# Patient Record
Sex: Male | Born: 1975 | Race: White | Hispanic: No | Marital: Married | Smoking: Never smoker
Health system: Southern US, Community
[De-identification: ages and names within clinical notes are randomized; demographics above are authoritative.]

## PROBLEM LIST (undated history)

## (undated) DIAGNOSIS — M109 Gout, unspecified: Secondary | ICD-10-CM

## (undated) DIAGNOSIS — J4520 Mild intermittent asthma, uncomplicated: Secondary | ICD-10-CM

## (undated) DIAGNOSIS — E669 Obesity, unspecified: Secondary | ICD-10-CM

## (undated) DIAGNOSIS — G4730 Sleep apnea, unspecified: Secondary | ICD-10-CM

## (undated) DIAGNOSIS — G4700 Insomnia, unspecified: Secondary | ICD-10-CM

## (undated) DIAGNOSIS — Z9989 Dependence on other enabling machines and devices: Secondary | ICD-10-CM

## (undated) DIAGNOSIS — R7401 Elevation of levels of liver transaminase levels: Secondary | ICD-10-CM

## (undated) DIAGNOSIS — S76319A Strain of muscle, fascia and tendon of the posterior muscle group at thigh level, unspecified thigh, initial encounter: Secondary | ICD-10-CM

## (undated) DIAGNOSIS — M1990 Unspecified osteoarthritis, unspecified site: Secondary | ICD-10-CM

## (undated) DIAGNOSIS — E785 Hyperlipidemia, unspecified: Secondary | ICD-10-CM

## (undated) DIAGNOSIS — E66812 Obesity, class 2: Secondary | ICD-10-CM

## (undated) HISTORY — DX: Obesity, class 2: E66.812

## (undated) HISTORY — DX: Hyperlipidemia, unspecified: E78.5

## (undated) HISTORY — DX: Obesity, unspecified: E66.9

## (undated) HISTORY — DX: Insomnia, unspecified: G47.00

## (undated) HISTORY — DX: Sleep apnea, unspecified: G47.30

## (undated) HISTORY — DX: Essential (primary) hypertension: I10

## (undated) HISTORY — DX: Elevation of levels of liver transaminase levels: R74.01

## (undated) HISTORY — DX: Mild intermittent asthma, uncomplicated: J45.20

## (undated) HISTORY — PX: COLONOSCOPY W/ POLYPECTOMY: SHX1380

## (undated) HISTORY — DX: Gout, unspecified: M10.9

## (undated) HISTORY — DX: Unspecified osteoarthritis, unspecified site: M19.90

## (undated) HISTORY — DX: Strain of muscle, fascia and tendon of the posterior muscle group at thigh level, unspecified thigh, initial encounter: S76.319A

## (undated) HISTORY — DX: Dependence on other enabling machines and devices: Z99.89

## (undated) HISTORY — PX: WISDOM TOOTH EXTRACTION: SHX21

## (undated) HISTORY — DX: Obstructive sleep apnea (adult) (pediatric): G47.33

---
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
## 2013-07-07 ENCOUNTER — Encounter: Payer: Self-pay | Admitting: Family Medicine

## 2013-07-07 ENCOUNTER — Ambulatory Visit (INDEPENDENT_AMBULATORY_CARE_PROVIDER_SITE_OTHER): Payer: Private Health Insurance - Indemnity | Admitting: Family Medicine

## 2013-07-07 VITALS — BP 148/87 | HR 53 | Temp 98.5°F | Ht 72.0 in | Wt 285.0 lb

## 2013-07-07 DIAGNOSIS — M7660 Achilles tendinitis, unspecified leg: Secondary | ICD-10-CM

## 2013-07-07 DIAGNOSIS — M722 Plantar fascial fibromatosis: Secondary | ICD-10-CM | POA: Insufficient documentation

## 2013-07-07 DIAGNOSIS — M6789 Other specified disorders of synovium and tendon, multiple sites: Secondary | ICD-10-CM

## 2013-07-07 DIAGNOSIS — Z0000 Encounter for general adult medical examination without abnormal findings: Secondary | ICD-10-CM

## 2013-07-07 DIAGNOSIS — B351 Tinea unguium: Secondary | ICD-10-CM | POA: Insufficient documentation

## 2013-07-07 DIAGNOSIS — Z23 Encounter for immunization: Secondary | ICD-10-CM

## 2013-07-07 LAB — LIPID PANEL
CHOLESTEROL: 250 mg/dL — AB (ref 0–200)
HDL: 39.4 mg/dL (ref >39.00)
LDL CALC: 180 mg/dL — AB (ref 0–99)
Total CHOL/HDL Ratio: 6
Triglycerides: 152 mg/dL — ABNORMAL HIGH (ref 0.0–149.0)
VLDL: 30.4 mg/dL (ref 0.0–40.0)

## 2013-07-07 LAB — CBC WITH DIFFERENTIAL/PLATELET
BASOS PCT: 0.3 % (ref 0.0–3.0)
Basophils Absolute: 0 10*3/uL (ref 0.0–0.1)
EOS ABS: 0.1 10*3/uL (ref 0.0–0.7)
Eosinophils Relative: 1.3 % (ref 0.0–5.0)
HEMATOCRIT: 48.1 % (ref 39.0–52.0)
Hemoglobin: 16.3 g/dL (ref 13.0–17.0)
LYMPHS ABS: 1.9 10*3/uL (ref 0.7–4.0)
Lymphocytes Relative: 31.9 % (ref 12.0–46.0)
MCHC: 34 g/dL (ref 30.0–36.0)
MCV: 89.5 fl (ref 78.0–100.0)
MONO ABS: 0.4 10*3/uL (ref 0.1–1.0)
Monocytes Relative: 7.3 % (ref 3.0–12.0)
NEUTROS PCT: 59.2 % (ref 43.0–77.0)
Neutro Abs: 3.6 10*3/uL (ref 1.4–7.7)
Platelets: 253 10*3/uL (ref 150.0–400.0)
RBC: 5.37 Mil/uL (ref 4.22–5.81)
RDW: 12.8 % (ref 11.5–14.6)
WBC: 6.1 10*3/uL (ref 4.5–10.5)

## 2013-07-07 LAB — COMPREHENSIVE METABOLIC PANEL
ALT: 36 U/L (ref 0–53)
AST: 23 U/L (ref 0–37)
Albumin: 4.5 g/dL (ref 3.5–5.2)
Alkaline Phosphatase: 73 U/L (ref 39–117)
BILIRUBIN TOTAL: 1 mg/dL (ref 0.3–1.2)
BUN: 15 mg/dL (ref 6–23)
CO2: 29 meq/L (ref 19–32)
CREATININE: 1.1 mg/dL (ref 0.4–1.5)
Calcium: 9.6 mg/dL (ref 8.4–10.5)
Chloride: 101 mEq/L (ref 96–112)
GFR: 81.4 mL/min (ref >60.00)
Glucose, Bld: 86 mg/dL (ref 70–99)
Potassium: 4.4 mEq/L (ref 3.5–5.1)
SODIUM: 137 meq/L (ref 135–145)
Total Protein: 7.5 g/dL (ref 6.0–8.3)

## 2013-07-07 LAB — TSH: TSH: 0.63 u[IU]/mL (ref 0.35–5.50)

## 2013-07-07 MED ORDER — TERBINAFINE HCL 250 MG PO TABS: 250.0000 mg | ORAL_TABLET | Freq: Every day | ORAL | Status: DC

## 2013-07-07 NOTE — Assessment & Plan Note (Signed)
Very mild and only present in 3 nails. I informed him of the benign nature of this condition and the fact that even if treated successfully with oral antifungal it could return again.  Also reviewed the small chance of hepatic toxicity from this med.  He expressed understanding and wanted to proceed with lamisil 250mg  qd x 3 mo.   Therapeutic expectations and side effect profile of medication discussed today.  Patient's questions answered.  Signs/symptoms to call or return for were reviewed and pt expressed understanding.

## 2013-07-07 NOTE — Progress Notes (Signed)
Pre visit review using our clinic review tool, if applicable. No additional management support is needed unless otherwise documented below in the visit note. 

## 2013-07-07 NOTE — Assessment & Plan Note (Signed)
Mild, no sign of tear. Continue ice, relative rest.

## 2013-07-07 NOTE — Assessment & Plan Note (Signed)
Reviewed age and gender appropriate health maintenance issues (prudent diet, regular exercise, health risks of tobacco and excessive alcohol, use of seatbelts, fire alarms in home, use of sunscreen).  Also reviewed age and gender appropriate health screening as well as vaccine recommendations. Last Td per pt was 2005: Tdap given IM today. HP labs drawn today (fasting).

## 2013-07-07 NOTE — Progress Notes (Signed)
Office Note 07/07/2013  CC:  Chief Complaint  Patient presents with  . Establish Care    HPI:  Cody Cruz is a 38 y.o. White male who is here to establish care, says last CPE was 3 yrs ago. Patient's most recent primary MD: none.  Old records were not reviewed prior to or during today's visit.  Complaint:  Onset of feet pain about 9 yrs ago while working in a [employer], tried [employer].  Was also running at that time and recalls feet hurting when walking but not when he ran.  Saw podiatrist about 2010 and dx'd him with plantar fasciitis, he responded well to conservative tx.  Sx's returned about 6 mo later, "good feet" inserts were recommended and this helped for a while.  Saw podiatrist at [facility] in [place], [place].  He had custom orthotics made and wearing these caused MORE pain.  Over the last 2 yrs, the pain has been intermittent and he has pretty much "just dealt with it".  Worse now for the last month or so.  He has already taken the initiative and has set up an appt to see Dr. Charlann Boxer at [facility] [address] in [place] on 07/16/13. Taking alleve, icing, OTC shoe inserts, and plantar fascia stretching are his current therapies. Has never had an injection of steroids into the region.   Describes the pain more in the arch region, worst with initial steps in his day, better afterwards. Both achilles giving him pain lately as well.   No hx of significant trauma to feet.  BP noted to be slightly up today.  Pt says occasional out-of-office bp checks have shown bp 130s/70s and most measurements in MD office in the past have been normal per his recollection.  Past Medical History  Diagnosis Date  . Mild intermittent asthma     activity induced, typically.  Allergies trigger as a kid.  Marland Kitchen Hyperlipidemia     Mild, no meds required in past.    Past Surgical History  Procedure Laterality Date  . Appendectomy  1989  . Tonsillectomy and adenoidectomy  1979    Family History   Problem Relation Age of Onset  . Arthritis Mother   . Hyperlipidemia Mother   . Hypertension Mother   . Hypertension Father     History   Social History  . Marital Status: Married    Spouse Name: N/A    Number of Children: N/A  . Years of Education: N/A   Occupational History  . Not on file.   Social History Main Topics  . Smoking status: Never Smoker   . Smokeless tobacco: Never Used  . Alcohol Use: Yes  . Drug Use: No  . Sexual Activity: Not on file   Other Topics Concern  . Not on file   Social History Narrative   Married, two children (one son and one daughter).   Education: [name].   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   No tobacco.  Alc 3-4 beers per week avg.  No alc/drugs.   Exercise: CV and wt's at a GYM 3-5 days a week when feet are not hurting him.   MEDS: none except some alleve intermittently  No Known Allergies  ROS Review of Systems  Constitutional: Negative for fever, chills, appetite change and fatigue.  HENT: Negative for congestion, dental problem, ear pain and sore throat.   Eyes: Negative for discharge, redness and visual disturbance.  Respiratory: Negative for cough, chest tightness,  shortness of breath and wheezing.   Cardiovascular: Negative for chest pain, palpitations and leg swelling.  Gastrointestinal: Negative for nausea, vomiting, abdominal pain, diarrhea and blood in stool.  Genitourinary: Negative for dysuria, urgency, frequency, hematuria, flank pain and difficulty urinating.  Musculoskeletal: Negative for arthralgias, back pain, joint swelling, myalgias and neck stiffness.       Bilat plantar feet pain, chronic--see HPI  Skin: Negative for pallor and rash.  Neurological: Negative for dizziness, speech difficulty, weakness and headaches.  Hematological: Negative for adenopathy. Does not bruise/bleed easily.  Psychiatric/Behavioral: Negative for confusion and sleep disturbance. The patient is not  nervous/anxious.     PE; Blood pressure 148/87, pulse 53, temperature 98.5 F (36.9 C), temperature source Temporal, height 6' (1.829 m), weight 285 lb (129.275 kg), SpO2 98.00%. Gen: Alert, well appearing.  Patient is oriented to person, place, time, and situation. AFFECT: pleasant, lucid thought and speech. ENT: Ears: EACs clear, normal epithelium.  TMs with good light reflex and landmarks bilaterally.  Eyes: no injection, icteris, swelling, or exudate.  EOMI, PERRLA. Nose: no drainage or turbinate edema/swelling.  No injection or focal lesion.  Mouth: lips without lesion/swelling.  Oral mucosa pink and moist.  Dentition intact and without obvious caries or gingival swelling.  Oropharynx without erythema, exudate, or swelling.  Neck: supple/nontender.  No LAD, mass, or TM.  Carotid pulses 2+ bilaterally, without bruits. CV: RRR, no m/r/g.   LUNGS: CTA bilat, nonlabored resps, good aeration in all lung fields. ABD: soft, NT, ND, BS normal.  No hepatospenomegaly or mass.  No bruits. EXT: no clubbing, cyanosis, or edema.  Musculoskeletal: no joint swelling, erythema, warmth, or tenderness.  ROM of all joints intact. Skin - no sores or suspicious lesions or rashes or color changes FEET: distal achilles tendon/calcaneal apophysis tenderness--mild, bilateral.  No swelling/deformity.  Mild pain in distal achilles region bilat with plantar flexion but good strength and ROM of ankle. Both feet with tenderness to mid portion of plantar fascia in medial aspect of foot.  No heel tenderness, no metatarsal tenderness. Toenails: Right 5th toenail and right 1st toenail with small amount of white substance under nail. Left 1st toenail with a very small amount of white substance under nail, with some mild microtrauma changes to the nail. Soles of feet with pinkish, flaky rash.  No macerated areas.  Some scattered calluses present.   Pertinent labs:  None today  ASSESSMENT AND PLAN:   New pt: no old  records to obtain.  Health maintenance examination Reviewed age and gender appropriate health maintenance issues (prudent diet, regular exercise, health risks of tobacco and excessive alcohol, use of seatbelts, fire alarms in home, use of sunscreen).  Also reviewed age and gender appropriate health screening as well as vaccine recommendations. Last Td per pt was 2005: Tdap given IM today. HP labs drawn today (fasting).   Plantar fasciitis, bilateral Continue current measures. He has already arranged a visit with the appropriate specialist for this problem, Dr. Charlann Boxer, for later this month.  Achilles tendon pain Mild, no sign of tear. Continue ice, relative rest.  Onychomycosis Very mild and only present in 3 nails. I informed him of the benign nature of this condition and the fact that even if treated successfully with oral antifungal it could return again.  Also reviewed the small chance of hepatic toxicity from this med.  He expressed understanding and wanted to proceed with lamisil 250mg  qd x 3 mo.   Therapeutic expectations and side effect profile of  medication discussed today.  Patient's questions answered.  Signs/symptoms to call or return for were reviewed and pt expressed understanding.   An After Visit Summary was printed and given to the patient.  Return in about 1 year (around 07/08/2014) for annual CPE with fasting labs the week prior.

## 2013-07-07 NOTE — Assessment & Plan Note (Signed)
Continue current measures. He has already arranged a visit with the appropriate specialist for this problem, Dr. Charlann Boxer, for later this month.

## 2013-07-08 ENCOUNTER — Encounter: Payer: Self-pay | Admitting: Family Medicine

## 2013-07-16 ENCOUNTER — Other Ambulatory Visit (INDEPENDENT_AMBULATORY_CARE_PROVIDER_SITE_OTHER): Payer: Private Health Insurance - Indemnity

## 2013-07-16 ENCOUNTER — Ambulatory Visit (INDEPENDENT_AMBULATORY_CARE_PROVIDER_SITE_OTHER): Payer: Private Health Insurance - Indemnity | Admitting: Family Medicine

## 2013-07-16 ENCOUNTER — Encounter: Payer: Self-pay | Admitting: Family Medicine

## 2013-07-16 VITALS — BP 146/90 | HR 79 | Ht 73.0 in | Wt 288.0 lb

## 2013-07-16 DIAGNOSIS — M722 Plantar fascial fibromatosis: Secondary | ICD-10-CM

## 2013-07-16 DIAGNOSIS — M6789 Other specified disorders of synovium and tendon, multiple sites: Secondary | ICD-10-CM

## 2013-07-16 DIAGNOSIS — M7660 Achilles tendinitis, unspecified leg: Secondary | ICD-10-CM

## 2013-07-16 MED ORDER — NITROGLYCERIN 0.2 MG/HR TD PT24: MEDICATED_PATCH | TRANSDERMAL | Status: DC

## 2013-07-16 MED ORDER — MELOXICAM 15 MG PO TABS: 15.0000 mg | ORAL_TABLET | Freq: Every day | ORAL | Status: DC

## 2013-07-16 NOTE — Progress Notes (Signed)
[name] [address], [place] [address] Phone: [phone] Subjective:    I'm seeing this patient by the request  of:  MCGOWEN,PHILIP H, MD   CC: Heel pain  [id number] Cody Cruz is a 38 y.o. male coming in with complaint of bilateral heel pain. Patient states it hurts more in the morning it with first steps after sitting for a long amount of time. She describes the pain as a searing sharp pain. Patient states he has had this intermittently over the course the last 5 years. Patient has had custom orthotics made by the [facility] previously. Patient states that these only lasted a couple months and then broke. Patient has tried different shoes as well as different exercises without any significant improvement. Patient has even tried an injection which gave him some relief but then unfortunately come back. Denies any numbness or tingling. Denies any nighttime awakening. Patient rates the pain is 6/10 in severity. Can affect with his daily activities.     Past medical history, social, surgical and family history all reviewed in electronic medical record.   Review of Systems: No headache, visual changes, nausea, vomiting, diarrhea, constipation, dizziness, abdominal pain, skin rash, fevers, chills, night sweats, weight loss, swollen lymph nodes, body aches, joint swelling, muscle aches, chest pain, shortness of breath, mood changes.   Objective Blood pressure 146/90, pulse 79, height 6\' 1"  (1.854 m), weight 288 lb (130.636 kg), SpO2 96.00%.  General: No apparent distress alert and oriented x3 mood and affect normal, dressed appropriately. Mildly overweight.  HEENT: Pupils equal, extraocular movements intact  Respiratory: Patient's speak in full sentences and does not appear short of breath  Cardiovascular: No lower extremity edema, non tender, no erythema  Skin: Warm dry intact with no signs of infection or rash on extremities or on axial  skeleton.  Abdomen: Soft nontender  Neuro: Cranial nerves II through XII are intact, neurovascularly intact in all extremities with 2+ DTRs and 2+ pulses.  Lymph: No lymphadenopathy of posterior or anterior cervical chain or axillae bilaterally.  Gait normal with good balance and coordination.  MSK:  Non tender with full range of motion and good stability and symmetric strength and tone of shoulders, elbows, wrist, hip, knee and ankles bilaterally.  Normal inspection with no visable or palpable fat pad atrophy and no visible swelling/erythema. Bilateral foot exam shows the patient does have significant overpronation the hindfoot as well as spurring between the first and second toes showing some mild breakdown of the transverse arch Patient is tender at medial insertion of plantar fascia into calcaneus. Great toe motion: Mild hallux rigidus bilaterally Arch shape: Normal Other foot breakdown: Prideaux the transverse arch as stated above  MSK US performed of: Right ankle This study was ordered, performed, and interpreted by Charlann Boxer D.O.  Foot/Ankle:   All structures visualized.   Talar dome unremarkable  Ankle mortise without effusion. Peroneus longus and brevis tendons unremarkable on long and transverse views without sheath effusions. Posterior tibialis, flexor hallucis longus, and flexor digitorum longus tendons unremarkable on long and transverse views without sheath effusions. Achilles tendon visualized along length of tendon and unremarkable on long and transverse views without sheath effusion. Anterior Talofibular Ligament and Calcaneofibular Ligaments unremarkable and intact. Deltoid Ligament unremarkable and intact. Plantar fascia is a large hypoechoic areas but no true tear appreciated. Patient's plantar fasciitis measures 1.19 cm on the right as well as 1.09 cm on the left. l.  IMPRESSION: Plantar  fasciitis       Impression and Recommendations:     This case required  medical decision making of moderate complexity.

## 2013-07-16 NOTE — Patient Instructions (Addendum)
Nice to meet you On step both feet, drop heels as far as they can go then up on toes. Hold 2 seconds down slow for count of 4 seconds, repeat 30 reps.  1 set daily first week, 2 sets daily second week, 3 sets daily thereafter.  Rigid sole shoes (dansko, Merrell, [place]) Spenco orthotics at [employer] or [employer] 20 minutes 2 times a day.  Look at handout of stretches to try to do daily.  Meloxicam daily for 10 days then as needed.  Come back in 3-4 weeks.   Run 2 times a week.  Start a walk-run progression: - I would like you to do line drills (try to keep foot on a line when jogging). - Initially start one minute walking than one minute running for 20 mins in the first week,   then 25 mins during the second week, then 30 mins afterwards.  Once you have reached 30 mins: - Run 2 mins, then walk 1 min. -Then run 3 mins, and walk 1 min. -Then run 4 mins, and walk 1 min. -Then run 5 mins, and walk 1 min. -Slowly build up weekly to running 30 mins nonstop.  If painful at any of the steps, back up one step.   Nitroglycerin Protocol   Apply 1/4 nitroglycerin patch to affected area daily.  Change position of patch within the affected area every 24 hours.  You may experience a headache during the first 1-2 weeks of using the patch, these should subside.  If you experience headaches after beginning nitroglycerin patch treatment, you may take your preferred over the counter pain reliever.  Another side effect of the nitroglycerin patch is skin irritation or rash related to patch adhesive.  Please notify our office if you develop more severe headaches or rash, and stop the patch.  Tendon healing with nitroglycerin patch may require 12 to 24 weeks depending on the extent of injury.  Men should not use if taking Viagra, Cialis, or Levitra.   Do not use if you have migraines or rosacea.

## 2013-07-16 NOTE — Assessment & Plan Note (Signed)
Patient does have plantar fasciitis bilaterally. Patient will try over-the-counter orthotics, nitroglycerin protocol because this has been such a long process Cody Cruz failed significant amount conservative therapy including physical therapy previously. Home exercise program was given to him today as well as an icing protocol that is different. Patient try these different interventions and come back in 3-4 weeks. If he continues to have pain we'll do a custom orthotic.

## 2013-08-06 ENCOUNTER — Ambulatory Visit: Payer: Private Health Insurance - Indemnity | Admitting: Family Medicine

## 2013-08-07 ENCOUNTER — Other Ambulatory Visit (INDEPENDENT_AMBULATORY_CARE_PROVIDER_SITE_OTHER): Payer: Private Health Insurance - Indemnity

## 2013-08-07 ENCOUNTER — Encounter: Payer: Self-pay | Admitting: Family Medicine

## 2013-08-07 ENCOUNTER — Ambulatory Visit (INDEPENDENT_AMBULATORY_CARE_PROVIDER_SITE_OTHER): Payer: Private Health Insurance - Indemnity | Admitting: Family Medicine

## 2013-08-07 VITALS — BP 134/84 | HR 52 | Ht 72.0 in | Wt 286.0 lb

## 2013-08-07 DIAGNOSIS — M722 Plantar fascial fibromatosis: Secondary | ICD-10-CM

## 2013-08-07 NOTE — Patient Instructions (Signed)
It is good to see you Continue the meloxicam if you want.  Or when discomfort take daily for 3 days.  Ice at end of day and after a lot of activity.  Continue the exercises but really focus on the step one Cross train with swimming, biking or elliptical.  Patch up to 1/2 patch daily, remember possible side effects.  Come back in 4 week. Hopefully start running then.

## 2013-08-07 NOTE — Progress Notes (Signed)
[facility] [address] [place], [place] [address] Phone: [phone] Subjective:     CC: Heel pain follow up  EXH:BZJIRCVELF [name] is a 38 y.o. male coming in with complaint of bilateral heel pain. Patient was seen previously and was diagnosed with plantar fasciitis. Patient's was given reconditions over-the-counter insoles, shoe choices, started on a nitroglycerin patch as well as anti-inflammatories. Patient states that he is approximately 60% better. Patient has been doing exercises, copy over-the-counter insoles, and is wearing a nitroglycerin patch without any significant side effects. Patient is no longer taking the anti-inflammatory a regular basis. Patient states that at the end of a long day he can have some mild discomfort. Patient has been icing at night as well as in the morning. Not doing the stretching on a regular basis. No new symptoms noted. Patient states that the pain is now in the right foot only.     Past medical history, social, surgical and family history all reviewed in electronic medical record.   Review of Systems: No headache, visual changes, nausea, vomiting, diarrhea, constipation, dizziness, abdominal pain, skin rash, fevers, chills, night sweats, weight loss, swollen lymph nodes, body aches, joint swelling, muscle aches, chest pain, shortness of breath, mood changes.   Objective Blood pressure 134/84, pulse 52, height 6' (1.829 m), weight 286 lb (129.729 kg), SpO2 98.00%.  General: No apparent distress alert and oriented x3 mood and affect normal, dressed appropriately. Mildly overweight.  HEENT: Pupils equal, extraocular movements intact  Respiratory: Patient's speak in full sentences and does not appear short of breath  Cardiovascular: No lower extremity edema, non tender, no erythema  Skin: Warm dry intact with no signs of infection or rash on extremities or on axial skeleton.  Abdomen: Soft nontender  Neuro:  Cranial nerves II through XII are intact, neurovascularly intact in all extremities with 2+ DTRs and 2+ pulses.  Lymph: No lymphadenopathy of posterior or anterior cervical chain or axillae bilaterally.  Gait normal with good balance and coordination.  MSK:  Non tender with full range of motion and good stability and symmetric strength and tone of shoulders, elbows, wrist, hip, knee and ankles bilaterally.  Normal inspection with no visable or palpable fat pad atrophy and no visible swelling/erythema. Bilateral foot exam shows the patient does have significant overpronation the hindfoot as well as spurring between the first and second toes showing some mild breakdown of the transverse arch Patient is tender minimally tender to palpation of the right medial calcaneal area. Great toe motion: Mild hallux rigidus bilaterally Arch shape: Normal Other foot breakdown: Prideaux the transverse arch as stated above  MSK US performed of: Right ankle This study was ordered, performed, and interpreted by Charlann Boxer D.O.  Foot/Ankle:   All structures visualized.   Talar dome unremarkable  Ankle mortise without effusion. Peroneus longus and brevis tendons unremarkable on long and transverse views without sheath effusions. Posterior tibialis, flexor hallucis longus, and flexor digitorum longus tendons unremarkable on long and transverse views without sheath effusions. Achilles tendon visualized along length of tendon and unremarkable on long and transverse views without sheath effusion. Anterior Talofibular Ligament and Calcaneofibular Ligaments unremarkable and intact. Deltoid Ligament unremarkable and intact. Plantar fascia is a large hypoechoic areas but no true tear appreciated. Patient's plantar fasciitis measures 0.88 cm on the right as well as .68 cm on the left.   IMPRESSION: Plantar fasciitis of the right foot and improving.    Impression and Recommendations:  This case required medical  decision making of moderate complexity.

## 2013-08-07 NOTE — Assessment & Plan Note (Signed)
Patient has been making some mild improvement. We discussed continuing the icing but only at night. We will have patient take for meloxicam three-day straight whenever he has any discomfort. Patient will actually increases nitroglycerin patch and see if he can do better. We discussed the importance of stretching the posterior capsule. Patient will continue the orthotics. Patient will come back again in 4 weeks. Lungs patient is doing very well we'll continue with conservative therapy. Patient was having pain with running to have him stop running and cross train at the time. Hopefully at followup as long as he is feeling better we'll start him back on the running regimen.  Spent greater than 25 minutes with patient face-to-face and had greater than 50% of counseling including as described above in assessment and plan.

## 2013-09-04 ENCOUNTER — Ambulatory Visit: Payer: Private Health Insurance - Indemnity | Admitting: Family Medicine

## 2013-09-07 ENCOUNTER — Ambulatory Visit (INDEPENDENT_AMBULATORY_CARE_PROVIDER_SITE_OTHER): Payer: Private Health Insurance - Indemnity | Admitting: Family Medicine

## 2013-09-07 ENCOUNTER — Encounter: Payer: Self-pay | Admitting: Family Medicine

## 2013-09-07 ENCOUNTER — Other Ambulatory Visit (INDEPENDENT_AMBULATORY_CARE_PROVIDER_SITE_OTHER): Payer: Private Health Insurance - Indemnity

## 2013-09-07 VITALS — BP 142/82 | HR 75 | Ht 72.0 in | Wt 289.0 lb

## 2013-09-07 DIAGNOSIS — M722 Plantar fascial fibromatosis: Secondary | ICD-10-CM

## 2013-09-07 NOTE — Assessment & Plan Note (Signed)
Overall improvement seen, almost to full healing.  Discussed continuing the exercises as well as the icing regularly. Patient The over-the-counter orthotics and can benefit from custom orthotics which we can discuss at followup. Patient will be called and we do get a brace and wear pneumatic compression the ankle be helpful. Patient will continue with over-the-counter medications. Patient will come back and see me again in 4-6 weeks. He continued to have pain he may be a candidate for custom orthotics.  Spent greater than 25 minutes with patient face-to-face and had greater than 50% of counseling including as described above in assessment and plan.

## 2013-09-07 NOTE — Progress Notes (Signed)
[name] [address] [place], [place] [address] Phone: [phone] Subjective:     CC: Heel pain follow up  [id number] Cody Cruz is a 38 y.o. male coming in with complaint of bilateral heel pain. Patient was seen previously and was diagnosed with plantar fasciitis. Patient states that the left side is completely resolved he continues to have pain on the right side. Patient states though it is another 20-30% better than he was previously. Patient states only when he stands for a long amount of time after the standing he has some discomfort. Patient states that in the morning though he no longer has the pain. Patient is no longer continued the nitroglycerin patch though because he felt that after an injury from a softball he is having increasing swelling. Denies any side effects. Patient continues he icing as well as a home exercises on a fairly regular basis. Patient has not taken any anti-inflammatories.     Past medical history, social, surgical and family history all reviewed in electronic medical record.   Review of Systems: No headache, visual changes, nausea, vomiting, diarrhea, constipation, dizziness, abdominal pain, skin rash, fevers, chills, night sweats, weight loss, swollen lymph nodes, body aches, joint swelling, muscle aches, chest pain, shortness of breath, mood changes.   Objective Blood pressure 142/82, pulse 75, height 6' (1.829 m), weight 289 lb (131.09 kg), SpO2 97.00%.  General: No apparent distress alert and oriented x3 mood and affect normal, dressed appropriately. Mildly overweight.  HEENT: Pupils equal, extraocular movements intact  Respiratory: Patient's speak in full sentences and does not appear short of breath  Cardiovascular: No lower extremity edema, non tender, no erythema  Skin: Warm dry intact with no signs of infection or rash on extremities or on axial skeleton.  Abdomen: Soft nontender  Neuro: Cranial nerves II  through XII are intact, neurovascularly intact in all extremities with 2+ DTRs and 2+ pulses.  Lymph: No lymphadenopathy of posterior or anterior cervical chain or axillae bilaterally.  Gait normal with good balance and coordination.  MSK:  Non tender with full range of motion and good stability and symmetric strength and tone of shoulders, elbows, wrist, hip, knee and ankles bilaterally.  Normal inspection with no visable or palpable fat pad atrophy and no visible swelling/erythema. Bilateral foot exam shows the patient does have significant overpronation the hindfoot as well as splaying between the first and second toes showing some mild breakdown of the transverse arch Patient is tender minimally tender to palpation of the right medial calcaneal area but continued improvement from previous exam.  Great toe motion: Mild hallux rigidus bilaterally Arch shape: Normal Other foot breakdown: Prideaux the transverse arch as stated above but non tender.   MSK US performed of: Right ankle This study was ordered, performed, and interpreted by Charlann Boxer D.O.  Foot/Ankle:   All structures visualized.   Talar dome unremarkable  Ankle mortise without effusion. Peroneus longus and brevis tendons unremarkable on long and transverse views without sheath effusions. Posterior tibialis, flexor hallucis longus, and flexor digitorum longus tendons unremarkable on long and transverse views without sheath effusions. Achilles tendon visualized along length of tendon and unremarkable on long and transverse views without sheath effusion. Anterior Talofibular Ligament and Calcaneofibular Ligaments unremarkable and intact. Deltoid Ligament unremarkable and intact. Plantar fascia is a large hypoechoic areas but no true tear appreciated. Patient's plantar fasciitis measures 0.70 cm on the right as well as .62 cm on the left.  IMPRESSION: Plantar fasciitis of the right foot with improvement from 0.88 to 0.7cm. .      Impression and Recommendations:     This case required medical decision making of moderate complexity.

## 2013-09-07 NOTE — Patient Instructions (Signed)
You are doing great Continue to be off the nitro We will call you when we get the brace in.  Ice bath is still your friend Continue the exercises 3 times a wee at least Start a walk-run progression: Only run 2 times a week.  - Initially start one minute walking than one minute running for 20 mins in the first week,   then 25 mins during the second week, then 30 mins afterwards.  Once you have reached 30 mins: - Run 2 mins, then walk 1 min. -Then run 3 mins, and walk 1 min. -Then run 4 mins, and walk 1 min. -Then run 5 mins, and walk 1 min. -Slowly build up weekly to running 30 mins nonstop.  If painful at any of the steps, back up one step.  Come back in 4-6 weeks.

## 2013-10-30 ENCOUNTER — Encounter: Payer: Self-pay | Admitting: Family Medicine

## 2013-10-30 ENCOUNTER — Other Ambulatory Visit (INDEPENDENT_AMBULATORY_CARE_PROVIDER_SITE_OTHER): Payer: Private Health Insurance - Indemnity

## 2013-10-30 ENCOUNTER — Ambulatory Visit: Payer: Private Health Insurance - Indemnity | Admitting: Family Medicine

## 2013-10-30 ENCOUNTER — Ambulatory Visit (INDEPENDENT_AMBULATORY_CARE_PROVIDER_SITE_OTHER): Payer: Private Health Insurance - Indemnity | Admitting: Family Medicine

## 2013-10-30 VITALS — BP 118/76 | HR 75 | Ht 72.0 in | Wt 291.0 lb

## 2013-10-30 DIAGNOSIS — M722 Plantar fascial fibromatosis: Secondary | ICD-10-CM

## 2013-10-30 DIAGNOSIS — M2140 Flat foot [pes planus] (acquired), unspecified foot: Secondary | ICD-10-CM

## 2013-10-30 DIAGNOSIS — M2142 Flat foot [pes planus] (acquired), left foot: Secondary | ICD-10-CM | POA: Insufficient documentation

## 2013-10-30 DIAGNOSIS — M2141 Flat foot [pes planus] (acquired), right foot: Secondary | ICD-10-CM | POA: Insufficient documentation

## 2013-10-30 NOTE — Assessment & Plan Note (Signed)
Patient continues to have difficulty with regular increase in running. Patient is able to do daily activities without any significant discomfort and is doing all conservative measures we've discussed previously. I do feel that patient may respond well to a pneumatic compression brace that was given today by me today. This will help relieve some of the longitudinal arch. It appears the patient's plantar fasciitis resolved at this time on ultrasound. Pain is also now more when he starts walking after running which would to be more secondary to metatarsalgia. Patient will try these interventions and come back in 2 weeks. Continuing to have pain we'll need to make orthotics.

## 2013-10-30 NOTE — Progress Notes (Signed)
[facility] [place], [place] [address] Phone: [phone] Subjective:     CC: Heel pain follow up  Cody Cruz is a 38 y.o. male coming in with complaint of bilateral heel pain. Patient was seen previously and was diagnosed with plantar fasciitis. Patient was improving slowly 6 weeks ago. Patient was to continue with the home exercises, icing, and was titrating off of a nitroglycerin. Patient was turning a running regimen as well. Patient states that's overall with daily activities he has no pain but anytime he tries to increase his running he has pain after running especially when he slows down and does locking. Patient states that it hurts more on the medial aspect of the heel still. Only the right foot is still involved. Patient states that anytime he tries to increase he has pain and has to stop for approximately 4-5 days. Denies any more of the pain when he wakes up at the morning. Modalities that he is doing does not seem to make any other great benefit. Nighttime awakening.    Past medical history, social, surgical and family history all reviewed in electronic medical record.   Review of Systems: No headache, visual changes, nausea, vomiting, diarrhea, constipation, dizziness, abdominal pain, skin rash, fevers, chills, night sweats, weight loss, swollen lymph nodes, body aches, joint swelling, muscle aches, chest pain, shortness of breath, mood changes.   Objective Blood pressure 118/76, pulse 75, height 6' (1.829 m), weight 291 lb (131.997 kg), SpO2 97.00%.  General: No apparent distress alert and oriented x3 mood and affect normal, dressed appropriately. Mildly overweight.  HEENT: Pupils equal, extraocular movements intact  Respiratory: Patient's speak in full sentences and does not appear short of breath  Cardiovascular: No lower extremity edema, non tender, no erythema  Skin: Warm dry intact with no signs of infection or  rash on extremities or on axial skeleton.  Abdomen: Soft nontender  Neuro: Cranial nerves II through XII are intact, neurovascularly intact in all extremities with 2+ DTRs and 2+ pulses.  Lymph: No lymphadenopathy of posterior or anterior cervical chain or axillae bilaterally.  Gait normal with good balance and coordination.  MSK:  Non tender with full range of motion and good stability and symmetric strength and tone of shoulders, elbows, wrist, hip, knee and ankles bilaterally.  Normal inspection with no visable or palpable fat pad atrophy and no visible swelling/erythema. Bilateral foot exam shows the patient does have significant overpronation the hindfoot as well as splaying between the first and second toes showing some mild breakdown of the transverse arch Patient is tender minimally tender to palpation of the right medial calcaneal area but continued improvement from previous exam.  Great toe motion: Mild hallux rigidus bilaterally Arch shape: Normal but very minimal break in the longitudinal arch Other foot breakdown: Prideaux the transverse arch as stated above but non tender.   MSK US performed of: Right ankle This study was ordered, performed, and interpreted by Charlann Boxer D.O.  Foot/Ankle:   All structures visualized.   Talar dome unremarkable  Ankle mortise without effusion. Peroneus longus and brevis tendons unremarkable on long and transverse views without sheath effusions. Posterior tibialis, flexor hallucis longus, and flexor digitorum longus tendons unremarkable on long and transverse views without sheath effusions. Achilles tendon visualized along length of tendon and unremarkable on long and transverse views without sheath effusion. Anterior Talofibular Ligament and Calcaneofibular Ligaments unremarkable and intact. Deltoid Ligament unremarkable and intact. Plantar fascia continues  to have no enlargement swelling or any signs of tear.   IMPRESSION: Appears to have  resolved plantar fasciitis    Impression and Recommendations:     This case required medical decision making of moderate complexity.

## 2013-10-30 NOTE — Patient Instructions (Signed)
Good to see you Ice will always be your friend.  Continue the stretches overall.  Try the new brace and can start the running progression if you want.  See you soon.

## 2013-10-30 NOTE — Assessment & Plan Note (Signed)
Still overall the making complete recovery. Encourage him to continue the exercises as well as icing. Patient was given a pneumatic compression sleeve today that I think will be beneficial. Patient will try this for the next 2 weeks and then likely come back for another evaluation. At continuing to have trouble we do need to make custom orthotics.   Spent greater than 25 minutes with patient face-to-face and had greater than 50% of counseling including as described above in assessment and plan.

## 2013-11-12 ENCOUNTER — Ambulatory Visit (INDEPENDENT_AMBULATORY_CARE_PROVIDER_SITE_OTHER): Payer: Private Health Insurance - Indemnity | Admitting: Family Medicine

## 2013-11-12 ENCOUNTER — Encounter: Payer: Self-pay | Admitting: Family Medicine

## 2013-11-12 VITALS — BP 118/84 | HR 71 | Ht 72.0 in | Wt 291.0 lb

## 2013-11-12 DIAGNOSIS — M722 Plantar fascial fibromatosis: Secondary | ICD-10-CM

## 2013-11-12 NOTE — Patient Instructions (Signed)
Good to see you When you wear orthotics wear them 2 hours the first day then 1 hour more daily.  Continue with the running If you want another brace at any point just tell us but try the orthotics first See you when you need me.

## 2013-11-12 NOTE — Progress Notes (Signed)
  [facility] [address] [place],  [address] Phone: [phone] Subjective:     CC: Heel pain follow up  [id number] Cody Cruz is a 38 y.o. male coming in with complaint of bilateral heel pain. Patient has been wearing a pneumatic compression and is doing very well. Patient states that the pain is significantly less. Patient has started on the running progression and is doing fairly well. Patient is also going to the gym 3-4 times a week and states that only minimal pain. Overall patient is fairly happy with the results.    Past medical history, social, surgical and family history all reviewed in electronic medical record.   Review of Systems: No headache, visual changes, nausea, vomiting, diarrhea, constipation, dizziness, abdominal pain, skin rash, fevers, chills, night sweats, weight loss, swollen lymph nodes, body aches, joint swelling, muscle aches, chest pain, shortness of breath, mood changes.   Objective Blood pressure 118/84, pulse 71, height 6' (1.829 m), weight 291 lb (131.997 kg), SpO2 95.00%.  General: No apparent distress alert and oriented x3 mood and affect normal, dressed appropriately. Mildly overweight.  HEENT: Pupils equal, extraocular movements intact  Respiratory: Patient's speak in full sentences and does not appear short of breath  Cardiovascular: No lower extremity edema, non tender, no erythema  Skin: Warm dry intact with no signs of infection or rash on extremities or on axial skeleton.  Abdomen: Soft nontender  Neuro: Cranial nerves II through XII are intact, neurovascularly intact in all extremities with 2+ DTRs and 2+ pulses.  Lymph: No lymphadenopathy of posterior or anterior cervical chain or axillae bilaterally.  Gait normal with good balance and coordination.  MSK:  Non tender with full range of motion and good stability and symmetric strength and tone of shoulders, elbows, wrist, hip, knee and ankles  bilaterally.  Normal inspection with no visable or palpable fat pad atrophy and no visible swelling/erythema. Bilateral foot exam shows the patient does have significant overpronation the hindfoot as well as splaying between the first and second toes showing some mild breakdown of the transverse arch Patient is tender minimally tender to palpation of the right medial calcaneal area but continued improvement from previous exam.  Great toe motion: Mild hallux rigidus bilaterally Arch shape: Normal but very minimal break in the longitudinal arch Other foot breakdown: Prideaux the transverse arch as stated above but non tender.    Patient was fitted for a : standard, cushioned, semi-rigid orthotic. The orthotic was heated and afterward the patient stood on the orthotic blank positioned on the orthotic stand. The patient was positioned in subtalar neutral position and 10 degrees of ankle dorsiflexion in a weight bearing stance. After completion of molding, a stable base was applied to the orthotic blank. The blank was ground to a stable position for weight bearing. Size: 10 Base: Blue EVA Additional Posting and Padding: None The patient ambulated these, and they were very comfortable.       Impression and Recommendations:

## 2013-11-12 NOTE — Assessment & Plan Note (Signed)
Orthotics made today. We discussed slowly titrating on the amount of time warned. She'll patient proper way to orthotics into to shoes. We discussed continuing the exercises and continuing with running protocol. The patient continues to do well he'll followup in 3-4 weeks.  I spent 45 minutes with this patient, greater than 50% was face-to-face time counseling regarding the below diagnosis

## 2013-11-13 ENCOUNTER — Ambulatory Visit: Payer: Private Health Insurance - Indemnity | Admitting: Family Medicine

## 2014-04-21 ENCOUNTER — Ambulatory Visit (INDEPENDENT_AMBULATORY_CARE_PROVIDER_SITE_OTHER): Payer: 59 | Admitting: Family Medicine

## 2014-04-21 ENCOUNTER — Encounter: Payer: Self-pay | Admitting: Family Medicine

## 2014-04-21 VITALS — BP 140/84 | HR 78 | Ht 72.0 in | Wt 291.0 lb

## 2014-04-21 DIAGNOSIS — M9906 Segmental and somatic dysfunction of lower extremity: Secondary | ICD-10-CM

## 2014-04-21 DIAGNOSIS — S93311A Subluxation of tarsal joint of right foot, initial encounter: Secondary | ICD-10-CM

## 2014-04-21 DIAGNOSIS — M999 Biomechanical lesion, unspecified: Secondary | ICD-10-CM | POA: Insufficient documentation

## 2014-04-21 NOTE — Progress Notes (Signed)
Pre visit review using our clinic review tool, if applicable. No additional management support is needed unless otherwise documented below in the visit note. 

## 2014-04-21 NOTE — Patient Instructions (Signed)
Good to see you You have a drop cuboid.  We got it back in today.  Ice still after activity Consider taping the midfoot and see if this helps.  I do not think it is the orthotics.  Consider Elisabeth Cara .  See me again when you need me.

## 2014-04-21 NOTE — Assessment & Plan Note (Signed)
Patient did respond very well to osteopathic manipulation. We discussed potential taping and we discussed other shoes that could be beneficial. We discussed icing and home exercises. We discussed what activities potentially avoid. Patient may need this from time to time we tried to teach him some manual manipulation techniques. Patient can follow-up with me more on an as-needed basis with this not being the plantar fasciitis.

## 2014-04-21 NOTE — Progress Notes (Signed)
  [facility] [place] [address] [place], [place] [address] Phone: [phone] Subjective:     CC: Heel pain follow up  [id number] Cody Cruz is a 39 y.o. male coming in with complaint of bilateral heel pain. Patient was found to have more of a plantar fasciitis. Patient does have severe pes planus bilaterally as well as. Patient was doing significantly better and was doing the running progression and forcefully started having more of a mid foot pain especially on the right foot. Patient continues to go to cross fit on a regular regimen. Patient states though that if he does too much activity or any type of running he does have pain sometimes. Patient states it usually does not stop him from activities but then for 2-3 days afterwards he is unable to do a significant amount.  Patient has tried changing shoes and does use his orthotics from time to time. Patient has not notice any improvement either way.    Past medical history, social, surgical and family history all reviewed in electronic medical record.   Review of Systems: No headache, visual changes, nausea, vomiting, diarrhea, constipation, dizziness, abdominal pain, skin rash, fevers, chills, night sweats, weight loss, swollen lymph nodes, body aches, joint swelling, muscle aches, chest pain, shortness of breath, mood changes.   Objective Blood pressure 140/84, pulse 78, height 6' (1.829 m), weight 291 lb (131.997 kg), SpO2 93 %.  General: No apparent distress alert and oriented x3 mood and affect normal, dressed appropriately. Mildly overweight.  HEENT: Pupils equal, extraocular movements intact  Respiratory: Patient's speak in full sentences and does not appear short of breath  Cardiovascular: No lower extremity edema, non tender, no erythema  Skin: Warm dry intact with no signs of infection or rash on extremities or on axial skeleton.  Abdomen: Soft nontender  Neuro: Cranial nerves II through XII are  intact, neurovascularly intact in all extremities with 2+ DTRs and 2+ pulses.  Lymph: No lymphadenopathy of posterior or anterior cervical chain or axillae bilaterally.  Gait normal with good balance and coordination.  MSK:  Non tender with full range of motion and good stability and symmetric strength and tone of shoulders, elbows, wrist, hip, knee and ankles bilaterally.  Normal inspection with no visable or palpable fat pad atrophy and no visible swelling/erythema. Bilateral foot exam shows the patient does have significant overpronation the hindfoot as well as splaying between the first and second toes showing some mild breakdown of the transverse arch patient also has significant rigid midfoot Patient though is tender to palpation over the inferior aspect of the cuboid bone on the right foot.  After verbal consent patient did have a plantar with done and patient did have an audible pop with relocation of the cuboid.     Impression and Recommendations:

## 2014-04-21 NOTE — Assessment & Plan Note (Signed)
Decision today to treat with OMT was based on Physical Exam  After verbal consent patient was treated with HVLA techniques in right foot areas  Patient tolerated the procedure well with improvement in symptoms  Patient given exercises, stretches and lifestyle modifications  See medications in patient instructions if given  Patient will follow up in 2 weeks if not perfect

## 2014-08-19 ENCOUNTER — Ambulatory Visit: Payer: 59 | Admitting: Family Medicine

## 2014-08-25 ENCOUNTER — Encounter: Payer: Self-pay | Admitting: Family Medicine

## 2014-08-25 ENCOUNTER — Ambulatory Visit (INDEPENDENT_AMBULATORY_CARE_PROVIDER_SITE_OTHER): Payer: 59 | Admitting: Family Medicine

## 2014-08-25 VITALS — BP 128/86 | HR 68 | Ht 72.0 in | Wt 289.0 lb

## 2014-08-25 DIAGNOSIS — M9906 Segmental and somatic dysfunction of lower extremity: Secondary | ICD-10-CM | POA: Diagnosis not present

## 2014-08-25 DIAGNOSIS — M999 Biomechanical lesion, unspecified: Secondary | ICD-10-CM

## 2014-08-25 DIAGNOSIS — S93311A Subluxation of tarsal joint of right foot, initial encounter: Secondary | ICD-10-CM | POA: Diagnosis not present

## 2014-08-25 NOTE — Progress Notes (Signed)
Pre visit review using our clinic review tool, if applicable. No additional management support is needed unless otherwise documented below in the visit note. 

## 2014-08-25 NOTE — Progress Notes (Signed)
  [name] [place], [place] [address] Phone: [phone] Subjective:     CC: Heel pain follow up  [id number] Cody Cruz is a 39 y.o. male coming in with complaint of bilateral heel pain. Patient was found to have more of a plantar fasciitis. Patient does have severe pes planus bilaterally as well as. Patient has been doing very well with running but then was found to have actually a dropped cuboid of the right foot previously. Patient states overall he had been doing relatively well but is having more of a dropped cuboid pain on both feet. Patient states that it seems to be just to the inside of the longitudinal arch. Patient states it is more painful when he does running. Patient continues to work out on a regular basis.    Past medical history, social, surgical and family history all reviewed in electronic medical record.   Review of Systems: No headache, visual changes, nausea, vomiting, diarrhea, constipation, dizziness, abdominal pain, skin rash, fevers, chills, night sweats, weight loss, swollen lymph nodes, body aches, joint swelling, muscle aches, chest pain, shortness of breath, mood changes.   Objective Blood pressure 128/86, pulse 68, height 6' (1.829 m), weight 289 lb (131.09 kg), SpO2 95 %.  General: No apparent distress alert and oriented x3 mood and affect normal, dressed appropriately. Mildly overweight.  HEENT: Pupils equal, extraocular movements intact  Respiratory: Patient's speak in full sentences and does not appear short of breath  Cardiovascular: No lower extremity edema, non tender, no erythema  Skin: Warm dry intact with no signs of infection or rash on extremities or on axial skeleton.  Abdomen: Soft nontender  Neuro: Cranial nerves II through XII are intact, neurovascularly intact in all extremities with 2+ DTRs and 2+ pulses.  Lymph: No lymphadenopathy of posterior or anterior cervical chain or axillae  bilaterally.  Gait normal with good balance and coordination.  MSK:  Non tender with full range of motion and good stability and symmetric strength and tone of shoulders, elbows, wrist, hip, knee and ankles bilaterally.  Normal inspection with no visable or palpable fat pad atrophy and no visible swelling/erythema. Bilateral foot exam shows the patient does have significant overpronation the hindfoot as well as splaying between the first and second toes showing some mild breakdown of the transverse arch patient also has significant rigid midfoot Patient though is tender to palpation over the inferior aspect of the cuboid bone on the the feet bilaterally laterally.  After verbal consent patient did have a plantar with done and patient did have an audible pop with relocation of the cuboid.     Impression and Recommendations:

## 2014-08-25 NOTE — Assessment & Plan Note (Signed)
Decision today to treat with OMT was based on Physical Exam  After verbal consent patient was treated with HVLA techniques in right foot areas  Patient tolerated the procedure well with improvement in symptoms  Patient given exercises, stretches and lifestyle modifications  See medications in patient instructions if given  Patient will follow up in 3 weeks if not perfect

## 2014-08-25 NOTE — Assessment & Plan Note (Signed)
Patient responded well to medication again today. We did make some changes to his custom orthotics. Patient will be needing new orthotics in the next 3-4 months. We discussed icing regimen and home exercises. Patient will continue to stay active. Patient come back and see me again in 3-4 weeks for further evaluation and treatment.

## 2014-08-25 NOTE — Patient Instructions (Addendum)
Good to see you Tyr changes in the orthotics We may need to make some more changes Take a towel and lay it out flat while watching tv and then bring it back with your toes and repeat as much as you can tolerate Ice is your friend Keep it up! Make an appointment in 3 weeksjust in case.

## 2014-09-15 ENCOUNTER — Ambulatory Visit (INDEPENDENT_AMBULATORY_CARE_PROVIDER_SITE_OTHER): Payer: 59 | Admitting: Family Medicine

## 2014-09-15 ENCOUNTER — Encounter: Payer: Self-pay | Admitting: Family Medicine

## 2014-09-15 VITALS — BP 116/80 | HR 70 | Ht 72.0 in | Wt 289.0 lb

## 2014-09-15 DIAGNOSIS — S93311A Subluxation of tarsal joint of right foot, initial encounter: Secondary | ICD-10-CM | POA: Diagnosis not present

## 2014-09-15 NOTE — Progress Notes (Signed)
  [facility] [address], [place] [address] Phone: [phone] Subjective:     CC: Heel pain follow up  [id number] Cody Cruz is a 39 y.o. male coming in with complaint of bilateral heel pain. Patient was found to have more of a plantar fasciitis. Patient does have severe pes planus bilaterally as well and has seen me previously for a dropped cuboid. Patient states overall he has been doing relatively well. An states that there is some mild increase in pain on the right foot. This is minimal though compared to his previous exam. Patient though states that his orthotics do not seem to be lasting as well as previously. Patient is working out 3 times a week and also doing a running regimen 3 times a week.    Past medical history, social, surgical and family history all reviewed in electronic medical record.   Review of Systems: No headache, visual changes, nausea, vomiting, diarrhea, constipation, dizziness, abdominal pain, skin rash, fevers, chills, night sweats, weight loss, swollen lymph nodes, body aches, joint swelling, muscle aches, chest pain, shortness of breath, mood changes.   Objective Blood pressure 116/80, pulse 70, height 6' (1.829 m), weight 289 lb (131.09 kg), SpO2 98 %.  General: No apparent distress alert and oriented x3 mood and affect normal, dressed appropriately. Mildly overweight.  HEENT: Pupils equal, extraocular movements intact  Respiratory: Patient's speak in full sentences and does not appear short of breath  Cardiovascular: No lower extremity edema, non tender, no erythema  Skin: Warm dry intact with no signs of infection or rash on extremities or on axial skeleton.  Abdomen: Soft nontender  Neuro: Cranial nerves II through XII are intact, neurovascularly intact in all extremities with 2+ DTRs and 2+ pulses.  Lymph: No lymphadenopathy of posterior or anterior cervical chain or axillae bilaterally.  Gait normal with  good balance and coordination.  MSK:  Non tender with full range of motion and good stability and symmetric strength and tone of shoulders, elbows, wrist, hip, knee and ankles bilaterally.  Normal inspection with no visable or palpable fat pad atrophy and no visible swelling/erythema. Bilateral foot exam shows the patient does have significant overpronation the hindfoot as well as splaying between the first and second toes showing some mild breakdown of the transverse arch patient also has significant rigid midfoot This is nontender on exam over the cuboid bone today. This is an improvement from previous exam.     Impression and Recommendations:

## 2014-09-15 NOTE — Assessment & Plan Note (Signed)
Asian overall is doing relatively well. I do think that patient having more of a rigid orthotic to be more beneficial. Patient will be put a new ones in the near future. We discussed continuing the exercises. Encourage weight loss. Patient follow-up with me 4 weeks after the orthotic.

## 2014-09-15 NOTE — Patient Instructions (Addendum)
Good to see you We will get you set up for new orthotics that should help, will take time to transition.  Conitnue to monitor Can consider bracing as well arch  The bone is in today So we will montior See me again 4 weeks after the orthotics.

## 2014-09-15 NOTE — Progress Notes (Signed)
Pre visit review using our clinic review tool, if applicable. No additional management support is needed unless otherwise documented below in the visit note. 

## 2014-09-27 ENCOUNTER — Ambulatory Visit (INDEPENDENT_AMBULATORY_CARE_PROVIDER_SITE_OTHER): Payer: 59 | Admitting: Family Medicine

## 2014-09-27 ENCOUNTER — Encounter: Payer: Self-pay | Admitting: Family Medicine

## 2014-09-27 DIAGNOSIS — M2142 Flat foot [pes planus] (acquired), left foot: Secondary | ICD-10-CM

## 2014-09-27 DIAGNOSIS — M2141 Flat foot [pes planus] (acquired), right foot: Secondary | ICD-10-CM | POA: Diagnosis not present

## 2014-09-27 NOTE — Assessment & Plan Note (Signed)
Patient was fitted today. Patient increase wear over the course the next 2-4 weeks. We discussed his changes may be necessary. Patient call if he has any difficulty. Patient will follow-up again in 4 weeks for further evaluation and treatment.

## 2014-09-27 NOTE — Progress Notes (Signed)
Patient was fitted for a : standard, cushioned, semi-rigid orthotic. The orthotic was heated and afterward the patient was in a seated position and the orthotic molded. The patient was positioned in subtalar neutral position and 10 degrees of ankle dorsiflexion in a non-weight bearing stance. After completion of molding, patient did have orthotic management which included instructions on acclimating to the orthotics, signs of ill fit as well as care for the orthotic.  I spent approximately 57minutes with the patient fitting the custom orthotics, making appropriate adjustments, and fitting to shoes for comfort.   The blank was ground to a stable position for weight bearing. Size: 11 (igli Active)  Base: Carbon fiber Additional Posting and Padding: The following postings were fitted onto the molded orthotics to help maintain a talar neutral position - Wedge posting for transverse arch:  None      Silicone posting for longitudinal arch: 250/80 (ground to this position)  The patient ambulated these, and they were very comfortable and supportive.

## 2014-09-27 NOTE — Patient Instructions (Signed)
Acclimating to your Igli orthotics -  ° °We recommend that you allow up to 2 weeks for you to fully acclimate to your new custom orthotics. Please use the following recommended plan to build into full day wear.  ° °Day 1 - 2hours/day °Every day afterwards add 1 hour of wear(3hrs/day, 4hrs/day, etc) until you are able to wear them for an entire day without issues.  ° °If you notice any irritation or increasing discomfort with your new orthotics, please do not hesitate in contacting the office(leave a message for Maliyah Willets or Lindsay) or sending Jaxtyn Linville a message through MyChart to arrange a time to review your fit.  ° °Enjoy your new orthotics!!  ° °Cody Cruz ° ° ° °

## 2015-05-12 ENCOUNTER — Other Ambulatory Visit (INDEPENDENT_AMBULATORY_CARE_PROVIDER_SITE_OTHER): Payer: 59

## 2015-05-12 DIAGNOSIS — Z0000 Encounter for general adult medical examination without abnormal findings: Secondary | ICD-10-CM | POA: Diagnosis not present

## 2015-05-12 LAB — COMPREHENSIVE METABOLIC PANEL
ALT: 32 U/L (ref 0–53)
AST: 20 U/L (ref 0–37)
Albumin: 4.7 g/dL (ref 3.5–5.2)
Alkaline Phosphatase: 81 U/L (ref 39–117)
BUN: 16 mg/dL (ref 6–23)
CO2: 29 meq/L (ref 19–32)
Calcium: 9.4 mg/dL (ref 8.4–10.5)
Chloride: 105 mEq/L (ref 96–112)
Creatinine, Ser: 1.01 mg/dL (ref 0.40–1.50)
GFR: 86.21 mL/min (ref >60.00)
GLUCOSE: 99 mg/dL (ref 70–99)
Potassium: 4.1 mEq/L (ref 3.5–5.1)
SODIUM: 140 meq/L (ref 135–145)
Total Bilirubin: 0.7 mg/dL (ref 0.2–1.2)
Total Protein: 7.2 g/dL (ref 6.0–8.3)

## 2015-05-12 LAB — CBC WITH DIFFERENTIAL/PLATELET
Basophils Absolute: 0 10*3/uL (ref 0.0–0.1)
Basophils Relative: 0.5 % (ref 0.0–3.0)
EOS ABS: 0.1 10*3/uL (ref 0.0–0.7)
Eosinophils Relative: 1.8 % (ref 0.0–5.0)
HCT: 45.7 % (ref 39.0–52.0)
Hemoglobin: 15.5 g/dL (ref 13.0–17.0)
LYMPHS ABS: 1.7 10*3/uL (ref 0.7–4.0)
Lymphocytes Relative: 32.9 % (ref 12.0–46.0)
MCHC: 33.8 g/dL (ref 30.0–36.0)
MCV: 88.4 fl (ref 78.0–100.0)
MONOS PCT: 7.6 % (ref 3.0–12.0)
Monocytes Absolute: 0.4 10*3/uL (ref 0.1–1.0)
NEUTROS PCT: 57.2 % (ref 43.0–77.0)
Neutro Abs: 2.9 10*3/uL (ref 1.4–7.7)
Platelets: 283 10*3/uL (ref 150.0–400.0)
RBC: 5.18 Mil/uL (ref 4.22–5.81)
RDW: 12.8 % (ref 11.5–15.5)
WBC: 5.1 10*3/uL (ref 4.0–10.5)

## 2015-05-12 LAB — LIPID PANEL
CHOL/HDL RATIO: 6
Cholesterol: 245 mg/dL — ABNORMAL HIGH (ref 0–200)
HDL: 40.5 mg/dL (ref >39.00)
LDL Cholesterol: 173 mg/dL — ABNORMAL HIGH (ref 0–99)
NONHDL: 204.87
Triglycerides: 158 mg/dL — ABNORMAL HIGH (ref 0.0–149.0)
VLDL: 31.6 mg/dL (ref 0.0–40.0)

## 2015-05-12 LAB — TSH: TSH: 1.46 u[IU]/mL (ref 0.35–4.50)

## 2015-05-16 ENCOUNTER — Encounter: Payer: Self-pay | Admitting: Family Medicine

## 2015-05-16 ENCOUNTER — Ambulatory Visit (INDEPENDENT_AMBULATORY_CARE_PROVIDER_SITE_OTHER): Payer: 59 | Admitting: Family Medicine

## 2015-05-16 VITALS — BP 151/82 | HR 55 | Temp 98.6°F | Resp 16 | Ht 72.0 in | Wt 288.8 lb

## 2015-05-16 DIAGNOSIS — G4733 Obstructive sleep apnea (adult) (pediatric): Secondary | ICD-10-CM | POA: Diagnosis not present

## 2015-05-16 DIAGNOSIS — Z0000 Encounter for general adult medical examination without abnormal findings: Secondary | ICD-10-CM

## 2015-05-16 DIAGNOSIS — R030 Elevated blood-pressure reading, without diagnosis of hypertension: Secondary | ICD-10-CM | POA: Diagnosis not present

## 2015-05-16 DIAGNOSIS — E785 Hyperlipidemia, unspecified: Secondary | ICD-10-CM

## 2015-05-16 NOTE — Progress Notes (Signed)
Pre visit review using our clinic review tool, if applicable. No additional management support is needed unless otherwise documented below in the visit note. 

## 2015-05-16 NOTE — Progress Notes (Signed)
Office Note 05/16/2015  CC:  Chief Complaint  Patient presents with  . Annual Exam    Pt is not fasting, labs done 05/12/15    HPI:  Cody Cruz is a 40 y.o. White male who is here for annual health maintenance exam. BP when gave blood 150s/90s.  BP today similar. No recent OTC decongestant.  No caffeine intake changes. No signif pain or stress that could be affecting bp. No HA/s, dizziness, or blurry vision.  No inhaler use recently. No signif wt gain. He snores loudly, wife does say he stops breathing sometimes during sleep.  He admits to waking up about 6 times a night during sleep, ? Related to his breathing?.  Reviewed fasting labs in detail today: all normal except elevated LDL, but his 10 yr CV risk comes out to 3.3%. Decided against statin at this time.   Past Medical History  Diagnosis Date  . Mild intermittent asthma     activity induced, typically.  Allergies trigger as a kid.  Marland Kitchen Hyperlipidemia     Mild, no meds required in past.  . Obesity, Class II, BMI 35-39.9     Past Surgical History  Procedure Laterality Date  . Appendectomy  1989  . Tonsillectomy and adenoidectomy  1979    Family History  Problem Relation Age of Onset  . Arthritis Mother   . Hyperlipidemia Mother   . Hypertension Mother   . Hypertension Father     Social History   Social History  . Marital Status: Married    Spouse Name: N/A  . Number of Children: N/A  . Years of Education: N/A   Occupational History  . Not on file.   Social History Main Topics  . Smoking status: Never Smoker   . Smokeless tobacco: Never Used  . Alcohol Use: Yes  . Drug Use: No  . Sexual Activity: Not on file   Other Topics Concern  . Not on file   Social History Narrative   Married, two children (one son and one daughter).   Education: [name].   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   No tobacco.  Alc 3-4 beers per week avg.  No alc/drugs.   Exercise: CV and  wt's at a GYM 3-5 days a week when feet are not hurting him.    No outpatient prescriptions prior to visit.   No facility-administered medications prior to visit.    No Known Allergies  ROS Review of Systems  Constitutional: Negative for fever, chills, appetite change and fatigue.  HENT: Negative for congestion, dental problem, ear pain and sore throat.   Eyes: Negative for discharge, redness and visual disturbance.  Respiratory: Negative for cough, chest tightness, shortness of breath and wheezing.   Cardiovascular: Negative for chest pain, palpitations and leg swelling.  Gastrointestinal: Negative for nausea, vomiting, abdominal pain, diarrhea and blood in stool.  Genitourinary: Negative for dysuria, urgency, frequency, hematuria, flank pain and difficulty urinating.  Musculoskeletal: Negative for myalgias, back pain, joint swelling, arthralgias and neck stiffness.  Skin: Negative for pallor and rash.  Neurological: Negative for dizziness, speech difficulty, weakness and headaches.  Hematological: Negative for adenopathy. Does not bruise/bleed easily.  Psychiatric/Behavioral: Positive for sleep disturbance. Negative for confusion. The patient is not nervous/anxious.     PE; Blood pressure 151/82, pulse 55, temperature 98.6 F (37 C), temperature source Other (Comment), resp. rate 16, height 6' (1.829 m), weight 288 lb 12 oz (130.976 kg), SpO2 99 %. Gen: Alert, well appearing.  Patient is oriented to person, place, time, and situation. AFFECT: pleasant, lucid thought and speech. ENT: Ears: EACs clear, normal epithelium.  TMs with good light reflex and landmarks bilaterally.  Eyes: no injection, icteris, swelling, or exudate.  EOMI, PERRLA. Nose: no drainage or turbinate edema/swelling.  No injection or focal lesion.  Mouth: lips without lesion/swelling.  Oral mucosa pink and moist.  Dentition intact and without obvious caries or gingival swelling.  Oropharynx without erythema,  exudate, or swelling.  Neck: supple/nontender.  No LAD, mass, or TM.  Carotid pulses 2+ bilaterally, without bruits. CV: RRR, no m/r/g.   LUNGS: CTA bilat, nonlabored resps, good aeration in all lung fields. ABD: soft, NT, ND, BS normal.  No hepatospenomegaly or mass.  No bruits. EXT: no clubbing, cyanosis, or edema.  Musculoskeletal: no joint swelling, erythema, warmth, or tenderness.  ROM of all joints intact. Skin - no sores or suspicious lesions or rashes or color changes  Pertinent labs:  Lab Results  Component Value Date   TSH 1.46 05/12/2015   Lab Results  Component Value Date   WBC 5.1 05/12/2015   HGB 15.5 05/12/2015   HCT 45.7 05/12/2015   MCV 88.4 05/12/2015   PLT 283.0 05/12/2015   Lab Results  Component Value Date   CREATININE 1.01 05/12/2015   BUN 16 05/12/2015   NA 140 05/12/2015   K 4.1 05/12/2015   CL 105 05/12/2015   CO2 29 05/12/2015   Lab Results  Component Value Date   ALT 32 05/12/2015   AST 20 05/12/2015   ALKPHOS 81 05/12/2015   BILITOT 0.7 05/12/2015   Lab Results  Component Value Date   CHOL 245* 05/12/2015   Lab Results  Component Value Date   HDL 40.50 05/12/2015   Lab Results  Component Value Date   LDLCALC 173* 05/12/2015   Lab Results  Component Value Date   TRIG 158.0* 05/12/2015   Lab Results  Component Value Date   CHOLHDL 6 05/12/2015    ASSESSMENT AND PLAN:   1) Elevated bp w/out dx of HTN:  Recommended pt get bp cuff and monitor bp and HR once daily and write them down for review with me at f/u in office in 2 wks.  2) OSA: he describes this.  I wonder if his recent elevated bp is a reflection of his OSA. Pulm referral ordered.  3) Health maintenance exam: Reviewed age and gender appropriate health maintenance issues (prudent diet, regular exercise, health risks of tobacco and excessive alcohol, use of seatbelts, fire alarms in home, use of sunscreen).  Also reviewed age and gender appropriate health screening as  well as vaccine recommendations. He declined flu vaccine. Decided to hold off on statin for his elevated LDL at this time.  An After Visit Summary was printed and given to the patient.  FOLLOW UP:  Return in about 2 weeks (around 05/30/2015) for f/u elevated blood pressures.

## 2015-06-02 ENCOUNTER — Ambulatory Visit: Payer: 59 | Admitting: Family Medicine

## 2015-06-03 ENCOUNTER — Encounter: Payer: Self-pay | Admitting: Family Medicine

## 2015-06-03 ENCOUNTER — Ambulatory Visit (INDEPENDENT_AMBULATORY_CARE_PROVIDER_SITE_OTHER): Payer: 59 | Admitting: Family Medicine

## 2015-06-03 VITALS — BP 118/86 | HR 100 | Temp 98.1°F | Resp 16 | Ht 72.0 in | Wt 292.5 lb

## 2015-06-03 DIAGNOSIS — R030 Elevated blood-pressure reading, without diagnosis of hypertension: Secondary | ICD-10-CM | POA: Diagnosis not present

## 2015-06-03 NOTE — Progress Notes (Signed)
Pre visit review using our clinic review tool, if applicable. No additional management support is needed unless otherwise documented below in the visit note. 

## 2015-06-03 NOTE — Progress Notes (Signed)
OFFICE VISIT  06/03/2015   CC:  Chief Complaint  Patient presents with  . Follow-up    elevated BP. Pt did bring in BP readings from home.     HPI:    Patient is a 40 y.o. Caucasian male who presents for for 2 wk f/u elev bp w/out dx of HTN. He has sleep breathing patterns suspicious for OSA and has been referred to pulm (07/05/15) for consideration of sleep study. His outside bp's that he brought in today show 10 readings; avg syst 145 or so, avg diast 88 or so, HR avg 65.  Highest reading 155 syst, 91 diast.  He denies HA, vision problems, dizziness, CP, SOB, or palpitations.   Past Medical History  Diagnosis Date  . Mild intermittent asthma     activity induced, typically.  Allergies trigger as a kid.  Marland Kitchen Hyperlipidemia     Mild, no meds required in past.  . Obesity, Class II, BMI 35-39.9     Past Surgical History  Procedure Laterality Date  . Appendectomy  1989  . Tonsillectomy and adenoidectomy  1979   MEDS: none  No Known Allergies  ROS As per HPI  PE: Blood pressure 118/86, pulse 100, temperature 98.1 F (36.7 C), temperature source Oral, resp. rate 16, height 6' (1.829 m), weight 292 lb 8 oz (132.677 kg), SpO2 94 %. Gen: Alert, well appearing.  Patient is oriented to person, place, time, and situation. AFFECT: pleasant, lucid thought and speech. No further exam today.  LABS:  None today  IMPRESSION AND PLAN:  Elevated blood pressure without diagnosis of HTN: he likely has very mild HTN, but I want to wait and see how his evaluation for OSA goes.  If he does not have OSA, or if he has OSA and gets on CPAP and bp's don't improve, then we may have to start a low dose of antihypertensive.  At this time, no meds. Pt agrees with this approach. He'll continue to monitor bp periodically and call if they are worsening. An After Visit Summary was printed and given to the patient.  FOLLOW UP: Return in about 3 months (around 09/03/2015) for f/u elev bp and  ?OSA.  Signed:  Crissie Sickles, MD           06/03/2015

## 2015-07-11 ENCOUNTER — Ambulatory Visit (INDEPENDENT_AMBULATORY_CARE_PROVIDER_SITE_OTHER): Payer: 59 | Admitting: Pulmonary Disease

## 2015-07-11 ENCOUNTER — Encounter: Payer: Self-pay | Admitting: Pulmonary Disease

## 2015-07-11 VITALS — BP 138/82 | HR 81 | Ht 72.0 in | Wt 288.0 lb

## 2015-07-11 DIAGNOSIS — J4520 Mild intermittent asthma, uncomplicated: Secondary | ICD-10-CM

## 2015-07-11 DIAGNOSIS — E669 Obesity, unspecified: Secondary | ICD-10-CM | POA: Diagnosis not present

## 2015-07-11 DIAGNOSIS — J45909 Unspecified asthma, uncomplicated: Secondary | ICD-10-CM | POA: Insufficient documentation

## 2015-07-11 DIAGNOSIS — G4710 Hypersomnia, unspecified: Secondary | ICD-10-CM

## 2015-07-11 NOTE — Assessment & Plan Note (Signed)
Stable. Prn albuterol.

## 2015-07-11 NOTE — Progress Notes (Signed)
Subjective:    Patient ID: Cody Cruz, male    DOB: 1975-09-08, 40 y.o.   MRN: [id number]  HPI   This is the case of Cody Cruz, 40 y.o. Male, who was referred by Dr. Ernestine Conrad  in consultation regarding possible OSA.   As you very well know, patient is a non smoker. He had asthma as a child -- on prn albuterol.  Pt has snoring, wakes up several times/night.  Has gasping or witnessed apneas.  Sleeps 5-7 hrs/night. Wakes up sleepy and unrefreshed in am.  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 Can get sleepy driving.  Did sleep talking as a child. (-) abnormal behavior now.  Hypersomnia affects fxnality.    Asthma is generally stable. Prn alb.   No recent hospitalization.      Review of Systems  Constitutional: Negative.  Negative for fever and unexpected weight change.  HENT: Negative.  Negative for congestion, dental problem, ear pain, nosebleeds, postnasal drip, rhinorrhea, sinus pressure, sneezing, sore throat and trouble swallowing.   Eyes: Negative.  Negative for redness and itching.  Respiratory: Negative.  Negative for cough, chest tightness, shortness of breath and wheezing.   Cardiovascular: Negative.  Negative for palpitations and leg swelling.  Gastrointestinal: Negative.  Negative for nausea and vomiting.  Endocrine: Negative.   Genitourinary: Negative.  Negative for dysuria.  Musculoskeletal: Negative.  Negative for joint swelling.  Skin: Negative.  Negative for rash.  Allergic/Immunologic: Positive for environmental allergies.  Neurological: Negative.  Negative for headaches.  Hematological: Negative.  Does not bruise/bleed easily.  Psychiatric/Behavioral: Negative.  Negative for dysphoric mood. The patient is not nervous/anxious.   All other systems reviewed and are negative.   Past Medical History  Diagnosis Date  . Mild intermittent asthma     activity induced, typically.  Allergies trigger as a kid.  Marland Kitchen  Hyperlipidemia     Mild, no meds required in past.  . Obesity, Class II, BMI 35-39.9    (-) CA, DVT  Family History  Problem Relation Age of Onset  . Arthritis Mother   . Hyperlipidemia Mother   . Hypertension Mother   . Hypertension Father      Past Surgical History  Procedure Laterality Date  . Appendectomy  1989  . Tonsillectomy and adenoidectomy  1979    Social History   Social History  . Marital Status: Married    Spouse Name: N/A  . Number of Children: N/A  . Years of Education: N/A   Occupational History  . Not on file.   Social History Main Topics  . Smoking status: Never Smoker   . Smokeless tobacco: Never Used  . Alcohol Use: Yes  . Drug Use: No  . Sexual Activity: Not on file   Other Topics Concern  . Not on file   Social History Narrative   Married, two children (one son and one daughter).   Education: [name].   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   No tobacco.  Alc 3-4 beers per week avg.  No alc/drugs.   Exercise: CV and wt's at a GYM 3-5 days a week when feet are not hurting him.     No Known Allergies   No outpatient prescriptions prior to visit.   No facility-administered medications prior to visit.   No orders of the defined types were placed in this encounter.          Objective:   Physical Exam  Vitals:  Filed Vitals:   07/11/15 1001  BP: 138/82  Pulse: 81  Height: 6' (1.829 m)  Weight: 288 lb (130.636 kg)  SpO2: 96%    Constitutional/General:  Pleasant, well-nourished, well-developed, not in any distress,  Comfortably seating.  Well kempt  Body mass index is 39.05 kg/(m^2). Wt Readings from Last 3 Encounters:  07/11/15 288 lb (130.636 kg)  06/03/15 292 lb 8 oz (132.677 kg)  05/16/15 288 lb 12 oz (130.976 kg)    Neck circumference: 20 inches  HEENT: Pupils equal and reactive to light and accommodation. Anicteric sclerae. Normal nasal mucosa.   No oral  lesions,  mouth clear,   oropharynx clear, no postnasal drip. (-) Oral thrush. No dental caries.  Airway - Mallampati class III.  Retrognathia.   Neck: No masses. Midline trachea. No JVD, (-) LAD. (-) bruits appreciated.  Respiratory/Chest: Grossly normal chest. (-) deformity. (-) Accessory muscle use.  Symmetric expansion. (-) Tenderness on palpation.  Resonant on percussion.  Diminished BS on both lower lung zones. (-) wheezing, crackles, rhonchi (-) egophony  Cardiovascular: Regular rate and  rhythm, heart sounds normal, no murmur or gallops, no peripheral edema  Gastrointestinal:  Normal bowel sounds. Soft, non-tender. No hepatosplenomegaly.  (-) masses.   Musculoskeletal:  Normal muscle tone. Normal gait.   Extremities: Grossly normal. (-) clubbing, cyanosis.  (-) edema  Skin: (-) rash,lesions seen.   Neurological/Psychiatric : alert, oriented to time, place, person. Normal mood and affect           Assessment & Plan:  Hypersomnia Pt has snoring, wakes up several times/night.  Has gasping or witnessed apneas.  Sleeps 5-7 hrs/night. Wakes up sleepy and unrefreshed in am.  He drives daily as part of work, anywhere from [place] to [place]. Can get sleepy driving.  Did sleep talking as a child. (-) abnormal behavior now.  Hypersomnia affects fxnality.   ESS 10. Neck circ 20 inches. Hypersomnia affects fxnality.  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  Plan : 1. HST. 2. Anticipate no issues with cpap. Father has osa and cpap.  3. Will try autocpap. 4. Needs good compliance 2/2 work. 5. Sleep hygiene.   Obesity Weight reduction.   Asthma Stable. Prn albuterol.      Thank you very much for letting me participate in this patient's care. Please do not hesitate to give me a call if you have any questions or concerns regarding the treatment plan.   Patient will follow up with me in 3-4 mos.     Monica Becton, MD 07/11/2015   10:27 AM Pulmonary and  [place] Pager: [phone] Office: [phone], Fax: [phone]

## 2015-07-11 NOTE — Patient Instructions (Signed)
1. We will order you a home sleep study and will call you with results. 2. We will order you a cpap if sleep test is positive. 3. Call us if with issues with cpap.  Return to clinic in 3-4 mos.

## 2015-07-11 NOTE — Assessment & Plan Note (Signed)
Pt has snoring, wakes up several times/night.  Has gasping or witnessed apneas.  Sleeps 5-7 hrs/night. Wakes up sleepy and unrefreshed in am.  He drives daily as part of work, anywhere from [place] to [place]. Can get sleepy driving.  Did sleep talking as a child. (-) abnormal behavior now.  Hypersomnia affects fxnality.   ESS 10. Neck circ 20 inches. Hypersomnia affects fxnality.  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  Plan : 1. HST. 2. Anticipate no issues with cpap. Father has osa and cpap.  3. Will try autocpap. 4. Needs good compliance 2/2 work. 5. Sleep hygiene.

## 2015-07-11 NOTE — Assessment & Plan Note (Signed)
Weight reduction 

## 2015-07-25 DIAGNOSIS — G4733 Obstructive sleep apnea (adult) (pediatric): Secondary | ICD-10-CM | POA: Diagnosis not present

## 2015-07-28 ENCOUNTER — Telehealth: Payer: Self-pay | Admitting: Pulmonary Disease

## 2015-07-28 DIAGNOSIS — G4733 Obstructive sleep apnea (adult) (pediatric): Secondary | ICD-10-CM

## 2015-07-28 NOTE — Telephone Encounter (Signed)
  Please call the pt and tell the pt the [place]  showed OSA    Pt stops breathing 18   times an hour.   Please order autoCPAP 5-15 cm H2O. Patient will need a mask fitting session. Patient will need a 1 month download.   Patient needs to be seen 4-6 weeks after obtaining the cpap machine. Let me know if you receive this.   Thanks!   J. Shirl Harris, MD 07/28/2015, 10:25 PM

## 2015-07-29 ENCOUNTER — Other Ambulatory Visit: Payer: Self-pay | Admitting: *Deleted

## 2015-07-29 DIAGNOSIS — G4710 Hypersomnia, unspecified: Secondary | ICD-10-CM

## 2015-07-29 NOTE — Telephone Encounter (Signed)
Spoke with pt and gave results and recommendations. Pt agrees to start CPAP therapy. Order placed. Pt aware to call office and schedule f/u appt once starts CPAP. Nothing further needed.  

## 2015-08-05 ENCOUNTER — Telehealth: Payer: Self-pay | Admitting: Pulmonary Disease

## 2015-08-05 NOTE — Telephone Encounter (Signed)
Spoke to pt he has spoken to [email or web address] and is set up to get his cpap Joellen [place]

## 2015-08-05 NOTE — Telephone Encounter (Signed)
lmtcb Cody Cruz ° °

## 2015-08-23 ENCOUNTER — Telehealth: Payer: Self-pay | Admitting: Pulmonary Disease

## 2015-08-23 NOTE — Telephone Encounter (Signed)
Rodena Piety, do you have the CMN for this pt?  Please advise thanks!

## 2015-08-23 NOTE — Telephone Encounter (Signed)
I just spoke with Ivin Booty with [facility] and she stated she has this CMN about 2 minutes after she called about it.

## 2015-10-20 ENCOUNTER — Encounter: Payer: Self-pay | Admitting: Pulmonary Disease

## 2015-10-20 ENCOUNTER — Ambulatory Visit (INDEPENDENT_AMBULATORY_CARE_PROVIDER_SITE_OTHER): Payer: 59 | Admitting: Pulmonary Disease

## 2015-10-20 VITALS — BP 128/82 | HR 69 | Ht 72.0 in | Wt 273.0 lb

## 2015-10-20 DIAGNOSIS — E669 Obesity, unspecified: Secondary | ICD-10-CM

## 2015-10-20 DIAGNOSIS — J4520 Mild intermittent asthma, uncomplicated: Secondary | ICD-10-CM | POA: Diagnosis not present

## 2015-10-20 DIAGNOSIS — G4733 Obstructive sleep apnea (adult) (pediatric): Secondary | ICD-10-CM

## 2015-10-20 NOTE — Assessment & Plan Note (Signed)
Pt has snoring, wakes up several times/night.  Has gasping or witnessed apneas.  Sleeps 5-7 hrs/night. Wakes up sleepy and unrefreshed in am.  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 Can get sleepy driving.  Did sleep talking as a child. (-) abnormal behavior now.  Hypersomnia affects fxnality.   HST 07/2015 : AHI 18. autocpap 5-15. DL in July > AHI 1, 80%. Patient feels better using CPAP but still gets sleepy in the afternoon. He has frequent awakenings at night still and not sure if it's related to mask leak. He still does a lot of driving during  the daytime and can get sleepy in the afternoon but not as bad.   Plan :  We extensively discussed the importance of treating OSA and the need to use PAP therapy.   Continue with autocpap 5-15 cm water. Good compliance and feels benefit.  Still with frequent awakenings at night likely from mask issues and leak. Plan to try nasal mask or pillows or Amara mask. If the awakenings persist, told pt to call back.   Also, still with sleepiness in pm. Not as bad.  Pt will call if this will be persistent. Will try Nuvigil or Provigil. 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 Not on any sedating meds.    Patient was instructed to have mask, tubings, filter, reservoir cleaned at least once a week with soapy water.  Patient was instructed to call the office if he/she is having issues with the PAP device.    I advised patient to obtain sufficient amount of sleep --  7 to 8 hours at least in a 24 hr period.  Patient was advised to follow good sleep hygiene.  Patient was advised NOT to engage in activities requiring concentration and/or vigilance if he/she is and  sleepy.  Patient is NOT to drive if he/she is sleepy.

## 2015-10-20 NOTE — Assessment & Plan Note (Signed)
Lost 20 lbs! Weight reduction.

## 2015-10-20 NOTE — Assessment & Plan Note (Signed)
Stable. Prn albuterol.

## 2015-10-20 NOTE — Progress Notes (Signed)
Subjective:    Patient ID: Cody Cruz, male    DOB: Oct 04, 1975, 40 y.o.   MRN: [id number]  HPI   This is the case of Cody Cruz, 40 y.o. Male, who was referred by Dr. Ernestine Conrad  in consultation regarding possible OSA.   As you very well know, patient is a non smoker. He had asthma as a child -- on prn albuterol.  Pt has snoring, wakes up several times/night.  Has gasping or witnessed apneas.  Sleeps 5-7 hrs/night. Wakes up sleepy and unrefreshed in am.  He drives daily as part of work, anywhere from [place] to [employer]. Can get sleepy driving.  Did sleep talking as a child. (-) abnormal behavior now.  Hypersomnia affects fxnality.    Asthma is generally stable. Prn alb.   No recent hospitalization.   ROV  10/20/15 Pt Returns to office as follow-up on his sleep apnea. Since last seen, he had a home sleep study which showed AHI of 18. He was started on auto CPAP 5-15 cm water. He feels better using the machine. Issues with the mask. Has leak which wakes him up several times during night. Download for one month in July showed AHI of 0.7, 80% compliance.   Review of Systems  Constitutional: Negative.  Negative for fever and unexpected weight change.  HENT: Negative.  Negative for congestion, dental problem, ear pain, nosebleeds, postnasal drip, rhinorrhea, sinus pressure, sneezing, sore throat and trouble swallowing.   Eyes: Negative.  Negative for redness and itching.  Respiratory: Negative.  Negative for cough, chest tightness, shortness of breath and wheezing.   Cardiovascular: Negative.  Negative for palpitations and leg swelling.  Gastrointestinal: Negative.  Negative for nausea and vomiting.  Endocrine: Negative.   Genitourinary: Negative.  Negative for dysuria.  Musculoskeletal: Negative.  Negative for joint swelling.  Skin: Negative.  Negative for rash.  Allergic/Immunologic: Positive for environmental allergies.  Neurological: Negative.  Negative for headaches.    Hematological: Negative.  Does not bruise/bleed easily.  Psychiatric/Behavioral: Negative.  Negative for dysphoric mood. The patient is not nervous/anxious.   All other systems reviewed and are negative.         Objective:   Physical Exam  Vitals:  Vitals:   10/20/15 0923  BP: 128/82  Pulse: 69  SpO2: 98%  Weight: 273 lb (123.8 kg)  Height: 6' (1.829 m)    Constitutional/General:  Pleasant, well-nourished, well-developed, not in any distress,  Comfortably seating.  Well kempt  Body mass index is 37.03 kg/m. Wt Readings from Last 3 Encounters:  10/20/15 273 lb (123.8 kg)  07/11/15 288 lb (130.6 kg)  06/03/15 292 lb 8 oz (132.7 kg)    Neck circumference: 20 inches  HEENT: Pupils equal and reactive to light and accommodation. Anicteric sclerae. Normal nasal mucosa.   No oral  lesions,  mouth clear,  oropharynx clear, no postnasal drip. (-) Oral thrush. No dental caries.  Airway - Mallampati class III.  Retrognathia.   Neck: No masses. Midline trachea. No JVD, (-) LAD. (-) bruits appreciated.  Respiratory/Chest: Grossly normal chest. (-) deformity. (-) Accessory muscle use.  Symmetric expansion. (-) Tenderness on palpation.  Resonant on percussion.  Diminished BS on both lower lung zones. (-) wheezing, crackles, rhonchi (-) egophony  Cardiovascular: Regular rate and  rhythm, heart sounds normal, no murmur or gallops, no peripheral edema  Gastrointestinal:  Normal bowel sounds. Soft, non-tender. No hepatosplenomegaly.  (-) masses.   Musculoskeletal:  Normal muscle tone. Normal gait.  Extremities: Grossly normal. (-) clubbing, cyanosis.  (-) edema  Skin: (-) rash,lesions seen.   Neurological/Psychiatric : alert, oriented to time, place, person. Normal mood and affect           Assessment & Plan:  OSA (obstructive sleep apnea) Pt has snoring, wakes up several times/night.  Has gasping or witnessed apneas.  Sleeps 5-7 hrs/night. Wakes up sleepy  and unrefreshed in am.  He drives daily as part of work, anywhere from [place] to [place]. Can get sleepy driving.  Did sleep talking as a child. (-) abnormal behavior now.  Hypersomnia affects fxnality.   HST 07/2015 : AHI 18. autocpap 5-15. DL in July > AHI 1, 80%. Patient feels better using CPAP but still gets sleepy in the afternoon. He has frequent awakenings at night still and not sure if it's related to mask leak. He still does a lot of driving during  the daytime and can get sleepy in the afternoon but not as bad.   Plan :  We extensively discussed the importance of treating OSA and the need to use PAP therapy.   Continue with autocpap 5-15 cm water. Good compliance and feels benefit.  Still with frequent awakenings at night likely from mask issues and leak. Plan to try nasal mask or pillows or Amara mask. If the awakenings persist, told pt to call back.   Also, still with sleepiness in pm. Not as bad.  Pt will call if this will be persistent. Will try Nuvigil or Provigil. 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 Not on any sedating meds.    Patient was instructed to have mask, tubings, filter, reservoir cleaned at least once a week with soapy water.  Patient was instructed to call the office if he/she is having issues with the PAP device.    I advised patient to obtain sufficient amount of sleep --  7 to 8 hours at least in a 24 hr period.  Patient was advised to follow good sleep hygiene.  Patient was advised NOT to engage in activities requiring concentration and/or vigilance if he/she is and  sleepy.  Patient is NOT to drive if he/she is sleepy.   Asthma Stable. Prn albuterol.   Obesity Lost 20 lbs! Weight reduction.    Return to clinic in 1 year.      Monica Becton, MD 10/20/2015   9:53 AM [facility] Pager: [phone] Office: [phone], Fax: [phone]

## 2015-10-20 NOTE — Patient Instructions (Signed)
  It was a pleasure taking care of you today!  Continue using your CPAP machine.   Please make sure you use your CPAP device everytime you sleep.  We will monitor the usage of your machine per your insurance requirement.  Your insurance company may take the machine from you if you are not using it regularly.   Please clean the mask, tubings, filter, water reservoir with soapy water every week.  Please use distilled water for the water reservoir.   Please call the office or your machine provider (DME company) if you are having issues with the device.   Return to clinic in 1 year    

## 2015-10-31 ENCOUNTER — Encounter: Payer: Self-pay | Admitting: Family Medicine

## 2015-11-04 ENCOUNTER — Encounter: Payer: Self-pay | Admitting: Pulmonary Disease

## 2016-04-04 NOTE — Progress Notes (Deleted)
  [name] [address] [place], [place] [address] Phone: [phone] Subjective:    I'm seeing this patient by the request  of:  MCGOWEN,PHILIP H, MD   CC: Hamstring and shoulder pain  [id number]  Cody Cruz is a 41 y.o. male coming in with complaint of  Hamstring pain.  Shoulder pain.    Past Medical History:  Diagnosis Date  . Hyperlipidemia    Mild, no meds required in past.  . Mild intermittent asthma    activity induced, typically.  Allergies trigger as a kid.  . Obesity, Class II, BMI 35-39.9   . OSA on CPAP    Past Surgical History:  Procedure Laterality Date  . APPENDECTOMY  1989  . TONSILLECTOMY AND ADENOIDECTOMY  1979   Social History   Social History  . Marital status: Married    Spouse name: N/A  . Number of children: N/A  . Years of education: N/A   Social History Main Topics  . Smoking status: Never Smoker  . Smokeless tobacco: Never Used  . Alcohol use Yes  . Drug use: No  . Sexual activity: Not on file   Other Topics Concern  . Not on file   Social History Narrative   Married, two children (one son and one daughter).   Education: [name].   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   No tobacco.  Alc 3-4 beers per week avg.  No alc/drugs.   Exercise: CV and wt's at a GYM 3-5 days a week when feet are not hurting him.   No Known Allergies Family History  Problem Relation Age of Onset  . Arthritis Mother   . Hyperlipidemia Mother   . Hypertension Mother   . Hypertension Father     Past medical history, social, surgical and family history all reviewed in electronic medical record.  No pertanent information unless stated regarding to the chief complaint.   Review of Systems:Review of systems updated and as accurate as of 04/04/16  No headache, visual changes, nausea, vomiting, diarrhea, constipation, dizziness, abdominal pain, skin rash, fevers, chills, night sweats,  weight loss, swollen lymph nodes, body aches, joint swelling, muscle aches, chest pain, shortness of breath, mood changes.   Objective  There were no vitals taken for this visit. Systems examined below as of 04/04/16   General: No apparent distress alert and oriented x3 mood and affect normal, dressed appropriately.  HEENT: Pupils equal, extraocular movements intact  Respiratory: Patient's speak in full sentences and does not appear short of breath  Cardiovascular: No lower extremity edema, non tender, no erythema  Skin: Warm dry intact with no signs of infection or rash on extremities or on axial skeleton.  Abdomen: Soft nontender  Neuro: Cranial nerves II through XII are intact, neurovascularly intact in all extremities with 2+ DTRs and 2+ pulses.  Lymph: No lymphadenopathy of posterior or anterior cervical chain or axillae bilaterally.  Gait normal with good balance and coordination.  MSK:  Non tender with full range of motion and good stability and symmetric strength and tone of  elbows, wrist, hip, knee and ankles bilaterally.      Impression and Recommendations:     This case required medical decision making of moderate complexity.      Note: This dictation was prepared with Dragon dictation along with smaller phrase technology. Any transcriptional errors that result from this process are unintentional.

## 2016-04-05 ENCOUNTER — Ambulatory Visit: Payer: 59 | Admitting: Family Medicine

## 2016-04-06 DIAGNOSIS — M79605 Pain in left leg: Secondary | ICD-10-CM | POA: Diagnosis not present

## 2016-04-06 DIAGNOSIS — S76312A Strain of muscle, fascia and tendon of the posterior muscle group at thigh level, left thigh, initial encounter: Secondary | ICD-10-CM | POA: Diagnosis not present

## 2016-04-12 DIAGNOSIS — G4733 Obstructive sleep apnea (adult) (pediatric): Secondary | ICD-10-CM | POA: Diagnosis not present

## 2016-04-17 DIAGNOSIS — G4733 Obstructive sleep apnea (adult) (pediatric): Secondary | ICD-10-CM | POA: Diagnosis not present

## 2016-04-20 DIAGNOSIS — M79605 Pain in left leg: Secondary | ICD-10-CM | POA: Diagnosis not present

## 2016-04-20 DIAGNOSIS — S76312A Strain of muscle, fascia and tendon of the posterior muscle group at thigh level, left thigh, initial encounter: Secondary | ICD-10-CM | POA: Diagnosis not present

## 2016-04-23 DIAGNOSIS — M79605 Pain in left leg: Secondary | ICD-10-CM | POA: Diagnosis not present

## 2016-04-23 DIAGNOSIS — S76312A Strain of muscle, fascia and tendon of the posterior muscle group at thigh level, left thigh, initial encounter: Secondary | ICD-10-CM | POA: Diagnosis not present

## 2016-04-25 DIAGNOSIS — S76312A Strain of muscle, fascia and tendon of the posterior muscle group at thigh level, left thigh, initial encounter: Secondary | ICD-10-CM | POA: Diagnosis not present

## 2016-04-25 DIAGNOSIS — M79605 Pain in left leg: Secondary | ICD-10-CM | POA: Diagnosis not present

## 2016-04-30 DIAGNOSIS — S76312A Strain of muscle, fascia and tendon of the posterior muscle group at thigh level, left thigh, initial encounter: Secondary | ICD-10-CM | POA: Diagnosis not present

## 2016-04-30 DIAGNOSIS — M79605 Pain in left leg: Secondary | ICD-10-CM | POA: Diagnosis not present

## 2016-05-04 DIAGNOSIS — M79605 Pain in left leg: Secondary | ICD-10-CM | POA: Diagnosis not present

## 2016-05-04 DIAGNOSIS — S76312A Strain of muscle, fascia and tendon of the posterior muscle group at thigh level, left thigh, initial encounter: Secondary | ICD-10-CM | POA: Diagnosis not present

## 2016-05-07 DIAGNOSIS — M79605 Pain in left leg: Secondary | ICD-10-CM | POA: Diagnosis not present

## 2016-05-07 DIAGNOSIS — S76312A Strain of muscle, fascia and tendon of the posterior muscle group at thigh level, left thigh, initial encounter: Secondary | ICD-10-CM | POA: Diagnosis not present

## 2016-05-13 DIAGNOSIS — G4733 Obstructive sleep apnea (adult) (pediatric): Secondary | ICD-10-CM | POA: Diagnosis not present

## 2016-05-14 DIAGNOSIS — S76312A Strain of muscle, fascia and tendon of the posterior muscle group at thigh level, left thigh, initial encounter: Secondary | ICD-10-CM | POA: Diagnosis not present

## 2016-05-14 DIAGNOSIS — M79605 Pain in left leg: Secondary | ICD-10-CM | POA: Diagnosis not present

## 2016-05-18 DIAGNOSIS — M79605 Pain in left leg: Secondary | ICD-10-CM | POA: Diagnosis not present

## 2016-05-18 DIAGNOSIS — S76312D Strain of muscle, fascia and tendon of the posterior muscle group at thigh level, left thigh, subsequent encounter: Secondary | ICD-10-CM | POA: Diagnosis not present

## 2016-05-21 DIAGNOSIS — M79605 Pain in left leg: Secondary | ICD-10-CM | POA: Diagnosis not present

## 2016-05-21 DIAGNOSIS — S76312A Strain of muscle, fascia and tendon of the posterior muscle group at thigh level, left thigh, initial encounter: Secondary | ICD-10-CM | POA: Diagnosis not present

## 2016-05-25 DIAGNOSIS — M79605 Pain in left leg: Secondary | ICD-10-CM | POA: Diagnosis not present

## 2016-05-25 DIAGNOSIS — S76312A Strain of muscle, fascia and tendon of the posterior muscle group at thigh level, left thigh, initial encounter: Secondary | ICD-10-CM | POA: Diagnosis not present

## 2016-06-10 DIAGNOSIS — G4733 Obstructive sleep apnea (adult) (pediatric): Secondary | ICD-10-CM | POA: Diagnosis not present

## 2016-06-11 DIAGNOSIS — M79605 Pain in left leg: Secondary | ICD-10-CM | POA: Diagnosis not present

## 2016-06-11 DIAGNOSIS — S76312D Strain of muscle, fascia and tendon of the posterior muscle group at thigh level, left thigh, subsequent encounter: Secondary | ICD-10-CM | POA: Diagnosis not present

## 2016-07-11 DIAGNOSIS — G4733 Obstructive sleep apnea (adult) (pediatric): Secondary | ICD-10-CM | POA: Diagnosis not present

## 2016-09-14 DIAGNOSIS — H40013 Open angle with borderline findings, low risk, bilateral: Secondary | ICD-10-CM | POA: Diagnosis not present

## 2016-11-26 ENCOUNTER — Encounter: Payer: Self-pay | Admitting: Family Medicine

## 2016-11-26 ENCOUNTER — Ambulatory Visit (INDEPENDENT_AMBULATORY_CARE_PROVIDER_SITE_OTHER): Payer: 59 | Admitting: Family Medicine

## 2016-11-26 ENCOUNTER — Encounter: Payer: Self-pay | Admitting: *Deleted

## 2016-11-26 VITALS — BP 138/82 | HR 52 | Temp 98.2°F | Resp 16 | Ht 72.0 in | Wt 294.2 lb

## 2016-11-26 DIAGNOSIS — Z23 Encounter for immunization: Secondary | ICD-10-CM

## 2016-11-26 DIAGNOSIS — Z0000 Encounter for general adult medical examination without abnormal findings: Secondary | ICD-10-CM

## 2016-11-26 LAB — COMPREHENSIVE METABOLIC PANEL
ALBUMIN: 4.6 g/dL (ref 3.5–5.2)
ALK PHOS: 70 U/L (ref 39–117)
ALT: 31 U/L (ref 0–53)
AST: 25 U/L (ref 0–37)
BUN: 19 mg/dL (ref 6–23)
CALCIUM: 9.5 mg/dL (ref 8.4–10.5)
CHLORIDE: 102 meq/L (ref 96–112)
CO2: 28 mEq/L (ref 19–32)
CREATININE: 1.14 mg/dL (ref 0.40–1.50)
GFR: 74.42 mL/min (ref >60.00)
Glucose, Bld: 103 mg/dL — ABNORMAL HIGH (ref 70–99)
POTASSIUM: 4.3 meq/L (ref 3.5–5.1)
Sodium: 139 mEq/L (ref 135–145)
TOTAL PROTEIN: 6.8 g/dL (ref 6.0–8.3)
Total Bilirubin: 0.7 mg/dL (ref 0.2–1.2)

## 2016-11-26 LAB — CBC WITH DIFFERENTIAL/PLATELET
BASOS ABS: 0 10*3/uL (ref 0.0–0.1)
Basophils Relative: 0.9 % (ref 0.0–3.0)
EOS ABS: 0.1 10*3/uL (ref 0.0–0.7)
Eosinophils Relative: 1.8 % (ref 0.0–5.0)
HEMATOCRIT: 46.6 % (ref 39.0–52.0)
HEMOGLOBIN: 15.4 g/dL (ref 13.0–17.0)
LYMPHS PCT: 38.6 % (ref 12.0–46.0)
Lymphs Abs: 1.8 10*3/uL (ref 0.7–4.0)
MCHC: 33 g/dL (ref 30.0–36.0)
MCV: 90.3 fl (ref 78.0–100.0)
Monocytes Absolute: 0.4 10*3/uL (ref 0.1–1.0)
Monocytes Relative: 8.5 % (ref 3.0–12.0)
Neutro Abs: 2.3 10*3/uL (ref 1.4–7.7)
Neutrophils Relative %: 50.2 % (ref 43.0–77.0)
Platelets: 225 10*3/uL (ref 150.0–400.0)
RBC: 5.16 Mil/uL (ref 4.22–5.81)
RDW: 12.1 % (ref 11.5–15.5)
WBC: 4.5 10*3/uL (ref 4.0–10.5)

## 2016-11-26 LAB — LIPID PANEL
CHOLESTEROL: 245 mg/dL — AB (ref 0–200)
HDL: 42.3 mg/dL (ref >39.00)
LDL CALC: 174 mg/dL — AB (ref 0–99)
NonHDL: 202.34
TRIGLYCERIDES: 140 mg/dL (ref 0.0–149.0)
Total CHOL/HDL Ratio: 6
VLDL: 28 mg/dL (ref 0.0–40.0)

## 2016-11-26 LAB — TSH: TSH: 1.55 u[IU]/mL (ref 0.35–4.50)

## 2016-11-26 NOTE — Patient Instructions (Signed)

## 2016-11-26 NOTE — Progress Notes (Signed)
Office Note 11/26/2016  CC:  Chief Complaint  Patient presents with  . Annual Exam    Pt is fasting.    HPI:  Cody Cruz is a 41 y.o. male who is here for annual health maintenance exam. No acute complaints.  Exercise: 3-4 days week. Eyes: UTD. Dental: preventatives UTD. Diet: trying to stay balanced.  Past Medical History:  Diagnosis Date  . Hyperlipidemia    Mild, no meds required in past.  . Mild intermittent asthma    activity induced, typically.  Allergies trigger as a kid.  . Obesity, Class II, BMI 35-39.9   . OSA on CPAP     Past Surgical History:  Procedure Laterality Date  . APPENDECTOMY  1989  . TONSILLECTOMY AND ADENOIDECTOMY  1979    Family History  Problem Relation Age of Onset  . Arthritis Mother   . Hyperlipidemia Mother   . Hypertension Mother   . Hypertension Father     Social History   Social History  . Marital status: Married    Spouse name: N/A  . Number of children: N/A  . Years of education: N/A   Occupational History  . Not on file.   Social History Main Topics  . Smoking status: Never Smoker  . Smokeless tobacco: Never Used  . Alcohol use Yes  . Drug use: No  . Sexual activity: Not on file   Other Topics Concern  . Not on file   Social History Narrative   Married, two children (one son and one daughter).   Education: [name].   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   No tobacco.  Alc 3-4 beers per week avg.  No alc/drugs.   Exercise: CV and wt's at a GYM 3-5 days a week when feet are not hurting him.    No outpatient prescriptions prior to visit.   No facility-administered medications prior to visit.     No Known Allergies  ROS Review of Systems  Constitutional: Negative for appetite change, chills, fatigue and fever.  HENT: Negative for congestion, dental problem, ear pain and sore throat.   Eyes: Negative for discharge, redness and visual disturbance.  Respiratory:  Negative for cough, chest tightness, shortness of breath and wheezing.   Cardiovascular: Negative for chest pain, palpitations and leg swelling.  Gastrointestinal: Negative for abdominal pain, blood in stool, diarrhea, nausea and vomiting.  Genitourinary: Negative for difficulty urinating, dysuria, flank pain, frequency, hematuria and urgency.  Musculoskeletal: Negative for arthralgias, back pain, joint swelling, myalgias and neck stiffness.  Skin: Negative for pallor and rash.  Neurological: Negative for dizziness, speech difficulty, weakness and headaches.  Hematological: Negative for adenopathy. Does not bruise/bleed easily.  Psychiatric/Behavioral: Negative for confusion and sleep disturbance. The patient is not nervous/anxious.     PE; Blood pressure 138/82, pulse (!) 52, temperature 98.2 F (36.8 C), temperature source Oral, resp. rate 16, height 6' (1.829 m), weight 294 lb 4 oz (133.5 kg), SpO2 97 %. Gen: Alert, well appearing.  Patient is oriented to person, place, time, and situation. AFFECT: pleasant, lucid thought and speech. ENT: Ears: EACs clear, normal epithelium.  TMs with good light reflex and landmarks bilaterally.  Eyes: no injection, icteris, swelling, or exudate.  EOMI, PERRLA. Nose: no drainage or turbinate edema/swelling.  No injection or focal lesion.  Mouth: lips without lesion/swelling.  Oral mucosa pink and moist.  Dentition intact and without obvious caries or gingival swelling.  Oropharynx without erythema, exudate, or swelling.  Neck: supple/nontender.  No LAD, mass, or TM.  Carotid pulses 2+ bilaterally, without bruits. CV: RRR, no m/r/g.   LUNGS: CTA bilat, nonlabored resps, good aeration in all lung fields. ABD: soft, NT, ND, BS normal.  No hepatospenomegaly or mass.  No bruits. EXT: no clubbing, cyanosis, or edema.  Musculoskeletal: no joint swelling, erythema, warmth, or tenderness.  ROM of all joints intact. Skin - no sores or suspicious lesions or rashes or  color changes   Pertinent labs:  Lab Results  Component Value Date   TSH 1.46 05/12/2015   Lab Results  Component Value Date   WBC 5.1 05/12/2015   HGB 15.5 05/12/2015   HCT 45.7 05/12/2015   MCV 88.4 05/12/2015   PLT 283.0 05/12/2015   Lab Results  Component Value Date   CREATININE 1.01 05/12/2015   BUN 16 05/12/2015   NA 140 05/12/2015   K 4.1 05/12/2015   CL 105 05/12/2015   CO2 29 05/12/2015   Lab Results  Component Value Date   ALT 32 05/12/2015   AST 20 05/12/2015   ALKPHOS 81 05/12/2015   BILITOT 0.7 05/12/2015   Lab Results  Component Value Date   CHOL 245 (H) 05/12/2015   Lab Results  Component Value Date   HDL 40.50 05/12/2015   Lab Results  Component Value Date   LDLCALC 173 (H) 05/12/2015   Lab Results  Component Value Date   TRIG 158.0 (H) 05/12/2015   Lab Results  Component Value Date   CHOLHDL 6 05/12/2015     ASSESSMENT AND PLAN:   Health maintenance exam: Reviewed age and gender appropriate health maintenance issues (prudent diet, regular exercise, health risks of tobacco and excessive alcohol, use of seatbelts, fire alarms in home, use of sunscreen).  Also reviewed age and gender appropriate health screening as well as vaccine recommendations. Vaccines: UTD. Flu vaccine given today. Labs:fasting HP today.  An After Visit Summary was printed and given to the patient.  FOLLOW UP:  Return in about 1 year (around 11/26/2017) for annual CPE (fasting).  Signed:  Crissie Sickles, MD           11/26/2016

## 2016-11-26 NOTE — Addendum Note (Signed)
Addended by: Onalee Hua on: 11/26/2016 08:32 AM   Modules accepted: Orders

## 2016-12-14 DIAGNOSIS — S83211A Bucket-handle tear of medial meniscus, current injury, right knee, initial encounter: Secondary | ICD-10-CM | POA: Diagnosis not present

## 2016-12-25 DIAGNOSIS — S83251A Bucket-handle tear of lateral meniscus, current injury, right knee, initial encounter: Secondary | ICD-10-CM | POA: Diagnosis not present

## 2016-12-25 DIAGNOSIS — G4733 Obstructive sleep apnea (adult) (pediatric): Secondary | ICD-10-CM | POA: Diagnosis not present

## 2016-12-25 DIAGNOSIS — S83241A Other tear of medial meniscus, current injury, right knee, initial encounter: Secondary | ICD-10-CM | POA: Diagnosis not present

## 2016-12-25 DIAGNOSIS — M2241 Chondromalacia patellae, right knee: Secondary | ICD-10-CM | POA: Diagnosis not present

## 2017-01-30 DIAGNOSIS — M23200 Derangement of unspecified lateral meniscus due to old tear or injury, right knee: Secondary | ICD-10-CM | POA: Diagnosis not present

## 2017-01-30 DIAGNOSIS — M23261 Derangement of other lateral meniscus due to old tear or injury, right knee: Secondary | ICD-10-CM | POA: Diagnosis not present

## 2017-01-30 DIAGNOSIS — S83289A Other tear of lateral meniscus, current injury, unspecified knee, initial encounter: Secondary | ICD-10-CM | POA: Diagnosis not present

## 2017-01-30 DIAGNOSIS — M6588 Other synovitis and tenosynovitis, other site: Secondary | ICD-10-CM | POA: Diagnosis not present

## 2017-02-15 DIAGNOSIS — S76312S Strain of muscle, fascia and tendon of the posterior muscle group at thigh level, left thigh, sequela: Secondary | ICD-10-CM | POA: Insufficient documentation

## 2017-03-01 DIAGNOSIS — M25561 Pain in right knee: Secondary | ICD-10-CM | POA: Diagnosis not present

## 2017-03-08 DIAGNOSIS — Z9889 Other specified postprocedural states: Secondary | ICD-10-CM | POA: Diagnosis not present

## 2017-03-26 DIAGNOSIS — Z9889 Other specified postprocedural states: Secondary | ICD-10-CM | POA: Diagnosis not present

## 2017-03-29 DIAGNOSIS — Z9889 Other specified postprocedural states: Secondary | ICD-10-CM | POA: Insufficient documentation

## 2017-04-12 DIAGNOSIS — Z9889 Other specified postprocedural states: Secondary | ICD-10-CM | POA: Diagnosis not present

## 2017-10-28 DIAGNOSIS — G4733 Obstructive sleep apnea (adult) (pediatric): Secondary | ICD-10-CM | POA: Diagnosis not present

## 2017-12-28 DIAGNOSIS — Z23 Encounter for immunization: Secondary | ICD-10-CM | POA: Diagnosis not present

## 2018-01-10 ENCOUNTER — Ambulatory Visit (INDEPENDENT_AMBULATORY_CARE_PROVIDER_SITE_OTHER): Payer: 59 | Admitting: Pulmonary Disease

## 2018-01-10 ENCOUNTER — Encounter: Payer: Self-pay | Admitting: Pulmonary Disease

## 2018-01-10 ENCOUNTER — Institutional Professional Consult (permissible substitution): Payer: 59 | Admitting: Pulmonary Disease

## 2018-01-10 VITALS — BP 132/86 | HR 73 | Ht 73.0 in | Wt 299.8 lb

## 2018-01-10 DIAGNOSIS — G4733 Obstructive sleep apnea (adult) (pediatric): Secondary | ICD-10-CM

## 2018-01-10 DIAGNOSIS — Z9989 Dependence on other enabling machines and devices: Secondary | ICD-10-CM

## 2018-01-10 NOTE — Patient Instructions (Addendum)
History of obstructive sleep apnea  Multiple awakenings during the night  Plan will be to tighten the pressure range on the auto CPAP Change pressures to 7-12 from 5-15  we will try and get a download from your machine in about a month following changes Next step will be a fixed pressure if above is not working A low dose hypnotic will be an option if above is not helping  Option to titrate in the sleep lab will be explored if above fails  Continue weight loss efforts  Will see you back in the office in 3 months Call with any concerns, call if above is not working

## 2018-01-10 NOTE — Progress Notes (Signed)
Cody Cruz    [id number]    02-Jul-1975  Primary Care Physician:McGowen, Adrian Blackwater, MD  Referring Physician: Tammi Sou, MD [address] [place], [place] [address]  Chief complaint:   History of obstructive sleep apnea Multiple awakenings at night Fatigue in the early evenings  HPI:  With obstructive sleep apnea diagnosed in 2017 Has been on CPAP therapy CPAP works well, sleeps well with the machine However, wakes up 5-6 times at night No significant environmental factors contributing to awakenings, no significant pain discomfort causing awakenings No significant mask leak Patient spouse has said that he does have some snoring with the machine on Weight has not changed significantly lately He is currently on auto titrating settings of 5-15  He usually goes to bed about 10 PM, wakes up at 5-6 AM Has no significant difficulty falling asleep  Pets: dogs Smoking history:non Smoker  No outpatient encounter medications on file as of 01/10/2018.   No facility-administered encounter medications on file as of 01/10/2018.     Allergies as of 01/10/2018  . (No Known Allergies)    Past Medical History:  Diagnosis Date  . Hyperlipidemia    Mild, no meds required in past.  . Mild intermittent asthma    activity induced, typically.  Allergies trigger as a kid.  . Obesity, Class II, BMI 35-39.9   . OSA on CPAP     Past Surgical History:  Procedure Laterality Date  . APPENDECTOMY  1989  . TONSILLECTOMY AND ADENOIDECTOMY  1979    Family History  Problem Relation Age of Onset  . Arthritis Mother   . Hyperlipidemia Mother   . Hypertension Mother   . Hypertension Father     Social History   Socioeconomic History  . Marital status: Married    Spouse name: Not on file  . Number of children: Not on file  . Years of education: Not on file  . Highest education level: Not on file  Occupational History  . Not on file  Social Needs  .  Financial resource strain: Not on file  . Food insecurity:    Worry: Not on file    Inability: Not on file  . Transportation needs:    Medical: Not on file    Non-medical: Not on file  Tobacco Use  . Smoking status: Never Smoker  . Smokeless tobacco: Never Used  Substance and Sexual Activity  . Alcohol use: Yes  . Drug use: No  . Sexual activity: Not on file  Lifestyle  . Physical activity:    Days per week: Not on file    Minutes per session: Not on file  . Stress: Not on file  Relationships  . Social connections:    Talks on phone: Not on file    Gets together: Not on file    Attends religious service: Not on file    Active member of club or organization: Not on file    Attends meetings of clubs or organizations: Not on file    Relationship status: Not on file  . Intimate partner violence:    Fear of current or ex partner: Not on file    Emotionally abused: Not on file    Physically abused: Not on file    Forced sexual activity: Not on file  Other Topics Concern  . Not on file  Social History Narrative   Married, two children (one son and one daughter).  Education: [name] [employer].   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   No tobacco.  Alc 3-4 beers per week avg.  No alc/drugs.   Exercise: CV and wt's at a GYM 3-5 days a week when feet are not hurting him.    Review of Systems  Constitutional: Positive for fatigue.  HENT: Negative.   Eyes: Negative.   Respiratory: Positive for apnea. Negative for shortness of breath.   Cardiovascular: Negative.   Endocrine: Negative.   Genitourinary: Negative.   Musculoskeletal: Negative.   Skin: Negative.   Psychiatric/Behavioral: Positive for sleep disturbance.    Vitals:   01/10/18 0918  BP: 132/86  Pulse: 73  SpO2: 96%     Physical Exam  Constitutional: He is oriented to person, place, and time. He appears well-developed and well-nourished. No distress.  Obese  HENT:  Head: Normocephalic  and atraumatic.  Crowded oropharynx, Mallampati 3  Eyes: Pupils are equal, round, and reactive to light. Conjunctivae and EOM are normal. Right eye exhibits no discharge. Left eye exhibits no discharge.  Neck: Normal range of motion. Neck supple. No tracheal deviation present. No thyromegaly present.  Cardiovascular: Normal rate and regular rhythm.  Pulmonary/Chest: Breath sounds normal. No respiratory distress. He has no wheezes.  Abdominal: Soft. Bowel sounds are normal. He exhibits no distension.  Musculoskeletal: Normal range of motion. He exhibits no edema.  Neurological: He is alert and oriented to person, place, and time. No cranial nerve deficit.  Skin: Skin is warm and dry. He is not diaphoretic.  Psychiatric: He has a normal mood and affect. His behavior is normal.   Data Reviewed: Sleep study results reviewed Compliance data does reveal residual AHI of 1  Assessment:  Obstructive sleep apnea -Appears adequately treated based on compliance data  Multiple awakenings at night -This may be related to pressure changes, may also be related to snoring  Snoring  Fatigue  Plan/Recommendations:  We will make changes to his pressure requirement at present we will fix his pressure between 7 - 12  If above does not work, a fixed pressure of  10 may be tried as his 95 percentile pressure requirement is 9.3  Optimization of sleep position-lateral position sleep, elevation of the head of the bed may help  I will see him back in the office in about 3 months  We will get downloads from his machine in about a month  Encouraged to call if any significant concerns or if above changes are not helping     Sherrilyn Rist MD [facility] 01/10/2018, 9:41 AM  CC: McGowen, Adrian Blackwater, MD

## 2018-04-05 DIAGNOSIS — J4521 Mild intermittent asthma with (acute) exacerbation: Secondary | ICD-10-CM | POA: Diagnosis not present

## 2018-04-18 ENCOUNTER — Ambulatory Visit (INDEPENDENT_AMBULATORY_CARE_PROVIDER_SITE_OTHER): Payer: 59 | Admitting: Pulmonary Disease

## 2018-04-18 ENCOUNTER — Encounter: Payer: Self-pay | Admitting: Pulmonary Disease

## 2018-04-18 VITALS — BP 120/82 | HR 74 | Ht 72.0 in | Wt 301.0 lb

## 2018-04-18 DIAGNOSIS — G4733 Obstructive sleep apnea (adult) (pediatric): Secondary | ICD-10-CM

## 2018-04-18 DIAGNOSIS — Z9989 Dependence on other enabling machines and devices: Secondary | ICD-10-CM | POA: Diagnosis not present

## 2018-04-18 MED ORDER — ESZOPICLONE 2 MG PO TABS: 2.0000 mg | ORAL_TABLET | Freq: Every evening | ORAL | 5 refills | Status: DC | PRN

## 2018-04-18 NOTE — Patient Instructions (Signed)
Obstructive sleep apnea adequately treated with CPAP therapy Multiple awakenings  We will try eszopiclone 2 mg at night If not working optimally, we can increase to 3 mg Be cognizant of the risk of groggy awakenings if you do not get enough hours of sleep  I will see you back in the office in about 3 months Continue to use her CPAP on a regular basis  Call with any significant concerns

## 2018-04-18 NOTE — Progress Notes (Signed)
Cody Cruz    [id number]    Feb 23, 1976  Primary Care Physician:McGowen, Adrian Blackwater, MD  Referring Physician: Tammi Sou, MD [id number] [place], [place] [address]  Chief complaint:   History of obstructive sleep apnea Multiple awakenings at night Fatigue in the early evenings CPAP appears to be working well  HPI:  With obstructive sleep apnea diagnosed in 2017 Has been on CPAP therapy CPAP works well, sleeps well with the machine However, wakes up 5-6 times at night-this continues to be persistent despite CPAP use No significant environmental factors contributing to awakenings, no significant pain discomfort causing awakenings No significant mask leak Patient spouse has said that he does have some snoring with the machine on Weight remains about the same He is currently on auto titrating settings of 5-15  He usually goes to bed about 10 PM, wakes up at 5-6 AM Has no significant difficulty falling asleep  Pets: dogs Smoking history:non Smoker  No outpatient encounter medications on file as of 04/18/2018.   No facility-administered encounter medications on file as of 04/18/2018.     Allergies as of 04/18/2018  . (No Known Allergies)    Past Medical History:  Diagnosis Date  . Hyperlipidemia    Mild, no meds required in past.  . Mild intermittent asthma    activity induced, typically.  Allergies trigger as a kid.  . Obesity, Class II, BMI 35-39.9   . OSA on CPAP     Past Surgical History:  Procedure Laterality Date  . APPENDECTOMY  1989  . TONSILLECTOMY AND ADENOIDECTOMY  1979    Family History  Problem Relation Age of Onset  . Arthritis Mother   . Hyperlipidemia Mother   . Hypertension Mother   . Hypertension Father     Social History   Socioeconomic History  . Marital status: Married    Spouse name: Not on file  . Number of children: Not on file  . Years of education: Not on file  . Highest education level: Not on file    Occupational History  . Not on file  Social Needs  . Financial resource strain: Not on file  . Food insecurity:    Worry: Not on file    Inability: Not on file  . Transportation needs:    Medical: Not on file    Non-medical: Not on file  Tobacco Use  . Smoking status: Never Smoker  . Smokeless tobacco: Never Used  Substance and Sexual Activity  . Alcohol use: Yes  . Drug use: No  . Sexual activity: Not on file  Lifestyle  . Physical activity:    Days per week: Not on file    Minutes per session: Not on file  . Stress: Not on file  Relationships  . Social connections:    Talks on phone: Not on file    Gets together: Not on file    Attends religious service: Not on file    Active member of club or organization: Not on file    Attends meetings of clubs or organizations: Not on file    Relationship status: Not on file  . Intimate partner violence:    Fear of current or ex partner: Not on file    Emotionally abused: Not on file    Physically abused: Not on file    Forced sexual activity: Not on file  Other Topics Concern  . Not on file  Social  History Narrative   Married, two children (one son and one daughter).   Education: [name].   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   No tobacco.  Alc 3-4 beers per week avg.  No alc/drugs.   Exercise: CV and wt's at a GYM 3-5 days a week when feet are not hurting him.    Review of Systems  Constitutional: Positive for fatigue.  Eyes: Negative.   Respiratory: Positive for apnea. Negative for shortness of breath.   Endocrine: Negative.   Skin: Negative.   Psychiatric/Behavioral: Positive for sleep disturbance.  All other systems reviewed and are negative.   There were no vitals filed for this visit.   Physical Exam  Constitutional: He appears well-developed and well-nourished. No distress.  Obese  HENT:  Head: Normocephalic and atraumatic.  Crowded oropharynx, Mallampati 3  Eyes: Pupils are  equal, round, and reactive to light. Conjunctivae and EOM are normal. Right eye exhibits no discharge. Left eye exhibits no discharge.  Neck: Normal range of motion. Neck supple. No tracheal deviation present. No thyromegaly present.  Cardiovascular: Normal rate and regular rhythm.  Pulmonary/Chest: Breath sounds normal. No respiratory distress. He has no wheezes.  Abdominal: Soft. Bowel sounds are normal. He exhibits no distension.  Skin: He is not diaphoretic.   Data Reviewed: Sleep study results reviewed Compliance data does reveal residual AHI of 1.7-97% compliance  Assessment:  Obstructive sleep apnea -Appears adequately treated based on compliance data  Multiple awakenings at night -This may be related to pressure changes, may also be related to snoring -Feels machine is working well but despite this still wakes up many times  Snoring  Fatigue  Plan/Recommendations:  We will try a sleep aid that helps promote consolidated sleep-eszopiclone 2 mg nightly  Continue using CPAP on a regular basis Optimization of sleep position-lateral position sleep, elevation of the head of the bed may help  I will see him back in the office in about 6 months  Encouraged to call if any significant concerns or if above changes are not helping     Sherrilyn Rist MD [facility] 04/18/2018, 9:03 AM  CC: McGowen, Adrian Blackwater, MD

## 2018-05-09 ENCOUNTER — Encounter: Payer: Self-pay | Admitting: Family Medicine

## 2019-01-18 DIAGNOSIS — M722 Plantar fascial fibromatosis: Secondary | ICD-10-CM

## 2019-01-18 HISTORY — DX: Plantar fascial fibromatosis: M72.2

## 2019-01-30 ENCOUNTER — Other Ambulatory Visit: Payer: Self-pay

## 2019-01-30 ENCOUNTER — Encounter: Payer: Self-pay | Admitting: Podiatry

## 2019-01-30 ENCOUNTER — Ambulatory Visit (INDEPENDENT_AMBULATORY_CARE_PROVIDER_SITE_OTHER): Payer: 59

## 2019-01-30 ENCOUNTER — Ambulatory Visit (INDEPENDENT_AMBULATORY_CARE_PROVIDER_SITE_OTHER): Payer: 59 | Admitting: Podiatry

## 2019-01-30 VITALS — BP 143/87 | HR 63 | Resp 16

## 2019-01-30 DIAGNOSIS — M722 Plantar fascial fibromatosis: Secondary | ICD-10-CM

## 2019-01-30 MED ORDER — DICLOFENAC SODIUM 75 MG PO TBEC: 75.0000 mg | DELAYED_RELEASE_TABLET | Freq: Two times a day (BID) | ORAL | 2 refills | Status: DC

## 2019-01-30 NOTE — Patient Instructions (Signed)

## 2019-02-03 NOTE — Progress Notes (Signed)
Subjective:   Patient ID: Cody Cruz, male   DOB: 43 y.o.   MRN: [id number]   HPI Patient states he has had chronic problems with heel pain left and states that he has tried orthotics in the past without relief and he wears Brooks shoes and its been going on for a long time worse over these last 4 months.  Patient does not smoke likes to be active   Review of Systems  All other systems reviewed and are negative.       Objective:  Physical Exam Vitals signs and nursing note reviewed.  Constitutional:      Appearance: He is well-developed.  Pulmonary:     Effort: Pulmonary effort is normal.  Musculoskeletal: Normal range of motion.  Skin:    General: Skin is warm.  Neurological:     Mental Status: He is alert.     Neurovascular status intact muscle strength found to be adequate range of motion within normal limits.  Patient is noted to have quite a bit of discomfort in the left plantar fascia at the insertional point of the tendon into the calcaneus with moderate depression of the arch     Assessment:  Acute plantar fasciitis left that so far has failed to respond to care     Plan:  H&P x-ray reviewed and I did go ahead today did sterile prep and injected the fascia 3 mg Kenalog 5 mg Xylocaine applied fascial brace to lift the arch.  We will see results and decide what else may be appropriate for patient  X-ray indicates there is small spur no indication to stress fracture arthritis

## 2019-02-20 ENCOUNTER — Encounter: Payer: Self-pay | Admitting: Podiatry

## 2019-02-20 ENCOUNTER — Other Ambulatory Visit: Payer: Self-pay

## 2019-02-20 ENCOUNTER — Ambulatory Visit (INDEPENDENT_AMBULATORY_CARE_PROVIDER_SITE_OTHER): Payer: 59 | Admitting: Podiatry

## 2019-02-20 DIAGNOSIS — M722 Plantar fascial fibromatosis: Secondary | ICD-10-CM

## 2019-02-23 NOTE — Progress Notes (Signed)
Subjective:   Patient ID: Cody Cruz, male   DOB: 43 y.o.   MRN: [id number]   HPI Patient presents still having quite a bit of pain with his heels and states that he feels like he is getting need some type of long-term support with obesity as a complicating factor   ROS      Objective:  Physical Exam  Neurovascular status with patient has had 10-year history of plantar fasciitis who I been able to reduce the acute symptoms with but continues to have chronic problems and has had this for a number of years and it has not affected his ability to be active with patient having attempted several orthotics in the past     Assessment:  Chronic plantar fasciitis of the heel left over right     Plan:  H&P reviewed condition at great length and at this point discussed the acute versus chronic nature of plantar fasciitis.  At this point I went ahead and I am going to send him to [facility] orthotist for orthotics and I recommended heel lift to be incorporated into both as we try to get his chronic discomfort under control.  I also dispensed a night splint with all instructions on usage

## 2019-03-05 ENCOUNTER — Other Ambulatory Visit: Payer: Self-pay

## 2019-03-05 ENCOUNTER — Ambulatory Visit (INDEPENDENT_AMBULATORY_CARE_PROVIDER_SITE_OTHER): Payer: Self-pay | Admitting: Orthotics

## 2019-03-05 DIAGNOSIS — M722 Plantar fascial fibromatosis: Secondary | ICD-10-CM

## 2019-03-05 DIAGNOSIS — S76312S Strain of muscle, fascia and tendon of the posterior muscle group at thigh level, left thigh, sequela: Secondary | ICD-10-CM

## 2019-03-05 NOTE — Progress Notes (Signed)

## 2019-03-24 ENCOUNTER — Encounter: Payer: Self-pay | Admitting: Family Medicine

## 2019-04-14 ENCOUNTER — Other Ambulatory Visit: Payer: 59 | Admitting: Orthotics

## 2019-04-22 ENCOUNTER — Telehealth: Payer: Self-pay | Admitting: Podiatry

## 2019-04-22 NOTE — Telephone Encounter (Signed)
Pt left message returning my call to schedule an appt to pick up orthotics.  I returned call and had to leave a message

## 2019-04-28 ENCOUNTER — Other Ambulatory Visit: Payer: Self-pay

## 2019-04-28 ENCOUNTER — Encounter: Payer: 59 | Admitting: Orthotics

## 2019-07-18 DIAGNOSIS — R079 Chest pain, unspecified: Secondary | ICD-10-CM

## 2019-07-18 HISTORY — DX: Chest pain, unspecified: R07.9

## 2019-07-30 ENCOUNTER — Other Ambulatory Visit: Payer: Self-pay

## 2019-07-30 ENCOUNTER — Emergency Department (HOSPITAL_BASED_OUTPATIENT_CLINIC_OR_DEPARTMENT_OTHER): Payer: 59

## 2019-07-30 ENCOUNTER — Telehealth: Payer: Self-pay

## 2019-07-30 ENCOUNTER — Observation Stay (HOSPITAL_BASED_OUTPATIENT_CLINIC_OR_DEPARTMENT_OTHER)
Admission: EM | Admit: 2019-07-30 | Discharge: 2019-07-31 | Disposition: A | Discharge status: Home / Self Care | Payer: 59 | Attending: Cardiology | Admitting: Cardiology

## 2019-07-30 ENCOUNTER — Encounter (HOSPITAL_BASED_OUTPATIENT_CLINIC_OR_DEPARTMENT_OTHER): Payer: Self-pay | Admitting: *Deleted

## 2019-07-30 DIAGNOSIS — Z20822 Contact with and (suspected) exposure to covid-19: Secondary | ICD-10-CM | POA: Insufficient documentation

## 2019-07-30 DIAGNOSIS — I129 Hypertensive chronic kidney disease with stage 1 through stage 4 chronic kidney disease, or unspecified chronic kidney disease: Secondary | ICD-10-CM | POA: Insufficient documentation

## 2019-07-30 DIAGNOSIS — R0789 Other chest pain: Secondary | ICD-10-CM

## 2019-07-30 DIAGNOSIS — Z79899 Other long term (current) drug therapy: Secondary | ICD-10-CM | POA: Insufficient documentation

## 2019-07-30 DIAGNOSIS — G4733 Obstructive sleep apnea (adult) (pediatric): Secondary | ICD-10-CM | POA: Diagnosis present

## 2019-07-30 DIAGNOSIS — R079 Chest pain, unspecified: Secondary | ICD-10-CM | POA: Diagnosis present

## 2019-07-30 DIAGNOSIS — R4781 Slurred speech: Secondary | ICD-10-CM | POA: Diagnosis present

## 2019-07-30 DIAGNOSIS — N183 Chronic kidney disease, stage 3 unspecified: Secondary | ICD-10-CM | POA: Diagnosis not present

## 2019-07-30 DIAGNOSIS — E785 Hyperlipidemia, unspecified: Secondary | ICD-10-CM | POA: Diagnosis not present

## 2019-07-30 DIAGNOSIS — I10 Essential (primary) hypertension: Secondary | ICD-10-CM

## 2019-07-30 DIAGNOSIS — G459 Transient cerebral ischemic attack, unspecified: Principal | ICD-10-CM | POA: Insufficient documentation

## 2019-07-30 LAB — CBC
HCT: 47.1 % (ref 39.0–52.0)
Hemoglobin: 16.2 g/dL (ref 13.0–17.0)
MCH: 30.1 pg (ref 26.0–34.0)
MCHC: 34.4 g/dL (ref 30.0–36.0)
MCV: 87.5 fL (ref 80.0–100.0)
Platelets: 231 10*3/uL (ref 150–400)
RBC: 5.38 MIL/uL (ref 4.22–5.81)
RDW: 12 % (ref 11.5–15.5)
WBC: 5.7 10*3/uL (ref 4.0–10.5)
nRBC: 0 % (ref 0.0–0.2)

## 2019-07-30 LAB — D-DIMER, QUANTITATIVE: D-Dimer, Quant: <0.27 ug/mL-FEU (ref 0.00–0.50)

## 2019-07-30 LAB — TROPONIN I (HIGH SENSITIVITY)
Troponin I (High Sensitivity): 2 ng/L (ref <18)
Troponin I (High Sensitivity): <2 ng/L (ref <18)

## 2019-07-30 LAB — BASIC METABOLIC PANEL
Anion gap: 12 (ref 5–15)
BUN: 17 mg/dL (ref 6–20)
CO2: 26 mmol/L (ref 22–32)
Calcium: 9.1 mg/dL (ref 8.9–10.3)
Chloride: 99 mmol/L (ref 98–111)
Creatinine, Ser: 1.12 mg/dL (ref 0.61–1.24)
GFR calc Af Amer: >60 mL/min (ref >60)
GFR calc non Af Amer: >60 mL/min (ref >60)
Glucose, Bld: 148 mg/dL — ABNORMAL HIGH (ref 70–99)
Potassium: 3.8 mmol/L (ref 3.5–5.1)
Sodium: 137 mmol/L (ref 135–145)

## 2019-07-30 LAB — SARS CORONAVIRUS 2 BY RT PCR (HOSPITAL ORDER, PERFORMED IN ~~LOC~~ HOSPITAL LAB): SARS Coronavirus 2: NEGATIVE

## 2019-07-30 MED ORDER — ASPIRIN 81 MG PO CHEW
324.0000 mg | CHEWABLE_TABLET | Freq: Once | ORAL | Status: AC
  Administered 2019-07-30: 324 mg via ORAL
  Filled 2019-07-30: qty 4

## 2019-07-30 MED ORDER — SODIUM CHLORIDE 0.9% FLUSH
3.0000 mL | Freq: Once | INTRAVENOUS | Status: DC
  Filled 2019-07-30: qty 3

## 2019-07-30 NOTE — Telephone Encounter (Signed)
Pt called and stated his BP was 167/110, twitching in the L eye, tightness in chest. Pt was advised he needed to go be evaluated at the ED for his symptoms and tx. He agreed with plan. Pt advised is had been over two years since he had been seen and to call back and schedule F/U visit with Dr Anitra Lauth after ED visit. He agreed.

## 2019-07-30 NOTE — ED Triage Notes (Signed)
Earlier today left eye began twitching, began having chest tightness, mid sternal non-radiating chest pain, had some SOB, denies N/V, states he was sitting down eating lunch, states tight chest feeling has been going on for the past 2 days. Has strong family hx of cardiac issues, hx of htn

## 2019-07-30 NOTE — ED Provider Notes (Signed)
[facility] EMERGENCY DEPARTMENT Provider Note   CSN: [id number] Arrival date & time: 07/30/19  1405     History Chief Complaint  Patient presents with  . Chest Pain    Cody Cruz is a 44 y.o. male.  Pt reports some chest tightness on and off for 2 months.  Pt reports he noticed increased tightness and shortness of breath while running in airport in [place]. Pt concerned because multiple family members have heart disease. Pt reports slight tightness currently.  Pt called [facility] and was advised to come in. Pt is off his blood pressure medications   The history is provided by the patient. No language interpreter was used.  Chest Pain Pain location:  L chest Pain quality: aching and tightness   Pain radiates to:  Does not radiate Pain severity:  Mild Onset quality:  Gradual Duration:  2 months Timing:  Intermittent Progression:  Waxing and waning Chronicity:  New Context: not breathing   Worsened by:  Nothing Ineffective treatments:  None tried Associated symptoms: no nausea   Risk factors: high cholesterol and hypertension        Past Medical History:  Diagnosis Date  . Hyperlipidemia    Mild, no meds required in past.  . Mild intermittent asthma    activity induced, typically.  Allergies trigger as a kid.  . Obesity, Class II, BMI 35-39.9   . OSA on CPAP    [place] [facility]  . Plantar fasciitis 01/2019   steroid inj, Dr. Paulla Dolly    Patient Active Problem List   Diagnosis Date Noted  . S/P right knee arthroscopy 03/29/2017  . Hamstring strain, left, sequela 02/15/2017  . OSA (obstructive sleep apnea) 10/20/2015  . Hypersomnia 07/11/2015  . Obesity 07/11/2015  . Asthma 07/11/2015  . Hyperlipidemia 05/16/2015  . 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 Nonallopathic lesion of lower extremities 04/21/2014  . Pes planus of both feet 10/30/2013  . Health maintenance examination 07/07/2013  . Plantar fasciitis, bilateral 07/07/2013  .  Achilles tendon pain 07/07/2013  . Onychomycosis 07/07/2013    Past Surgical History:  Procedure Laterality Date  . APPENDECTOMY  1989  . TONSILLECTOMY AND ADENOIDECTOMY  1979       Family History  Problem Relation Age of Onset  . Arthritis Mother   . Hyperlipidemia Mother   . Hypertension Mother   . Hypertension Father     Social History   Tobacco Use  . Smoking status: Never Smoker  . Smokeless tobacco: Never Used  Substance Use Topics  . Alcohol use: Yes  . Drug use: No    Home Medications Prior to Admission medications   Medication Sig Start Date End Date Taking? Authorizing Provider  diclofenac (VOLTAREN) 75 MG EC tablet Take 1 tablet (75 mg total) by mouth 2 (two) times daily. 01/30/19   Wallene Huh, DPM    Allergies    Patient has no known allergies.  Review of Systems   Review of Systems  Cardiovascular: Positive for chest pain.  Gastrointestinal: Negative for nausea.  All other systems reviewed and are negative.   Physical Exam Updated Vital Signs BP (!) 156/93 (BP Location: Right Arm)   Pulse 84   Temp 98.5 F (36.9 C) (Oral)   Resp 18   Ht 6\' 1"  (1.854 m)   Wt (!) 138.3 kg   SpO2 98%   BMI 40.24 kg/m   Physical Exam Vitals and nursing note reviewed.  Constitutional:  Appearance: He is well-developed.  HENT:     Head: Normocephalic and atraumatic.  Eyes:     Conjunctiva/sclera: Conjunctivae normal.  Cardiovascular:     Rate and Rhythm: Normal rate and regular rhythm.     Heart sounds: No murmur.  Pulmonary:     Effort: Pulmonary effort is normal. No respiratory distress.     Breath sounds: Normal breath sounds.  Abdominal:     Palpations: Abdomen is soft.     Tenderness: There is no abdominal tenderness.  Musculoskeletal:        General: Normal range of motion.     Cervical back: Neck supple.  Skin:    General: Skin is warm and dry.  Neurological:     General: No focal deficit present.     Mental Status: He is alert.   Psychiatric:        Mood and Affect: Mood normal.        Behavior: Behavior normal.     ED Results / Procedures / Treatments   Labs (all labs ordered are listed, but only abnormal results are displayed) Labs Reviewed  BASIC METABOLIC PANEL - Abnormal; Notable for the following components:      Result Value   Glucose, Bld 148 (*)    All other components within normal limits  CBC  TROPONIN I (HIGH SENSITIVITY)  TROPONIN I (HIGH SENSITIVITY)    EKG EKG Interpretation  Date/Time:  Thursday Jul 30 2019 14:13:50 EDT Ventricular Rate:  78 PR Interval:    QRS Duration: 110 QT Interval:  411 QTC Calculation: 469 R Axis:   -35 Text Interpretation: Sinus rhythm Left axis deviation Low voltage, precordial leads Consider anterior infarct Baseline wander in lead(s) V1 No previous tracing Confirmed by Blanchie Dessert [phone]) on 07/30/2019 2:51:24 PM   Radiology DG Chest 2 View  Result Date: 07/30/2019 CLINICAL DATA:  Chest pain. Additional history provided: Elevated blood pressure, tightness in chest, history of mild intermittent asthma. EXAM: CHEST - 2 VIEW COMPARISON:  No pertinent prior studies available for comparison. FINDINGS: Heart size within normal limits. There is no appreciable airspace consolidation. No evidence of pleural effusion or pneumothorax. No acute bony abnormality identified. IMPRESSION: No evidence of acute cardiopulmonary abnormality. Electronically Signed   By: Kellie Simmering DO   On: 07/30/2019 15:13    Procedures Procedures (including critical care time)  Medications Ordered in ED Medications  sodium chloride flush (NS) 0.9 % injection 3 mL (3 mLs Intravenous Not Given 07/30/19 1428)  aspirin chewable tablet 324 mg (324 mg Oral Given 07/30/19 1540)    ED Course  I have reviewed the triage vital signs and the nursing notes.  Pertinent labs & imaging results that were available during my care of the patient were reviewed by me and considered in my medical  decision making (see chart for details).    MDM Rules/Calculators/A&P                      MDM: Pt given asa. Pt's blood pressure improved with rest. Troponin is negative x 2.  ddimer is negative. Chest xray is normal   I spoke with the Cardiology PA who will talk with cardiologist  Final Clinical Impression(s) / ED Diagnoses Final diagnoses:  Chest tightness  Essential hypertension    Rx / DC Orders ED Discharge Orders    None       Sidney Ace 07/30/19 1801    Gareth Morgan, MD 07/31/19 2208

## 2019-07-31 ENCOUNTER — Observation Stay (HOSPITAL_BASED_OUTPATIENT_CLINIC_OR_DEPARTMENT_OTHER): Payer: 59

## 2019-07-31 ENCOUNTER — Ambulatory Visit (HOSPITAL_COMMUNITY): Payer: 59

## 2019-07-31 DIAGNOSIS — R079 Chest pain, unspecified: Secondary | ICD-10-CM

## 2019-07-31 DIAGNOSIS — N183 Chronic kidney disease, stage 3 unspecified: Secondary | ICD-10-CM | POA: Diagnosis not present

## 2019-07-31 DIAGNOSIS — I10 Essential (primary) hypertension: Secondary | ICD-10-CM

## 2019-07-31 DIAGNOSIS — R0789 Other chest pain: Secondary | ICD-10-CM

## 2019-07-31 DIAGNOSIS — I129 Hypertensive chronic kidney disease with stage 1 through stage 4 chronic kidney disease, or unspecified chronic kidney disease: Secondary | ICD-10-CM | POA: Diagnosis not present

## 2019-07-31 DIAGNOSIS — E785 Hyperlipidemia, unspecified: Secondary | ICD-10-CM | POA: Diagnosis not present

## 2019-07-31 DIAGNOSIS — G459 Transient cerebral ischemic attack, unspecified: Secondary | ICD-10-CM | POA: Diagnosis not present

## 2019-07-31 HISTORY — PX: TRANSTHORACIC ECHOCARDIOGRAM: SHX275

## 2019-07-31 HISTORY — PX: CARDIOVASCULAR STRESS TEST: SHX262

## 2019-07-31 LAB — CBC
HCT: 44.6 % (ref 39.0–52.0)
Hemoglobin: 15.4 g/dL (ref 13.0–17.0)
MCH: 30.4 pg (ref 26.0–34.0)
MCHC: 34.5 g/dL (ref 30.0–36.0)
MCV: 88.1 fL (ref 80.0–100.0)
Platelets: 224 10*3/uL (ref 150–400)
RBC: 5.06 MIL/uL (ref 4.22–5.81)
RDW: 12.1 % (ref 11.5–15.5)
WBC: 6.3 10*3/uL (ref 4.0–10.5)
nRBC: 0 % (ref 0.0–0.2)

## 2019-07-31 LAB — COMPREHENSIVE METABOLIC PANEL
ALT: 54 U/L — ABNORMAL HIGH (ref 0–44)
AST: 32 U/L (ref 15–41)
Albumin: 3.7 g/dL (ref 3.5–5.0)
Alkaline Phosphatase: 60 U/L (ref 38–126)
Anion gap: 9 (ref 5–15)
BUN: 16 mg/dL (ref 6–20)
CO2: 24 mmol/L (ref 22–32)
Calcium: 9.1 mg/dL (ref 8.9–10.3)
Chloride: 107 mmol/L (ref 98–111)
Creatinine, Ser: 1.22 mg/dL (ref 0.61–1.24)
GFR calc Af Amer: >60 mL/min (ref >60)
GFR calc non Af Amer: >60 mL/min (ref >60)
Glucose, Bld: 104 mg/dL — ABNORMAL HIGH (ref 70–99)
Potassium: 4 mmol/L (ref 3.5–5.1)
Sodium: 140 mmol/L (ref 135–145)
Total Bilirubin: 1.2 mg/dL (ref 0.3–1.2)
Total Protein: 6.3 g/dL — ABNORMAL LOW (ref 6.5–8.1)

## 2019-07-31 LAB — HEMOGLOBIN A1C
Hgb A1c MFr Bld: 5.5 % (ref 4.8–5.6)
Mean Plasma Glucose: 111.15 mg/dL

## 2019-07-31 LAB — NM MYOCAR MULTI W/SPECT W/WALL MOTION / EF
MPHR: 175 {beats}/min
Peak HR: 81 {beats}/min
Percent HR: 46 %
Rest HR: 54 {beats}/min

## 2019-07-31 LAB — LIPID PANEL
Cholesterol: 263 mg/dL — ABNORMAL HIGH (ref 0–200)
HDL: 34 mg/dL — ABNORMAL LOW (ref >40)
LDL Cholesterol: 174 mg/dL — ABNORMAL HIGH (ref 0–99)
Total CHOL/HDL Ratio: 7.7 RATIO
Triglycerides: 274 mg/dL — ABNORMAL HIGH (ref <150)
VLDL: 55 mg/dL — ABNORMAL HIGH (ref 0–40)

## 2019-07-31 LAB — ECHOCARDIOGRAM COMPLETE
Height: 73 in
Weight: 4716.08 oz

## 2019-07-31 MED ORDER — ASPIRIN 81 MG PO CHEW: 324.0000 mg | CHEWABLE_TABLET | ORAL | Status: DC

## 2019-07-31 MED ORDER — REGADENOSON 0.4 MG/5ML IV SOLN
0.4000 mg | Freq: Once | INTRAVENOUS | Status: AC
  Administered 2019-07-31: 0.4 mg via INTRAVENOUS

## 2019-07-31 MED ORDER — ASPIRIN EC 81 MG PO TBEC: 81.0000 mg | DELAYED_RELEASE_TABLET | Freq: Every day | ORAL | Status: DC

## 2019-07-31 MED ORDER — LOSARTAN POTASSIUM 25 MG PO TABS: 25.0000 mg | ORAL_TABLET | Freq: Every day | ORAL | Status: DC

## 2019-07-31 MED ORDER — TECHNETIUM TC 99M TETROFOSMIN IV KIT
10.7800 | PACK | Freq: Once | INTRAVENOUS | Status: AC | PRN
  Administered 2019-07-31: 10.78 via INTRAVENOUS

## 2019-07-31 MED ORDER — ROSUVASTATIN CALCIUM 20 MG PO TABS: 20.0000 mg | ORAL_TABLET | Freq: Every day | ORAL | 3 refills | Status: DC

## 2019-07-31 MED ORDER — REGADENOSON 0.4 MG/5ML IV SOLN
INTRAVENOUS | Status: AC
  Filled 2019-07-31: qty 5

## 2019-07-31 MED ORDER — HEPARIN SODIUM (PORCINE) 5000 UNIT/ML IJ SOLN
5000.0000 [IU] | Freq: Three times a day (TID) | INTRAMUSCULAR | Status: DC
  Administered 2019-07-31 (×2): 5000 [IU] via SUBCUTANEOUS
  Filled 2019-07-31 (×2): qty 1

## 2019-07-31 MED ORDER — ROSUVASTATIN CALCIUM 20 MG PO TABS: 20.0000 mg | ORAL_TABLET | Freq: Every day | ORAL | Status: DC

## 2019-07-31 MED ORDER — LOSARTAN POTASSIUM 25 MG PO TABS: 25.0000 mg | ORAL_TABLET | Freq: Every day | ORAL | 3 refills | Status: DC

## 2019-07-31 MED ORDER — ACETAMINOPHEN 325 MG PO TABS
650.0000 mg | ORAL_TABLET | ORAL | Status: DC | PRN
  Administered 2019-07-31: 650 mg via ORAL
  Filled 2019-07-31: qty 2

## 2019-07-31 MED ORDER — ASPIRIN 300 MG RE SUPP: 300.0000 mg | RECTAL | Status: DC

## 2019-07-31 MED ORDER — TECHNETIUM TC 99M TETROFOSMIN IV KIT
31.2000 | PACK | Freq: Once | INTRAVENOUS | Status: AC | PRN
  Administered 2019-07-31: 31.2 via INTRAVENOUS

## 2019-07-31 MED ORDER — ASPIRIN 81 MG PO TBEC: 81.0000 mg | DELAYED_RELEASE_TABLET | Freq: Every day | ORAL | 3 refills | Status: DC

## 2019-07-31 NOTE — Progress Notes (Signed)
Final reports on echo and myovue pending I have reviewed both studies  TTE: no RWMAls EF normal no significant valve disease or pericardial effusion normal aortic root Myovue normal non ischemic EF 5#%  Ok to d/c home  Needs to work on weight loss  I will see as outpatient in 2-4 weeks  Jenkins Rouge MD [facility]

## 2019-07-31 NOTE — Progress Notes (Signed)
Echocardiogram 2D Echocardiogram has been performed.  [name] [name] 07/31/2019, 12:14 PM

## 2019-07-31 NOTE — H&P (Signed)
Cardiology Admission History and Physical:   Patient ID: Cody Cruz MRN: [id number]; DOB: 1975-05-09   Admission date: 07/30/2019  Primary Care Provider: Tammi Sou, MD Primary Cardiologist: No primary care provider on file.  Primary Electrophysiologist:  None   Chief Complaint:  Chest tightness.  Patient Profile:   Cody Cruz is a 44 y.o. male with untreated HTN and OSA who presents with worsening chest tightness.   History of Present Illness:   Cody Cruz has been experiencing intermittent chest "tightness" x 1-2 months. He can't identify any reliable aggravating or alleviating factors, but did feel a bit more tightness while walking in an airport recently. With the tightness he denies SOB, palpitations, diaphoresis, nausea. Has had increased post-nasal drip and some cough recently, but pain not pleuritic in nature and not reproducible with palpation of the sternum. Earlier today he again noted chest tightness and checked his BP, which was 160s/100s. He tried to make an appointment to see his PCP to refill his antihypertensives (as he had been off of these) but they referred him to the ER.   Upon arrival, patient HTNsive to 156-93, HR 84. EKG without ischemic changes and hsTn negative x 2. Labs otherwise unremarkable. He was transferred to [facility] for further ischemic eval.   On ROS, no orthopnea, PND, LE edema. No fevers, chills. Cough as mentioned.   Does have strong family history of CAD in grandfather and uncles, but no first degree relatives.    Past Medical History:  Diagnosis Date  . Hyperlipidemia    Mild, no meds required in past.  . Mild intermittent asthma    activity induced, typically.  Allergies trigger as a kid.  . Obesity, Class II, BMI 35-39.9   . OSA on CPAP    [place] pul[facility]  . Plantar fasciitis 01/2019   steroid inj, Dr. Paulla Dolly    Past Surgical History:  Procedure Laterality Date  . APPENDECTOMY  1989  . TONSILLECTOMY AND ADENOIDECTOMY  1979      Medications Prior to Admission: Prior to Admission medications   Medication Sig Start Date End Date Taking? Authorizing Provider  diclofenac (VOLTAREN) 75 MG EC tablet Take 1 tablet (75 mg total) by mouth 2 (two) times daily. 01/30/19   Wallene Huh, DPM     Allergies:   No Known Allergies  Social History:   Social History   Socioeconomic History  . Marital status: Married    Spouse name: Not on file  . Number of children: Not on file  . Years of education: Not on file  . Highest education level: Not on file  Occupational History  . Not on file  Tobacco Use  . Smoking status: Never Smoker  . Smokeless tobacco: Never Used  Substance and Sexual Activity  . Alcohol use: Yes  . Drug use: No  . Sexual activity: Not on file  Other Topics Concern  . Not on file  Social History Narrative   Married, two children (one son and one daughter).   Education: [name].   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   No tobacco.  Alc 3-4 beers per week avg.  No alc/drugs.   Exercise: CV and wt's at a GYM 3-5 days a week when feet are not hurting him.   Social Determinants of Health   Financial Resource Strain:   . Difficulty of Paying Living Expenses:   Food Insecurity:   . Worried About Charity fundraiser in the Last Year:   .  Ran Out of Food in the Last Year:   Transportation Needs:   . Film/video editor (Medical):   Marland Kitchen Lack of Transportation (Non-Medical):   Physical Activity:   . Days of Exercise per Week:   . Minutes of Exercise per Session:   Stress:   . Feeling of Stress :   Social Connections:   . Frequency of Communication with Friends and Family:   . Frequency of Social Gatherings with Friends and Family:   . Attends Religious Services:   . Active Member of Clubs or Organizations:   . Attends Archivist Meetings:   Marland Kitchen Marital Status:   Intimate Partner Violence:   . Fear of Current or Ex-Partner:   . Emotionally Abused:    Marland Kitchen Physically Abused:   . Sexually Abused:     Family History:   The patient's family history includes Arthritis in his mother; Hyperlipidemia in his mother; Hypertension in his father and mother.    ROS:  Please see the history of present illness.  All other ROS reviewed and negative.     Physical Exam/Data:   Vitals:   07/30/19 2208 07/30/19 2210 07/30/19 2300 07/31/19 0014  BP:  (!) 148/94  129/80  Pulse:  71  (!) 53  Resp:  16    Temp:  98.7 F (37.1 C)    TempSrc:  Oral    SpO2:  96% 96% 95%  Weight: 133.8 kg     Height: 6\' 1"  (1.854 m)      No intake or output data in the 24 hours ending 07/31/19 0023 Last 3 Weights 07/30/2019 07/30/2019 04/18/2018  Weight (lbs) 294 lb 14.4 oz 305 lb 301 lb  Weight (kg) 133.766 kg 138.347 kg 136.533 kg     Body mass index is 38.91 kg/m.  General:  Well nourished, well developed, in no acute distress HEENT: normal Neck: JVD to ~ 8 cm.  Vascular: No carotid bruits; FA pulses 2+ bilaterally without bruits  Cardiac:  normal S1, S2; RRR; no murmur  Lungs:  clear to auscultation bilaterally, no wheezing, rhonchi or rales  Abd: soft, nontender, no hepatomegaly  Ext: no edema Musculoskeletal:  No deformities, BUE and BLE strength normal and equal Skin: warm and dry  Neuro:  CNs 2-12 intact, no focal abnormalities noted Psych:  Normal affect   EKG:  The ECG that was done SR normal axis, normal intervals. No ischemic changes.   Relevant CV Studies: n/a  Laboratory Data:  High Sensitivity Troponin:   Recent Labs  Lab 07/30/19 1425 07/30/19 1554  TROPONINIHS 2 <2      Chemistry Recent Labs  Lab 07/30/19 1425  NA 137  K 3.8  CL 99  CO2 26  GLUCOSE 148*  BUN 17  CREATININE 1.12  CALCIUM 9.1  GFRNONAA >60  GFRAA >60  ANIONGAP 12    No results for input(s): PROT, ALBUMIN, AST, ALT, ALKPHOS, BILITOT in the last 168 hours. Hematology Recent Labs  Lab 07/30/19 1425  WBC 5.7  RBC 5.38  HGB 16.2  HCT 47.1  MCV 87.5    MCH 30.1  MCHC 34.4  RDW 12.0  PLT 231   BNPNo results for input(s): BNP, PROBNP in the last 168 hours.  DDimer  Recent Labs  Lab 07/30/19 1635  DDIMER <0.27     Radiology/Studies:  DG Chest 2 View  Result Date: 07/30/2019 CLINICAL DATA:  Chest pain. Additional history provided: Elevated blood pressure, tightness in chest, history of mild intermittent  asthma. EXAM: CHEST - 2 VIEW COMPARISON:  No pertinent prior studies available for comparison. FINDINGS: Heart size within normal limits. There is no appreciable airspace consolidation. No evidence of pleural effusion or pneumothorax. No acute bony abnormality identified. IMPRESSION: No evidence of acute cardiopulmonary abnormality. Electronically Signed   By: Kellie Simmering DO   On: 07/30/2019 15:13       HEAR Score (for undifferentiated chest pain):  HEAR Score: 2    Assessment and Plan:   1. Atypical CP Patient presents with intermittent chest tightness x months. Does not appear exertional, positional, pleuritic. Query whether it is related to increased post-nasal drip / cough with MSK element. Could also be related to untreated HTN. Given negative biomarkers and atypical nature, will plan non-invasive ischemic evaluation and risk factor modification.  -- Complete TTE ordered -- Risk stratification with lipid panel, Hba1c -- Add antihypertensive PRN.  -- Exercise nuclear stress in AM.   Severity of Illness: The appropriate patient status for this patient is OBSERVATION. Observation status is judged to be reasonable and necessary in order to provide the required intensity of service to ensure the patient's safety. The patient's presenting symptoms, physical exam findings, and initial radiographic and laboratory data in the context of their medical condition is felt to place them at decreased risk for further clinical deterioration. Furthermore, it is anticipated that the patient will be medically stable for discharge from the hospital  within 2 midnights of admission. The following factors support the patient status of observation.   " The patient's presenting symptoms include chest pain. " The physical exam findings include mn/a " The initial radiographic and laboratory data are reassuring.    For questions or updates, please contact [facility] Please consult [email or web address] for contact info under      Signed, Milus Banister, MD  07/31/2019 12:23 AM

## 2019-07-31 NOTE — Progress Notes (Signed)
   Cody Cruz presented for a nuclear stress test today.  No immediate complications.  Stress imaging is pending at this time.  Preliminary EKG findings may be listed in the chart, but the stress test result will not be finalized until perfusion imaging is complete.  Tami Lin Duke, PA-C 07/31/2019, 9:57 AM

## 2019-07-31 NOTE — Progress Notes (Signed)
Echo attempted at 9:35, patient in nuc med. Will retry later in the day. [place]

## 2019-07-31 NOTE — Progress Notes (Signed)
Subjective:  Denies SSCP, palpitations or Dyspnea Discussed echo and myovue   Objective:  Vitals:   07/30/19 2210 07/30/19 2300 07/31/19 0014 07/31/19 0605  BP: (!) 148/94  129/80 130/81  Pulse: 71  (!) 53 (!) 56  Resp: 16   16  Temp: 98.7 F (37.1 C)   98 F (36.7 C)  TempSrc: Oral   Oral  SpO2: 96% 96% 95% 94%  Weight:    133.7 kg  Height:        Intake/Output from previous day: No intake or output data in the 24 hours ending 07/31/19 0739  Physical Exam: Affect appropriate Overweight white male  HEENT: normal Neck supple with no adenopathy JVP normal no bruits no thyromegaly Lungs clear with no wheezing and good diaphragmatic motion Heart:  S1/S2 no murmur, no rub, gallop or click PMI normal Abdomen: benighn, BS positve, no tenderness, no AAA no bruit.  No HSM or HJR Distal pulses intact with no bruits No edema Neuro non-focal Skin warm and dry No muscular weakness   Lab Results: Basic Metabolic Panel: Recent Labs    07/30/19 1425 07/31/19 0607  NA 137 140  K 3.8 4.0  CL 99 107  CO2 26 24  GLUCOSE 148* 104*  BUN 17 16  CREATININE 1.12 1.22  CALCIUM 9.1 9.1   Liver Function Tests: Recent Labs    07/31/19 0607  AST 32  ALT 54*  ALKPHOS 60  BILITOT 1.2  PROT 6.3*  ALBUMIN 3.7   No results for input(s): LIPASE, AMYLASE in the last 72 hours. CBC: Recent Labs    07/30/19 1425 07/31/19 0607  WBC 5.7 6.3  HGB 16.2 15.4  HCT 47.1 44.6  MCV 87.5 88.1  PLT 231 224   Cardiac Enzymes: No results for input(s): CKTOTAL, CKMB, CKMBINDEX, TROPONINI in the last 72 hours. BNP: Invalid input(s): POCBNP D-Dimer: Recent Labs    07/30/19 1635  DDIMER <0.27   Hemoglobin A1C: No results for input(s): HGBA1C in the last 72 hours. Fasting Lipid Panel: Recent Labs    07/31/19 0607  CHOL 263*  HDL 34*  LDLCALC 174*  TRIG 274*  CHOLHDL 7.7   Thyroid Function Tests: No results for input(s): TSH, T4TOTAL, T3FREE, THYROIDAB in the last 72  hours.  Invalid input(s): FREET3 Anemia Panel: No results for input(s): VITAMINB12, FOLATE, FERRITIN, TIBC, IRON, RETICCTPCT in the last 72 hours.  Imaging: DG Chest 2 View  Result Date: 07/30/2019 CLINICAL DATA:  Chest pain. Additional history provided: Elevated blood pressure, tightness in chest, history of mild intermittent asthma. EXAM: CHEST - 2 VIEW COMPARISON:  No pertinent prior studies available for comparison. FINDINGS: Heart size within normal limits. There is no appreciable airspace consolidation. No evidence of pleural effusion or pneumothorax. No acute bony abnormality identified. IMPRESSION: No evidence of acute cardiopulmonary abnormality. Electronically Signed   By: Kellie Simmering DO   On: 07/30/2019 15:13    Cardiac Studies:  ECG:  SR poor R wave progression nonspecific ST changes   Telemetry:  NSR no arrhythmia  Echo: pending   Medications:   . [START ON 08/01/2019] aspirin EC  81 mg Oral Daily  . heparin  5,000 Units Subcutaneous Q8H  . sodium chloride flush  3 mL Intravenous Once      Assessment/Plan:   1. Chest Pain: atypical r/o abnormal ECG for echo and myovue today d/c home if negative  2. Sinus: post nasal drip claritin and flonase   Jenkins Rouge 07/31/2019, 7:39 AM

## 2019-07-31 NOTE — Discharge Summary (Signed)
Discharge Summary    Patient ID: SAVVA ADINOLFI MRN: [id number]; DOB: June 12, 1975  Admit date: 07/30/2019 Discharge date: 07/31/2019  Primary Care Provider: Tammi Sou, MD  Primary Cardiologist: Jenkins Rouge, MD  Primary Electrophysiologist:  None   Discharge Diagnoses    Principal Problem:   Chest pain Active Problems:   Hyperlipidemia   OSA (obstructive sleep apnea)   Atypical chest pain   Hypertension   Diagnostic Studies/Procedures    Nuclear stress test and echo personally reviewed by Dr. Johnsie Cancel, per his note:  TTE: no RWMAls EF normal no significant valve disease or pericardial effusion normal aortic root Myovue normal non ischemic _____________   History of Present Illness     Cody Cruz is a 44 y.o. male with untreated hypertension and hyperlipidemia, obesity and OSA.  Mr. Lieb has been experiencing intermittent chest "tightness" x 1-2 months. He can't identify any reliable aggravating or alleviating factors, but did feel a bit more tightness [place] airport recently. With the tightness he denies SOB, palpitations, diaphoresis, nausea. Has had increased post-nasal drip and some cough recently, but pain not pleuritic in nature and not reproducible with palpation of the sternum. Earlier today he again noted chest tightness and checked his BP, which was 160s/100s. He tried to make an appointment to see his PCP to refill his antihypertensives (as he had been off of these) but they referred him to the ER.   Upon arrival, patient HTNsive to 156-93, HR 84. EKG without ischemic changes and hsTn negative x 2. Labs otherwise unremarkable. He was transferred to [facility] for further ischemic eval.   On ROS, no orthopnea, PND, LE edema. No fevers, chills. Cough as mentioned.   Does have strong family history of CAD in grandfather and uncles, but no first degree relatives.   Hospital Course     Consultants: none  Chest pain Nuclear stress test was  nonischemic. Echocardiogram with normal EF, no WMA, no significant valvular disease. He denies recurrence of chest pain since admission. Given risk factors, will leave on 81 mg ASA. Stopped PO diclofenac.   Hypertension Patient is concerned that his untreated hypertension started his CP. He had been off home BP medications for some time. BP at home prior to presentation was in the 160s/100s. Pressure has been mildly elevated here. Will start low dose of 25 mg losartan. We discussed proper way to check BP at home. He will keep a log and present this at follow up appt,.We discussed avoidance of salt and fast food/fried food.   Hyperlipidemia 07/31/2019: Cholesterol 263; HDL 34; LDL Cholesterol 174; Triglycerides 274; VLDL 55 Will start 20 mg crestor. He is leery of a statin. Will start at a moderate dose, but will likely need further titration of cholesterol-controlling regimen.     Mildly elevated ALT - will need LFTs at follow up - instructed to stop alcohol until follow up   Obesity OSA - needs to lose weight   Pt seen and examined by Dr. Johnsie Cancel and deemed stable for discharge. Follow up has been arranged.   Did the patient have an acute coronary syndrome (MI, NSTEMI, STEMI, etc) this admission?:  No                               Did the patient have a percutaneous coronary intervention (stent / angioplasty)?:  No.   _____________  Discharge Vitals Blood pressure (!) 139/95, pulse 66,  temperature 98 F (36.7 C), temperature source Oral, resp. rate 16, height 6\' 1"  (1.854 m), weight 133.7 kg, SpO2 94 %.  Filed Weights   07/30/19 1408 07/30/19 2208 07/31/19 0605  Weight: (!) 138.3 kg 133.8 kg 133.7 kg    Labs & Radiologic Studies    CBC Recent Labs    07/30/19 1425 07/31/19 0607  WBC 5.7 6.3  HGB 16.2 15.4  HCT 47.1 44.6  MCV 87.5 88.1  PLT 231 [id number]   Basic Metabolic Panel Recent Labs    07/30/19 1425 07/31/19 0607  NA 137 140  K 3.8 4.0  CL 99 107  CO2 26 24    GLUCOSE 148* 104*  BUN 17 16  CREATININE 1.12 1.22  CALCIUM 9.1 9.1   Liver Function Tests Recent Labs    07/31/19 0607  AST 32  ALT 54*  ALKPHOS 60  BILITOT 1.2  PROT 6.3*  ALBUMIN 3.7   No results for input(s): LIPASE, AMYLASE in the last 72 hours. High Sensitivity Troponin:   Recent Labs  Lab 07/30/19 1425 07/30/19 1554  TROPONINIHS 2 <2    BNP Invalid input(s): POCBNP D-Dimer Recent Labs    07/30/19 1635  DDIMER <0.27   Hemoglobin A1C Recent Labs    07/31/19 0607  HGBA1C 5.5   Fasting Lipid Panel Recent Labs    07/31/19 0607  CHOL 263*  HDL 34*  LDLCALC 174*  TRIG 274*  CHOLHDL 7.7   Thyroid Function Tests No results for input(s): TSH, T4TOTAL, T3FREE, THYROIDAB in the last 72 hours.  Invalid input(s): FREET3 _____________  DG Chest 2 View  Result Date: 07/30/2019 CLINICAL DATA:  Chest pain. Additional history provided: Elevated blood pressure, tightness in chest, history of mild intermittent asthma. EXAM: CHEST - 2 VIEW COMPARISON:  No pertinent prior studies available for comparison. FINDINGS: Heart size within normal limits. There is no appreciable airspace consolidation. No evidence of pleural effusion or pneumothorax. No acute bony abnormality identified. IMPRESSION: No evidence of acute cardiopulmonary abnormality. Electronically Signed   By: Kellie Simmering DO   On: 07/30/2019 15:13   Disposition   Pt is being discharged home today in good condition.  Follow-up Plans & Appointments    Follow-up Information    Josue Hector, MD Follow up on 08/18/2019.   Specialty: Cardiology Why: 8:15 am - will be a video visit through MyChart access Contact information: [address] [place] [address] [phone]          Discharge Instructions    Diet - low sodium heart healthy   Complete by: As directed    Increase activity slowly   Complete by: As directed       Discharge Medications   Allergies as of 07/31/2019    No Known Allergies     Medication List    STOP taking these medications   diclofenac 75 MG EC tablet Commonly known as: VOLTAREN     TAKE these medications   aspirin 81 MG EC tablet Take 1 tablet (81 mg total) by mouth daily. Start taking on: Aug 01, 2019   losartan 25 MG tablet Commonly known as: COZAAR Take 1 tablet (25 mg total) by mouth daily.   rosuvastatin 20 MG tablet Commonly known as: CRESTOR Take 1 tablet (20 mg total) by mouth daily.          Outstanding Labs/Studies   BP log  Duration of Discharge Encounter   Greater than 30 minutes including physician time.  Signed, Tami Lin Aayden Cefalu, PA 07/31/2019, 1:28 PM

## 2019-08-03 ENCOUNTER — Telehealth: Payer: Self-pay

## 2019-08-03 ENCOUNTER — Encounter: Payer: Self-pay | Admitting: Family Medicine

## 2019-08-03 NOTE — Telephone Encounter (Signed)
Transition Care Management Follow-up Telephone Call  Admission: 07/30/2019-07/31/2019 Diagnosis: CP, HTN   How have you been since you were released from the hospital? "I'm okay"  States CP remains to be intermittent.    Do you understand why you were in the hospital? yes   Do you understand the discharge instructions? yes   Where were you discharged to? Home.    Items Reviewed:  Medications reviewed: yes  Allergies reviewed: yes  Dietary changes reviewed: yes  Referrals reviewed: yes, F/U with Cardiology 08/18/19   Functional Questionnaire:   Activities of Daily Living (ADLs):   He states they are independent in the following: ambulation, bathing and hygiene, feeding, continence, grooming, toileting and dressing States they require assistance with the following: None.    Any transportation issues/concerns?: no   Any patient concerns? no   Confirmed importance and date/time of follow-up visits scheduled yes  Provider Appointment booked with PCP 08/14/2019  Confirmed with patient if condition begins to worsen call PCP or go to the ER.  Patient was given the office number and encouraged to call back with question or concerns.  : yes

## 2019-08-03 NOTE — Telephone Encounter (Signed)
Noted: nurse phone contact with patient for TCM. Signed:  Crissie Sickles, MD           08/03/2019

## 2019-08-04 NOTE — Progress Notes (Signed)
CARDIOLOGY CONSULT NOTE       Patient ID: Cody Cruz MRN: [id number] DOB/AGE: 1975/04/17 44 y.o.  Admit date: (Not on file) Referring Physician: [place] Primary Physician: Tammi Sou, MD Primary Cardiologist: Johnsie Cancel Reason for Consultation: Chest Pain TOC   Active Problems:   * No active hospital problems. *   HPI:  44 y.o. seen in hospital 07/31/19 for somewhat atypical chest pain.  R/O no acute ECG changes Lexiscan myovue with no ischemia EF 53% TTE with normal EF no RWMAls and no valve disease CXR NAD CRFls include HTN and HLD D/c on statin and ARB Prior to admission had not been compliant with his HTN meds   Been fine since d/c no chest pain. BP still up Has not seen primary yet. 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 Chest pain 07/2019   Stress test and echo normal  . Essential hypertension   . Hyperlipidemia    statin started at the time of CP admission 07/2019  . Mild intermittent asthma    activity induced, typically.  Allergies trigger as a kid.  . Obesity, Class II, BMI 35-39.9   . OSA on CPAP    [place] [facility]  . Plantar fasciitis 01/2019   steroid inj, Dr. Paulla Dolly    Family History  Problem Relation Age of Onset  . Arthritis Mother   . Hyperlipidemia Mother   . Hypertension Mother   . Hypertension Father     Social History   Socioeconomic History  . Marital status: Married    Spouse name: Not on file  . Number of children: Not on file  . Years of education: Not on file  . Highest education level: Not on file  Occupational History  . Not on file  Tobacco Use  . Smoking status: Never Smoker  . Smokeless tobacco: Never Used  Substance and Sexual Activity  . Alcohol use: Yes  . Drug use: No  . Sexual activity: Not on file  Other Topics Concern  . Not on file  Social  History Narrative   Married, two children (one son and one daughter).   Education: [name].   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   No tobacco.  Alc 3-4 beers per week avg.  No alc/drugs.   Exercise: CV and wt's at a GYM 3-5 days a week when feet are not hurting him.   Social Determinants of Health   Financial Resource Strain:   . Difficulty of Paying Living Expenses:   Food Insecurity:   . Worried About Charity fundraiser in the Last Year:   . Arboriculturist in the Last Year:   Transportation Needs:   . Film/video editor (Medical):   Marland Kitchen Lack of Transportation (Non-Medical):   Physical Activity:   . Days of Exercise per Week:   . Minutes of Exercise per Session:   Stress:   . Feeling of Stress :   Social Connections:   . Frequency of Communication with Friends and Family:   . Frequency of Social Gatherings with Friends and Family:   . Attends Religious Services:   . Active Member of Clubs or Organizations:   . Attends Archivist Meetings:   Marland Kitchen Marital Status:   Intimate Partner Violence:   . Fear of Current  or Ex-Partner:   . Emotionally Abused:   Marland Kitchen Physically Abused:   . Sexually Abused:     Past Surgical History:  Procedure Laterality Date  . APPENDECTOMY  1989  . CARDIOVASCULAR STRESS TEST  07/31/2019   NO ISCHEMIA  . TONSILLECTOMY AND ADENOIDECTOMY  1979  . TRANSTHORACIC ECHOCARDIOGRAM  07/31/2019   normal      Current Outpatient Medications:  .  aspirin EC 81 MG EC tablet, Take 1 tablet (81 mg total) by mouth daily., Disp: 150 tablet, Rfl: 3 .  losartan (COZAAR) 25 MG tablet, Take 1 tablet (25 mg total) by mouth daily., Disp: 90 tablet, Rfl: 3 .  rosuvastatin (CRESTOR) 20 MG tablet, Take 1 tablet (20 mg total) by mouth daily., Disp: 90 tablet, Rfl: 3    Physical Exam: Blood pressure (!) 141/89, pulse (!) 56, height 6\' 1"  (1.854 m), weight 294 lb (133.4 kg).   No distress Overweight white male No JVP  elevation No tachypnea No edema  Labs:   Lab Results  Component Value Date   WBC 6.3 07/31/2019   HGB 15.4 07/31/2019   HCT 44.6 07/31/2019   MCV 88.1 07/31/2019   PLT 224 07/31/2019    No results for input(s): NA, K, CL, CO2, BUN, CREATININE, CALCIUM, PROT, BILITOT, ALKPHOS, ALT, AST, GLUCOSE in the last 168 hours.  Invalid input(s): LABALBU No results found for: CKTOTAL, CKMB, CKMBINDEX, TROPONINI  Lab Results  Component Value Date   CHOL 263 (H) 07/31/2019   CHOL 245 (H) 11/26/2016   CHOL 245 (H) 05/12/2015   Lab Results  Component Value Date   HDL 34 (L) 07/31/2019   HDL 42.30 11/26/2016   HDL 40.50 05/12/2015   Lab Results  Component Value Date   LDLCALC 174 (H) 07/31/2019   LDLCALC 174 (H) 11/26/2016   LDLCALC 173 (H) 05/12/2015   Lab Results  Component Value Date   TRIG 274 (H) 07/31/2019   TRIG 140.0 11/26/2016   TRIG 158.0 (H) 05/12/2015   Lab Results  Component Value Date   CHOLHDL 7.7 07/31/2019   CHOLHDL 6 11/26/2016   CHOLHDL 6 05/12/2015   No results found for: LDLDIRECT    Radiology: DG Chest 2 View  Result Date: 07/30/2019 CLINICAL DATA:  Chest pain. Additional history provided: Elevated blood pressure, tightness in chest, history of mild intermittent asthma. EXAM: CHEST - 2 VIEW COMPARISON:  No pertinent prior studies available for comparison. FINDINGS: Heart size within normal limits. There is no appreciable airspace consolidation. No evidence of pleural effusion or pneumothorax. No acute bony abnormality identified. IMPRESSION: No evidence of acute cardiopulmonary abnormality. Electronically Signed   By: Kellie Simmering DO   On: 07/30/2019 15:13   NM Myocar Multi W/Spect Tamela Oddi Motion / EF  Result Date: 07/31/2019 CLINICAL DATA:  Acute chest pain. EXAM: MYOCARDIAL IMAGING WITH SPECT (REST AND PHARMACOLOGIC-STRESS) GATED LEFT VENTRICULAR WALL MOTION STUDY LEFT VENTRICULAR EJECTION FRACTION TECHNIQUE: Standard myocardial SPECT imaging was  performed after resting intravenous injection of 10.8 mCi Tc-48m tetrofosmin. Subsequently, intravenous infusion of Lexiscan was performed under the supervision of the Cardiology staff. At peak effect of the drug, 31.2 mCi Tc-39m tetrofosmin was injected intravenously and standard myocardial SPECT imaging was performed. Quantitative gated imaging was also performed to evaluate left ventricular wall motion, and estimate left ventricular ejection fraction. COMPARISON:  None. FINDINGS: Perfusion: No decreased activity in the left ventricle on stress imaging to suggest reversible ischemia or infarction. Wall Motion: Minimal hypokinesis involving the apical segment of the  lateral wall and mid anterior lateral wall. Left Ventricular Ejection Fraction: 53 % End diastolic volume [id number] ml End systolic volume 76 ml IMPRESSION: 1. No reversible ischemia or infarction. 2. Normal left ventricular wall motion. 3. Left ventricular ejection fraction 53% 4. Non invasive risk stratification*: Low *2012 Appropriate Use Criteria for Coronary Revascularization Focused Update: J Am Coll Cardiol. [id number]. http://content.airportbarriers.com.aspx?articleid=1201161 Electronically Signed   By: Kerby Moors M.D.   On: 07/31/2019 13:47   ECHOCARDIOGRAM COMPLETE  Result Date: 07/31/2019    ECHOCARDIOGRAM REPORT   Patient Name:   Cody Cruz [facility] Date of Exam: 07/31/2019 Medical Rec #:  [id number]       Height:       73.0 in Accession #:    [id number]      Weight:       294.8 lb Date of Birth:  01-24-1976       BSA:          2.537 m Patient Age:    43 years        BP:           130/87 mmHg Patient Gender: M               HR:           53 bpm. Exam Location:  Inpatient Procedure: 2D Echo, Color Doppler and Cardiac Doppler Indications:    R07.9* Chest pain, unspecified  History:        Patient has no prior history of Echocardiogram examinations.                 Risk Factors:Dyslipidemia and Sleep Apnea.  Sonographer:    Raquel Sarna Senior  RDCS Referring Phys: [place]  1. Left ventricular ejection fraction, by estimation, is 60 to 65%. The left ventricle has normal function. The left ventricle has no regional wall motion abnormalities. Left ventricular diastolic parameters were normal.  2. Right ventricular systolic function is normal. The right ventricular size is normal. Tricuspid regurgitation signal is inadequate for assessing PA pressure.  3. The mitral valve is normal in structure. No evidence of mitral valve regurgitation. No evidence of mitral stenosis.  4. The aortic valve is normal in structure. Aortic valve regurgitation is not visualized. No aortic stenosis is present.  5. The inferior vena cava is normal in size with <50% respiratory variability, suggesting right atrial pressure of 8 mmHg. FINDINGS  Left Ventricle: Left ventricular ejection fraction, by estimation, is 60 to 65%. The left ventricle has normal function. The left ventricle has no regional wall motion abnormalities. The left ventricular internal cavity size was normal in size. There is  no left ventricular hypertrophy. Left ventricular diastolic parameters were normal. Normal left ventricular filling pressure. Right Ventricle: The right ventricular size is normal. No increase in right ventricular wall thickness. Right ventricular systolic function is normal. Tricuspid regurgitation signal is inadequate for assessing PA pressure. Left Atrium: Left atrial size was normal in size. Right Atrium: Right atrial size was normal in size. Pericardium: There is no evidence of pericardial effusion. Mitral Valve: The mitral valve is normal in structure. Normal mobility of the mitral valve leaflets. No evidence of mitral valve regurgitation. No evidence of mitral valve stenosis. Tricuspid Valve: The tricuspid valve is normal in structure. Tricuspid valve regurgitation is not demonstrated. No evidence of tricuspid stenosis. Aortic Valve: The aortic valve is normal in  structure. Aortic valve regurgitation is not visualized. No aortic stenosis is present. Pulmonic Valve: The pulmonic  valve was normal in structure. Pulmonic valve regurgitation is not visualized. No evidence of pulmonic stenosis. Aorta: The aortic root is normal in size and structure. Venous: The inferior vena cava is normal in size with less than 50% respiratory variability, suggesting right atrial pressure of 8 mmHg. IAS/Shunts: No atrial level shunt detected by color flow Doppler.  LEFT VENTRICLE PLAX 2D LVIDd:         5.60 cm      Diastology LVIDs:         3.90 cm      LV e' lateral:   12.60 cm/s LV PW:         1.20 cm      LV E/e' lateral: 5.6 LV IVS:        0.90 cm      LV e' medial:    8.05 cm/s LVOT diam:     2.50 cm      LV E/e' medial:  8.8 LV SV:         93 LV SV Index:   37 LVOT Area:     4.91 cm  LV Volumes (MOD) LV vol d, MOD A2C: 121.0 ml LV vol d, MOD A4C: 130.0 ml LV vol s, MOD A2C: 60.7 ml LV vol s, MOD A4C: 66.5 ml LV SV MOD A2C:     60.3 ml LV SV MOD A4C:     130.0 ml LV SV MOD BP:      62.8 ml RIGHT VENTRICLE RV S prime:     12.70 cm/s TAPSE (M-mode): 2.2 cm LEFT ATRIUM             Index       RIGHT ATRIUM           Index LA diam:        4.10 cm 1.62 cm/m  RA Area:     13.90 cm LA Vol (A2C):   54.2 ml 21.36 ml/m RA Volume:   27.80 ml  10.96 ml/m LA Vol (A4C):   55.6 ml 21.91 ml/m LA Biplane Vol: 56.5 ml 22.27 ml/m  AORTIC VALVE LVOT Vmax:   88.80 cm/s LVOT Vmean:  66.500 cm/s LVOT VTI:    0.189 m  AORTA Ao Root diam: 3.50 cm Ao Asc diam:  3.20 cm MITRAL VALVE MV Area (PHT): 3.72 cm    SHUNTS MV Decel Time: 204 msec    Systemic VTI:  0.19 m MV E velocity: 71.10 cm/s  Systemic Diam: 2.50 cm MV A velocity: 66.00 cm/s MV E/A ratio:  1.08 Fransico Him MD Electronically signed by Fransico Him MD Signature Date/Time: 07/31/2019/2:21:47 PM    Final     EKG: NSR normal eCG 07/31/19   ASSESSMENT AND PLAN:   1. Chest pain atypical r/o normal ECG, Echo and myovue 07/31/19.  Discussed utility of  calcium score to further risk stratify and also guide Rx of LDL  2. HLD :  Continue statin see above labs in 8 weeks   3. HTN: discussed diet, weight loss and exercise continue ARB increase dose to 50 mg daily   F/U with me in 3 months   Time spent reviewing chart, echo myovue direct patient interview and composing note 25 minutes   Signed: Jenkins Rouge 08/18/2019, 7:59 AM

## 2019-08-12 ENCOUNTER — Telehealth: Payer: Self-pay

## 2019-08-12 NOTE — Telephone Encounter (Signed)
Left message for patient to call back  

## 2019-08-13 ENCOUNTER — Telehealth: Payer: Self-pay | Admitting: Cardiovascular Disease

## 2019-08-13 NOTE — Telephone Encounter (Signed)
Transferred call to Pam I. He was returning her call from 5/26

## 2019-08-13 NOTE — Telephone Encounter (Signed)
Patient called back. Informed him that I would be calling him on Tuesday 15 minutes prior to his appointment to get VS and go over medications and allergies. Will send information about visit to patient's Mychart to get consent.

## 2019-08-14 ENCOUNTER — Inpatient Hospital Stay: Payer: 59 | Admitting: Family Medicine

## 2019-08-18 ENCOUNTER — Telehealth (INDEPENDENT_AMBULATORY_CARE_PROVIDER_SITE_OTHER): Payer: 59 | Admitting: Cardiovascular Disease

## 2019-08-18 ENCOUNTER — Other Ambulatory Visit: Payer: Self-pay

## 2019-08-18 VITALS — BP 141/89 | HR 56 | Ht 73.0 in | Wt 294.0 lb

## 2019-08-18 DIAGNOSIS — I10 Essential (primary) hypertension: Secondary | ICD-10-CM | POA: Diagnosis not present

## 2019-08-18 DIAGNOSIS — R079 Chest pain, unspecified: Secondary | ICD-10-CM | POA: Diagnosis not present

## 2019-08-18 DIAGNOSIS — E782 Mixed hyperlipidemia: Secondary | ICD-10-CM | POA: Diagnosis not present

## 2019-08-18 DIAGNOSIS — K229 Disease of esophagus, unspecified: Secondary | ICD-10-CM

## 2019-08-18 DIAGNOSIS — R918 Other nonspecific abnormal finding of lung field: Secondary | ICD-10-CM

## 2019-08-18 HISTORY — DX: Other nonspecific abnormal finding of lung field: R91.8

## 2019-08-18 HISTORY — PX: OTHER SURGICAL HISTORY: SHX169

## 2019-08-18 HISTORY — DX: Disease of esophagus, unspecified: K22.9

## 2019-08-18 MED ORDER — LOSARTAN POTASSIUM 50 MG PO TABS: 50.0000 mg | ORAL_TABLET | Freq: Every day | ORAL | 3 refills | Status: DC

## 2019-08-18 NOTE — Patient Instructions (Addendum)
Medication Instructions:  Your physician has recommended you make the following change in your medication:  1-Increase losartan 50 mg by mouth daily  *If you need a refill on your cardiac medications before your next appointment, please call your pharmacy*  Lab Work: Your physician recommends that you return for lab work in: 8 weeks for CMET and fasting lipid panel. Please be fasting for this appointment.  If you have labs (blood work) drawn today and your tests are completely normal, you will receive your results only by: Marland Kitchen MyChart Message (if you have MyChart) OR . A paper copy in the mail If you have any lab test that is abnormal or we need to change your treatment, we will call you to review the results.  Testing/Procedures: Cardiac CT scanning for calcium score, (CAT scanning), is a noninvasive, special x-ray that produces cross-sectional images of the body using x-rays and a computer. CT scans help physicians diagnose and treat medical conditions. For some CT exams, a contrast material is used to enhance visibility in the area of the body being studied. CT scans provide greater clarity and reveal more details than regular x-ray exams.    Follow-Up: At [facility], you and your health needs are our priority.  As part of our continuing mission to provide you with exceptional heart care, we have created designated Provider Care Teams.  These Care Teams include your primary Cardiologist (physician) and Advanced Practice Providers (APPs -  Physician Assistants and Nurse Practitioners) who all work together to provide you with the care you need, when you need it.  We recommend signing up for the patient portal called "MyChart".  Sign up information is provided on this After Visit Summary.  MyChart is used to connect with patients for Virtual Visits (Telemedicine).  Patients are able to view lab/test results, encounter notes, upcoming appointments, etc.  Non-urgent messages can be sent to  your provider as well.   To learn more about what you can do with MyChart, go to [email or web address].    Your next appointment:   3 month(s)  The format for your next appointment:   In Person  Provider:   You may see Jenkins Rouge, MD or one of the following Advanced Practice Providers on your designated Care Team:    Truitt Merle, NP  Cecilie Kicks, NP  Kathyrn Drown, NP

## 2019-08-25 ENCOUNTER — Encounter: Payer: Self-pay | Admitting: Family Medicine

## 2019-08-25 ENCOUNTER — Ambulatory Visit (INDEPENDENT_AMBULATORY_CARE_PROVIDER_SITE_OTHER)
Admission: RE | Admit: 2019-08-25 | Discharge: 2019-08-25 | Disposition: A | Discharge status: Home / Self Care | Payer: Self-pay | Source: Ambulatory Visit | Attending: Cardiovascular Disease | Admitting: Cardiovascular Disease

## 2019-08-25 ENCOUNTER — Telehealth: Payer: Self-pay

## 2019-08-25 ENCOUNTER — Telehealth: Payer: Self-pay | Admitting: Family Medicine

## 2019-08-25 ENCOUNTER — Other Ambulatory Visit: Payer: Self-pay

## 2019-08-25 DIAGNOSIS — I10 Essential (primary) hypertension: Secondary | ICD-10-CM

## 2019-08-25 DIAGNOSIS — R918 Other nonspecific abnormal finding of lung field: Secondary | ICD-10-CM

## 2019-08-25 DIAGNOSIS — R079 Chest pain, unspecified: Secondary | ICD-10-CM

## 2019-08-25 DIAGNOSIS — E782 Mixed hyperlipidemia: Secondary | ICD-10-CM

## 2019-08-25 NOTE — Telephone Encounter (Signed)
LM for pt to returncall

## 2019-08-25 NOTE — Telephone Encounter (Signed)
Pt's coronary calcium CT imaging incidentally showed some esophageal abnormalities that suggest possible inadequate esophageal motility.  I recommend he see a gastroenterologist. If pt agreeable, pls order referral to [facility], dx esoph dysmotility.-thx

## 2019-08-25 NOTE — Telephone Encounter (Signed)
-----   Message from Josue Hector, MD sent at 08/25/2019 10:27 AM EDT ----- Calcium score 0 great would still stay on statin with LDL 174  F/u non contrast chest CT for lung nodules in a year consider GI Evaluation for esophageal dysmotility per primary

## 2019-08-25 NOTE — Telephone Encounter (Signed)
Patient aware of results. Per Dr. Johnsie Cancel, calcium score 0, great would still stay on statin with LDL 174.  Follow up with non contrast chest CT for lung nodules in a year. Consider GI evaluation for esophageal dysmotility per primary. Will place order for chest CT in one year. Patient verbalized understanding.

## 2019-08-27 NOTE — Telephone Encounter (Signed)
Patient was advised of results but declines referral for now.

## 2019-09-01 ENCOUNTER — Inpatient Hospital Stay: Payer: 59 | Admitting: Family Medicine

## 2019-09-02 ENCOUNTER — Other Ambulatory Visit: Payer: 59

## 2019-09-04 ENCOUNTER — Encounter: Payer: Self-pay | Admitting: Family Medicine

## 2019-09-04 ENCOUNTER — Other Ambulatory Visit: Payer: 59 | Admitting: *Deleted

## 2019-09-04 ENCOUNTER — Ambulatory Visit (INDEPENDENT_AMBULATORY_CARE_PROVIDER_SITE_OTHER): Payer: 59 | Admitting: Family Medicine

## 2019-09-04 ENCOUNTER — Other Ambulatory Visit: Payer: Self-pay

## 2019-09-04 VITALS — BP 117/72 | HR 67 | Temp 97.8°F | Resp 16 | Ht 73.0 in | Wt 301.0 lb

## 2019-09-04 DIAGNOSIS — I10 Essential (primary) hypertension: Secondary | ICD-10-CM

## 2019-09-04 DIAGNOSIS — E7800 Pure hypercholesterolemia, unspecified: Secondary | ICD-10-CM

## 2019-09-04 DIAGNOSIS — E782 Mixed hyperlipidemia: Secondary | ICD-10-CM

## 2019-09-04 DIAGNOSIS — R079 Chest pain, unspecified: Secondary | ICD-10-CM

## 2019-09-04 LAB — COMPREHENSIVE METABOLIC PANEL
ALT: 44 IU/L (ref 0–44)
AST: 30 IU/L (ref 0–40)
Albumin/Globulin Ratio: 1.9 (ref 1.2–2.2)
Albumin: 4.7 g/dL (ref 4.0–5.0)
Alkaline Phosphatase: 92 IU/L (ref 48–121)
BUN/Creatinine Ratio: 14 (ref 9–20)
BUN: 15 mg/dL (ref 6–24)
Bilirubin Total: 0.4 mg/dL (ref 0.0–1.2)
CO2: 24 mmol/L (ref 20–29)
Calcium: 9.4 mg/dL (ref 8.7–10.2)
Chloride: 101 mmol/L (ref 96–106)
Creatinine, Ser: 1.1 mg/dL (ref 0.76–1.27)
GFR calc Af Amer: 93 mL/min/{1.73_m2} (ref >59)
GFR calc non Af Amer: 80 mL/min/{1.73_m2} (ref >59)
Globulin, Total: 2.5 g/dL (ref 1.5–4.5)
Glucose: 103 mg/dL — ABNORMAL HIGH (ref 65–99)
Potassium: 4.5 mmol/L (ref 3.5–5.2)
Sodium: 141 mmol/L (ref 134–144)
Total Protein: 7.2 g/dL (ref 6.0–8.5)

## 2019-09-04 LAB — LIPID PANEL
Chol/HDL Ratio: 3.8 ratio (ref 0.0–5.0)
Cholesterol, Total: 169 mg/dL (ref 100–199)
HDL: 45 mg/dL (ref >39)
LDL Chol Calc (NIH): 104 mg/dL — ABNORMAL HIGH (ref 0–99)
Triglycerides: 112 mg/dL (ref 0–149)
VLDL Cholesterol Cal: 20 mg/dL (ref 5–40)

## 2019-09-04 NOTE — Progress Notes (Signed)
09/04/2019  CC:  Chief Complaint  Patient presents with  . Hospitalization Follow-up    Patient is a 44 y.o. Caucasian male who presents for hospital follow up. Dates hospitalized: 5/13-5/14, 2021. Days since d/c from hospital: 35 days Patient was discharged from hospital to home. Reason for admission to hospital: chest pain  Of note, I last saw Cody Cruz for his annual health maintenance exam back in 2018 (44 yrs old).  I have reviewed patient's discharge summary plus pertinent specific notes, labs, and imaging from the hospitalization.   His bp had been out of control, out of meds.  In ED EKG was w/out ischemic changes, Tn neg x 2.  Myocard perf imaging showed no ischemia.  CXR normal.  D dimer neg. TTE normal.  He was d/c'd home on losartan 25mg  qd and ASA 81mg  qd.  Crestor was started as well (LDL 174).  Feeling okay, just has some occasional brief periods of feeling a little tired. BPs 135-140/85-90 or so at cardiology f/u 6/1. Dr. Johnsie Cancel increased his Losartan to 50mg  qd at that time. First bp check since then was this morning and was 140/80. No HR data observed by pt. Denies side effects from meds. NO CP, SOB, nausea, diaphoresis, palpitations, orthopnea, PND, or signif LE swelling. Trying to get back into exercising/working out gradually. No signif changes in diet.  Extra stress at work, was drinking a bit more than his normal, 2-4 beers per day.  Now cutting back.  He does not smoke. He got a lab draw today at cardiologist's to recheck CMET and FLP.  ROS: no fevers, no CP, no SOB, no wheezing, no cough, no dizziness, no HAs, no rashes, no melena/hematochezia.  No polyuria or polydipsia.  No myalgias or arthralgias.  No focal weakness, paresthesias, or tremors.  No acute vision or hearing abnormalities. No n/v/d or abd pain.  No palpitations.     Medication reconciliation was done today and patient is taking meds as recommended by discharging hospitalist/specialist.     PMH:  Past Medical History:  Diagnosis Date  . Chest pain 07/2019   Stress test and echo normal  . Esophageal abnormality 08/2019   mildly dilated distal esophagus + food debris in esoph noted on coronary CT calcium scoring imaging--refer to GI for eval for possible esoph dysmotility  . Essential hypertension   . Hyperlipidemia    statin started at the time of CP admission 07/2019  . Mild intermittent asthma    activity induced, typically.  Allergies trigger as a kid.  . Obesity, Class II, BMI 35-39.9   . OSA on CPAP    [place] [facility]  . Plantar fasciitis 01/2019   steroid inj, Dr. Paulla Dolly  . Pulmonary nodules 08/2019   incidentally noted on coronary calcium scoring imaging-> cards to arrange 1 yr f/u CT    PSH:  Past Surgical History:  Procedure Laterality Date  . APPENDECTOMY  1989  . CARDIOVASCULAR STRESS TEST  07/31/2019   NO ISCHEMIA  . coronary ct  08/2019   calcium score ZERO.    . TONSILLECTOMY AND ADENOIDECTOMY  1979  . TRANSTHORACIC ECHOCARDIOGRAM  07/31/2019   normal    MEDS:  Outpatient Medications Prior to Visit  Medication Sig Dispense Refill  . aspirin EC 81 MG EC tablet Take 1 tablet (81 mg total) by mouth daily. 150 tablet 3  . losartan (COZAAR) 50 MG tablet Take 1 tablet (50 mg total) by mouth daily. 90 tablet 3  . rosuvastatin (CRESTOR)  20 MG tablet Take 1 tablet (20 mg total) by mouth daily. 90 tablet 3   No facility-administered medications prior to visit.   EXAM:  Vitals with BMI 09/04/2019 08/18/2019 07/31/2019  Height 6\' 1"  6\' 1"  -  Weight 301 lbs 294 lbs -  BMI 42.68 34.1 -  Systolic 962 229 798  Diastolic 72 89 90  Pulse 67 56 69  O2 sat 97% on RA today. Gen: Alert, well appearing.  Patient is oriented to person, place, time, and situation. AFFECT: pleasant, lucid thought and speech. XQJ:JHER: no injection, icteris, swelling, or exudate.  EOMI, PERRLA. Mouth: lips without lesion/swelling.  Oral mucosa pink and moist. Oropharynx without  erythema, exudate, or swelling.  Neck: supple/nontender.  No LAD, mass, or TM.  Carotid pulses 2+ bilaterally, without bruits. CV: RRR, no m/r/g.   LUNGS: CTA bilat, nonlabored resps, good aeration in all lung fields. ABD: soft, NT/ND EXT: no clubbing or cyanosis.  1+ bilat LL pitting edema.   Pertinent labs/imaging Lab Results  Component Value Date   TSH 1.55 11/26/2016   Lab Results  Component Value Date   WBC 6.3 07/31/2019   HGB 15.4 07/31/2019   HCT 44.6 07/31/2019   MCV 88.1 07/31/2019   PLT 224 07/31/2019   Lab Results  Component Value Date   CREATININE 1.22 07/31/2019   BUN 16 07/31/2019   NA 140 07/31/2019   K 4.0 07/31/2019   CL 107 07/31/2019   CO2 24 07/31/2019   Lab Results  Component Value Date   ALT 54 (H) 07/31/2019   AST 32 07/31/2019   ALKPHOS 60 07/31/2019   BILITOT 1.2 07/31/2019   Lab Results  Component Value Date   CHOL 263 (H) 07/31/2019   Lab Results  Component Value Date   HDL 34 (L) 07/31/2019   Lab Results  Component Value Date   LDLCALC 174 (H) 07/31/2019   Lab Results  Component Value Date   TRIG 274 (H) 07/31/2019   Lab Results  Component Value Date   CHOLHDL 7.7 07/31/2019   Lab Results  Component Value Date   HGBA1C 5.5 07/31/2019   ASSESSMENT/PLAN:  1) Chest pain: ACS ruled out, all cardiopulm testing unrevealing/reassuring except HTN was out of control. PT tolerating losartan, rosuva, and ASA.  Getting back into exercise. Working on lower Na diet and cutting back on alcohol intake.  Work stress continues, unfortunately. BP in office today is great.  Only one home measurement since his losartan was increased about 2 wks ago (pt has been out of town)---and it was 140/80. No changes today but would like more home bp data.  Instructions: Check your blood pressure and heart rate once a day. Write all numbers down and bring with you to next appt to review with me. Goal blood pressure for you is <130 on top and <80 on  bottom. Call or return before your 2 week f/u if you get >160 on top number on 2 consecutive days or >100 on bottom number for 2 consecutive days.  Spent 33 min with pt today, with >50% of this time spent in counseling and care coordination regarding the above problems.  FOLLOW UP:  2 wks f/u HTN  Signed:  Crissie Sickles, MD           09/04/2019

## 2019-09-04 NOTE — Patient Instructions (Signed)
Check your blood pressure and heart rate once a day. Write all numbers down and bring with you to next appt to review with me. Goal blood pressure for you is <130 on top and <80 on bottom.  Call or return before your 2 week f/u if you get >160 on top number on 2 consecutive days or >100 on bottom number for 2 consecutive days.

## 2019-09-22 ENCOUNTER — Other Ambulatory Visit: Payer: Self-pay

## 2019-09-22 ENCOUNTER — Ambulatory Visit (INDEPENDENT_AMBULATORY_CARE_PROVIDER_SITE_OTHER): Payer: 59 | Admitting: Family Medicine

## 2019-09-22 ENCOUNTER — Encounter: Payer: Self-pay | Admitting: Family Medicine

## 2019-09-22 VITALS — BP 121/73 | HR 73 | Temp 97.7°F | Resp 16 | Ht 73.0 in | Wt 301.0 lb

## 2019-09-22 DIAGNOSIS — T50905A Adverse effect of unspecified drugs, medicaments and biological substances, initial encounter: Secondary | ICD-10-CM

## 2019-09-22 DIAGNOSIS — I10 Essential (primary) hypertension: Secondary | ICD-10-CM | POA: Diagnosis not present

## 2019-09-22 DIAGNOSIS — M1029 Drug-induced gout, multiple sites: Secondary | ICD-10-CM | POA: Diagnosis not present

## 2019-09-22 LAB — URIC ACID: Uric Acid, Serum: 7.3 mg/dL (ref 4.0–7.8)

## 2019-09-22 NOTE — Progress Notes (Signed)
OFFICE VISIT  09/22/2019   CC:  Chief Complaint  Patient presents with  . Follow-up    hypertension, pt is not fasting   HPI:    Patient is a 44 y.o. Caucasian male who presents for 2 wk f/u HTN. No changes made last visit-->wanted/needed more home bp/hr data to sue in determining control.  Home bp's consistently <130/80.    Since starting rosuvastatin approx 8 wks ago, and losartan a month or so prior to that.  About 5 wks ago, severe pain in L MTP--like gout, lasted 3d, still mildly tender now. Last 1 wk same feeling in toes 1, 2 and 3 on R foot at MTPs--milder pain, no swelling. L MTP episode with mild swelling+ heat. No signif dietary changes prior. Eating LESS red meat, drinking less beer.   NO PRIOR HX OF ANY SIMILAR JOINT ISSUES (NO GOUT).  ROS: no fevers, no CP, no SOB, no wheezing, no cough, no dizziness, no HAs, no rashes, no melena/hematochezia.  No polyuria or polydipsia.  No myalgias.  No focal weakness, paresthesias, or tremors.  No acute vision or hearing abnormalities. No n/v/d or abd pain.  No palpitations.     Past Medical History:  Diagnosis Date  . Chest pain 07/2019   ACS ruled out. Stress test and echo normal.  BP was out of control at the time of his eval/hosp.  . Esophageal abnormality 08/2019   mildly dilated distal esophagus + food debris in esoph noted on coronary CT calcium scoring imaging--refer to GI for eval for possible esoph dysmotility--pt declined this as of 08/2019.  Marland Kitchen Essential hypertension   . Hyperlipidemia    statin started at the time of CP admission 07/2019-rosuva  . Mild intermittent asthma    activity induced, typically.  Allergies trigger as a kid.  . Obesity, Class II, BMI 35-39.9   . OSA on CPAP    [place] [facility]  . Plantar fasciitis 01/2019   steroid inj, Dr. Paulla Dolly  . Pulmonary nodules 08/2019   incidentally noted on coronary calcium scoring imaging-> cards to arrange 1 yr f/u CT    Past Surgical History:  Procedure  Laterality Date  . APPENDECTOMY  1989  . CARDIOVASCULAR STRESS TEST  07/31/2019   NO ISCHEMIA  . coronary ct  08/2019   calcium score ZERO.    . TONSILLECTOMY AND ADENOIDECTOMY  1979  . TRANSTHORACIC ECHOCARDIOGRAM  07/31/2019   normal    Outpatient Medications Prior to Visit  Medication Sig Dispense Refill  . aspirin EC 81 MG EC tablet Take 1 tablet (81 mg total) by mouth daily. 150 tablet 3  . losartan (COZAAR) 50 MG tablet Take 1 tablet (50 mg total) by mouth daily. 90 tablet 3  . rosuvastatin (CRESTOR) 20 MG tablet Take 1 tablet (20 mg total) by mouth daily. 90 tablet 3   No facility-administered medications prior to visit.    No Known Allergies  ROS As per HPI  PE: Vitals with BMI 09/22/2019 09/04/2019 08/18/2019  Height 6\' 1"  6\' 1"  6\' 1"   Weight 301 lbs 301 lbs 294 lbs  BMI 39.72 91.47 82.9  Systolic 562 130 865  Diastolic 73 72 89  Pulse 73 67 56  O2 sat on RA today is 97%  Gen: Alert, well appearing.  Patient is oriented to person, place, time, and situation. AFFECT: pleasant, lucid thought and speech. CV: RRR, no m/r/g.   LUNGS: CTA bilat, nonlabored resps, good aeration in all lung fields. Feet: Mild TTP of  L MTP joint.  No warmth or swelling or erythema. Mild TTP of MTP joints of toes 1, 2, and 3 of R foot.  NO swelling, erythema, or warmth. ROM of toes intact.  LABS:    Chemistry      Component Value Date/Time   NA 141 09/04/2019 0734   K 4.5 09/04/2019 0734   CL 101 09/04/2019 0734   CO2 24 09/04/2019 0734   BUN 15 09/04/2019 0734   CREATININE 1.10 09/04/2019 0734      Component Value Date/Time   CALCIUM 9.4 09/04/2019 0734   ALKPHOS 92 09/04/2019 0734   AST 30 09/04/2019 0734   ALT 44 09/04/2019 0734   BILITOT 0.4 09/04/2019 0734     No results found for: LABURIC  IMPRESSION AND PLAN:  1) Acute arthritis, suspect gout, suspect secondary to med-->? Rosuva, ? Losartan. Trial of rosuvastatin holiday x 3 wks, check uric acid level  today. Continue to use NSAID prn when pain increases.  2) HTN: The current medical regimen is effective;  continue present plan and medications. Continue losartan, but we may have to consider change if trial off rosuva does not help suspected gouty arthritis.  An After Visit Summary was printed and given to the patient.  FOLLOW UP: Return in about 3 weeks (around 10/13/2019) for f/u gout.  Signed:  Crissie Sickles, MD           09/22/2019

## 2019-09-22 NOTE — Patient Instructions (Signed)
Stop rosuvastatin for 3 wks (until I see you back for f/u visit).

## 2019-11-19 ENCOUNTER — Ambulatory Visit: Payer: 59 | Admitting: Cardiovascular Disease

## 2019-11-20 NOTE — Progress Notes (Signed)
CARDIOLOGY CONSULT NOTE       Patient ID: SAHEJ SCHRIEBER MRN: [id number] DOB/AGE: 1975-08-31 44 y.o.  Admit date: (Not on file) Referring Physician: McGowen Primary Physician: Tammi Sou, MD Primary Cardiologist: Johnsie Cancel Reason for Consultation: Chest Pain TOC   Active Problems:   * No active hospital problems. *   HPI:  44 y.o. seen in hospital 07/31/19 for somewhat atypical chest pain.  R/O no acute ECG changes Lexiscan myovue with no ischemia EF 53% TTE with normal EF no RWMAls and no valve disease CXR NAD CRFls include HTN and HLD D/c on statin and ARB Prior to admission had not been compliant with his HTN meds   Been fine since d/c no chest pain. BP still up Has not seen primary yet. 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 Daughters in school at [employer] and [employer] to golf. Has had vaccine   ROS All other systems reviewed and negative except as noted above  Past Medical History:  Diagnosis Date  . Chest pain 07/2019   ACS ruled out. Stress test and echo normal.  BP was out of control at the time of his eval/hosp.  . Esophageal abnormality 08/2019   mildly dilated distal esophagus + food debris in esoph noted on coronary CT calcium scoring imaging--refer to GI for eval for possible esoph dysmotility--pt declined this as of 08/2019.  Marland Kitchen Essential hypertension   . Hyperlipidemia    statin started at the time of CP admission 07/2019-rosuva  . Mild intermittent asthma    activity induced, typically.  Allergies trigger as a kid.  . Obesity, Class II, BMI 35-39.9   . OSA on CPAP    [place] [facility]  . Plantar fasciitis 01/2019   steroid inj, Dr. Paulla Dolly  . Pulmonary nodules 08/2019   incidentally  noted on coronary calcium scoring imaging-> cards to arrange 1 yr f/u CT    Family History  Problem Relation Age of Onset  . Arthritis Mother   . Hyperlipidemia Mother   . Hypertension Mother   . Hypertension Father     Social History   Socioeconomic History  . Marital status: Married    Spouse name: Not on file  . Number of children: Not on file  . Years of education: Not on file  . Highest education level: Not on file  Occupational History  . Not on file  Tobacco Use  . Smoking status: Never Smoker  . Smokeless tobacco: Never Used  Substance and Sexual Activity  . Alcohol use: Yes  . Drug use: No  . Sexual activity: Not on file  Other Topics Concern  . Not on file  Social History Narrative   Married, two children (one son and one daughter).   Education: [name].   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   No tobacco.  Alc 3-4 beers per week avg.  No alc/drugs.   Exercise: CV and wt's at a GYM 3-5 days a week when feet are not hurting him.   Social Determinants of Health   Financial Resource Strain:   . Difficulty of Paying Living Expenses: Not on file  Food Insecurity:   .  Worried About Charity fundraiser in the Last Year: Not on file  . Ran Out of Food in the Last Year: Not on file  Transportation Needs:   . Lack of Transportation (Medical): Not on file  . Lack of Transportation (Non-Medical): Not on file  Physical Activity:   . Days of Exercise per Week: Not on file  . Minutes of Exercise per Session: Not on file  Stress:   . Feeling of Stress : Not on file  Social Connections:   . Frequency of Communication with Friends and Family: Not on file  . Frequency of Social Gatherings with Friends and Family: Not on file  . Attends Religious Services: Not on file  . Active Member of Clubs or Organizations: Not on file  . Attends Archivist Meetings: Not on file  . Marital Status: Not on file  Intimate Partner Violence:   .  Fear of Current or Ex-Partner: Not on file  . Emotionally Abused: Not on file  . Physically Abused: Not on file  . Sexually Abused: Not on file    Past Surgical History:  Procedure Laterality Date  . APPENDECTOMY  1989  . CARDIOVASCULAR STRESS TEST  07/31/2019   NO ISCHEMIA  . coronary ct  08/2019   calcium score ZERO.    . TONSILLECTOMY AND ADENOIDECTOMY  1979  . TRANSTHORACIC ECHOCARDIOGRAM  07/31/2019   normal      Current Outpatient Medications:  .  aspirin EC 81 MG EC tablet, Take 1 tablet (81 mg total) by mouth daily., Disp: 150 tablet, Rfl: 3 .  losartan (COZAAR) 50 MG tablet, Take 1 tablet (50 mg total) by mouth daily., Disp: 90 tablet, Rfl: 3 .  rosuvastatin (CRESTOR) 20 MG tablet, Take 1 tablet (20 mg total) by mouth daily., Disp: 90 tablet, Rfl: 3    Physical Exam: There were no vitals taken for this visit.   No distress Overweight white male No JVP elevation No tachypnea No edema  Labs:   Lab Results  Component Value Date   WBC 6.3 07/31/2019   HGB 15.4 07/31/2019   HCT 44.6 07/31/2019   MCV 88.1 07/31/2019   PLT 224 07/31/2019    No results for input(s): NA, K, CL, CO2, BUN, CREATININE, CALCIUM, PROT, BILITOT, ALKPHOS, ALT, AST, GLUCOSE in the last 168 hours.  Invalid input(s): LABALBU No results found for: CKTOTAL, CKMB, CKMBINDEX, TROPONINI  Lab Results  Component Value Date   CHOL 169 09/04/2019   CHOL 263 (H) 07/31/2019   CHOL 245 (H) 11/26/2016   Lab Results  Component Value Date   HDL 45 09/04/2019   HDL 34 (L) 07/31/2019   HDL 42.30 11/26/2016   Lab Results  Component Value Date   LDLCALC 104 (H) 09/04/2019   LDLCALC 174 (H) 07/31/2019   LDLCALC 174 (H) 11/26/2016   Lab Results  Component Value Date   TRIG 112 09/04/2019   TRIG 274 (H) 07/31/2019   TRIG 140.0 11/26/2016   Lab Results  Component Value Date   CHOLHDL 3.8 09/04/2019   CHOLHDL 7.7 07/31/2019   CHOLHDL 6 11/26/2016   No results found for: LDLDIRECT      Radiology: No results found.  EKG: NSR normal eCG 07/31/19   ASSESSMENT AND PLAN:   1. Chest pain atypical r/o normal ECG, Echo and myovue 07/31/19.  Also reassuring that calcium score was 0   2. HLD :  Continue statin  LDL improved to 104   3. HTN: discussed  diet, weight loss and exercise continue ARB increase dose to 50 mg daily   4. Gout:  Avoid dehydration discuss steroids, colchicine and xray with primary Doubt this is from statin   F/U with me in a year   Time spent reviewing chart, echo myovue direct patient interview and composing note 25 minutes   Signed: Jenkins Rouge 11/20/2019, 3:19 PM

## 2019-11-26 ENCOUNTER — Telehealth (INDEPENDENT_AMBULATORY_CARE_PROVIDER_SITE_OTHER): Payer: 59 | Admitting: Cardiovascular Disease

## 2019-11-26 ENCOUNTER — Encounter: Payer: Self-pay | Admitting: Family Medicine

## 2019-11-26 ENCOUNTER — Encounter: Payer: Self-pay | Admitting: Cardiovascular Disease

## 2019-11-26 ENCOUNTER — Other Ambulatory Visit: Payer: Self-pay

## 2019-11-26 VITALS — BP 130/80 | HR 59 | Ht 73.0 in | Wt 300.0 lb

## 2019-11-26 DIAGNOSIS — R0789 Other chest pain: Secondary | ICD-10-CM | POA: Diagnosis not present

## 2019-11-26 NOTE — Patient Instructions (Signed)

## 2019-12-01 ENCOUNTER — Encounter: Payer: Self-pay | Admitting: Family Medicine

## 2019-12-01 MED ORDER — ESZOPICLONE 1 MG PO TABS: ORAL_TABLET | ORAL | 0 refills | Status: DC

## 2019-12-01 MED ORDER — INDOMETHACIN 50 MG PO CAPS
ORAL_CAPSULE | ORAL | 1 refills | Status: DC
Start: 2019-12-01 — End: 2021-08-23

## 2019-12-01 NOTE — Telephone Encounter (Signed)
OK, I eRx'd indocin for pain and lunesta as per his request. I need to see him for f/u insomnia/lunesta in 1 month.-thx

## 2019-12-01 NOTE — Telephone Encounter (Signed)
MyChart message read.

## 2019-12-03 NOTE — Telephone Encounter (Signed)
Patient was last seen 76/21. He was able to pick up indocin but lunesta is not covered/available. Please advise of any alternative medication options.

## 2019-12-31 ENCOUNTER — Ambulatory Visit: Payer: 59 | Admitting: Family Medicine

## 2020-01-04 ENCOUNTER — Encounter: Payer: Self-pay | Admitting: Family Medicine

## 2020-01-04 ENCOUNTER — Other Ambulatory Visit: Payer: Self-pay

## 2020-01-04 ENCOUNTER — Ambulatory Visit (INDEPENDENT_AMBULATORY_CARE_PROVIDER_SITE_OTHER): Payer: 59 | Admitting: Family Medicine

## 2020-01-04 VITALS — BP 133/82 | HR 79 | Temp 97.6°F | Resp 16 | Wt 304.4 lb

## 2020-01-04 DIAGNOSIS — F5101 Primary insomnia: Secondary | ICD-10-CM | POA: Diagnosis not present

## 2020-01-04 DIAGNOSIS — M10279 Drug-induced gout, unspecified ankle and foot: Secondary | ICD-10-CM

## 2020-01-04 DIAGNOSIS — E7800 Pure hypercholesterolemia, unspecified: Secondary | ICD-10-CM

## 2020-01-04 DIAGNOSIS — T50905D Adverse effect of unspecified drugs, medicaments and biological substances, subsequent encounter: Secondary | ICD-10-CM

## 2020-01-04 DIAGNOSIS — I10 Essential (primary) hypertension: Secondary | ICD-10-CM

## 2020-01-04 MED ORDER — ESZOPICLONE 1 MG PO TABS: ORAL_TABLET | ORAL | 1 refills | Status: DC

## 2020-01-04 MED ORDER — ATORVASTATIN CALCIUM 20 MG PO TABS
20.0000 mg | ORAL_TABLET | Freq: Every day | ORAL | 2 refills | Status: DC
Start: 2020-01-04 — End: 2020-09-12

## 2020-01-04 NOTE — Progress Notes (Signed)
OFFICE VISIT  01/04/2020  CC:  Chief Complaint  Patient presents with  . Follow-up    gout   and f/u HTN  HPI:    Patient is a 44 y.o. Caucasian male who presents for 3 mo f/u HTN and acute gouty arthritis. A/P as of last visit: "1) Acute arthritis, suspect gout, suspect secondary to med-->? Rosuva, ? Losartan. Trial of rosuvastatin holiday x 3 wks, check uric acid level today. Continue to use NSAID prn when pain increases.  2) HTN: The current medical regimen is effective;  continue present plan and medications. Continue losartan, but we may have to consider change if trial off rosuva does not help suspected gouty arthritis."  INTERIM HX: He stopped his rosuvastatin as planned. Pain still comes and goes. Takes indocin occasionally.  Limiting alc more. No swelling.  No sharp/severe pains have recurred but more of a soreness feeling.  Some redness when they feel sore.  No episodes in which he could barely bear wt like before. Cutting back on red meats and seafood. Still same joints: 2nd and 3rd toes on R foot (MTPs) and 1st MTP on L foot. The joint pains occur even when not very active.  HTN: home bp's 130/80 consistently. Taking losartan 50mg  qd.  Hx of insomnia, has been on lunesta in the past and it was helpful.  We rx'd this for him recently and there has been some sort of insurance/approval/pharmacy issue b/c he says he looked in his formulary and he says it is "covered".    ROS: no fevers, no CP, no SOB, no wheezing, no cough, no dizziness, no HAs, no rashes, no melena/hematochezia.  No polyuria or polydipsia.  No myalgias.  No focal weakness, paresthesias, or tremors.  No acute vision or hearing abnormalities. No n/v/d or abd pain.  No palpitations.    Past Medical History:  Diagnosis Date  . Chest pain 07/2019   ACS ruled out. Stress test and echo normal.  BP was out of control at the time of his eval/hosp.  . Esophageal abnormality 08/2019   mildly dilated distal  esophagus + food debris in esoph noted on coronary CT calcium scoring imaging--refer to GI for eval for possible esoph dysmotility--pt declined this as of 08/2019.  Marland Kitchen Essential hypertension   . Gout    toes  . Hyperlipidemia    statin started at the time of CP admission 07/2019-rosuva  . Insomnia    lunesta helpful  . Mild intermittent asthma    activity induced, typically.  Allergies trigger as a kid.  . Obesity, Class II, BMI 35-39.9   . OSA on CPAP    [place] pul[facility]  . Plantar fasciitis 01/2019   steroid inj, Dr. Paulla Dolly  . Pulmonary nodules 08/2019   incidentally noted on coronary calcium scoring imaging-> cards to arrange 1 yr f/u CT    Past Surgical History:  Procedure Laterality Date  . APPENDECTOMY  1989  . CARDIOVASCULAR STRESS TEST  07/31/2019   NO ISCHEMIA  . coronary ct  08/2019   calcium score ZERO.    . TONSILLECTOMY AND ADENOIDECTOMY  1979  . TRANSTHORACIC ECHOCARDIOGRAM  07/31/2019   normal    Outpatient Medications Prior to Visit  Medication Sig Dispense Refill  . aspirin EC 81 MG EC tablet Take 1 tablet (81 mg total) by mouth daily. 150 tablet 3  . indomethacin (INDOCIN) 50 MG capsule 1 tab po tid prn pain 30 capsule 1  . losartan (COZAAR) 50 MG tablet Take  1 tablet (50 mg total) by mouth daily. 90 tablet 3  . eszopiclone (LUNESTA) 1 MG TABS tablet 1-3 tabs po qhs. Take immediately before bedtime 90 tablet 0   No facility-administered medications prior to visit.    No Known Allergies  ROS As per HPI  PE: Vitals with BMI 01/04/2020 11/26/2019 09/22/2019  Height - 6\' 1"  6\' 1"   Weight 304 lbs 6 oz 300 lbs 301 lbs  BMI - 88.89 16.94  Systolic 503 888 280  Diastolic 82 80 73  Pulse 79 59 73     Gen: Alert, well appearing.  Patient is oriented to person, place, time, and situation. AFFECT: pleasant, lucid thought and speech. Feet: Right ankle, foot, and toes w/out swelling or erythem or limitation of ROM.  Mild tenderness to firm palpation over MTPs of 2nd  and 3rd toes on R foot. L ankle, foot, toes all w/out erythema, tenderness, swelling or limitation of any ROM.  LABS:  Lab Results  Component Value Date   LABURIC 7.3 09/22/2019     Chemistry      Component Value Date/Time   NA 141 09/04/2019 0734   K 4.5 09/04/2019 0734   CL 101 09/04/2019 0734   CO2 24 09/04/2019 0734   BUN 15 09/04/2019 0734   CREATININE 1.10 09/04/2019 0734      Component Value Date/Time   CALCIUM 9.4 09/04/2019 0734   ALKPHOS 92 09/04/2019 0734   AST 30 09/04/2019 0734   ALT 44 09/04/2019 0734   BILITOT 0.4 09/04/2019 0734     Lab Results  Component Value Date   WBC 6.3 07/31/2019   HGB 15.4 07/31/2019   HCT 44.6 07/31/2019   MCV 88.1 07/31/2019   PLT 224 07/31/2019   Lab Results  Component Value Date   HGBA1C 5.5 07/31/2019    IMPRESSION AND PLAN:  1) Joint pain in toes: still feel like gout is likely etiology. Some improvement since getting off rosuva but I'm not entirely convinced that this med can be clearly implicated as the cause of his gout.  He made some dietary adjustments since last visit as well. Nevertheless, he is apprehensive about restart of rosuva to see if severe/sharp episodes return, but he is open to trial of different statin.  Atorvastatin 20mg  qd rx'd today.  2) HTN: well controlled on losartan 50mg  qd.  Continue. Lytes/cr good 4 mo ago.  3) Hypercholesterolemia: needs statin if he can tolerate it.  See #1 above: trial of atorva 20mg  qd started today.  4) Insomnia: will try rx'ing lunesta 1mg , 1-3 tabs po qhs, #90, RF x1. If he does end up getting this med then we'll get [place] at next f/u visit in 2 mo.  Flu shot today.  An After Visit Summary was printed and given to the patient.  FOLLOW UP: Return in about 2 months (around 03/05/2020) for f/u gout/hyperchol.  Signed:  Crissie Sickles, MD           01/04/2020

## 2020-02-29 ENCOUNTER — Telehealth: Payer: Self-pay

## 2020-02-29 ENCOUNTER — Ambulatory Visit: Payer: 59 | Admitting: Family Medicine

## 2020-02-29 DIAGNOSIS — Z0289 Encounter for other administrative examinations: Secondary | ICD-10-CM

## 2020-02-29 NOTE — Telephone Encounter (Signed)
Patient seen at [facility].

## 2020-02-29 NOTE — Telephone Encounter (Signed)
Please assist with scheduling at [facility] office for virtual appt, if possible.  Cody Cruz - Client TELEPHONE ADVICE RECORD [facility] Patient Name: Cody Cruz Gender: Male DOB: Jan 26, 1976 Age: 44 Y 36 M 14 D Return Phone Number: [phone] (Primary) Address: City/State/Zip: [place] [address] Client [facility] - Client Client Site [place] [facility] [place] Physician Crissie Sickles - MD Contact Type Call Who Is Calling Patient / Member / Family / Caregiver Call Type Triage / Clinical Relationship To Patient Self Return Phone Number [phone] (Primary) Chief Complaint Cough Reason for Call Symptomatic / Request for [name] states that he needs to reschedule his labs. Can he be seen today for possible Bronchitis? Translation No Nurse Assessment Nurse: Rolena Infante, RN, Patrice Date/Time (Eastern Time): 02/29/2020 9:10:08 AM Confirm and document reason for call. If symptomatic, describe symptoms. ---Caller states he has a runny nose for 2 weeks Cough present. Drainage from nose and congestions. Feels has sinus infection. Low grade Felt warm at night Cough and congestion has gotten worse in the last 3 days. Needs an appointment. Pt states SOB, pressure in his chest. Does the patient have any new or worsening symptoms? ---Yes Will a triage be completed? ---Yes Related visit to physician within the last 2 weeks? ---No Does the PT have any chronic conditions? (i.e. diabetes, asthma, this includes High risk factors for pregnancy, etc.) ---Yes List chronic conditions. ---HTN, Asthma Is this a behavioral health or substance abuse call? ---No Guidelines Guideline Title Affirmed Question Affirmed Notes Nurse Date/Time (Eastern Time) Asthma Attack [1] MILD asthma attack (e.g., no SOB at rest, mild SOB with walking, speaks normally in sentences, mild wheezing) AND [2] persists > 24 hours on  appropriate treatment Rolena Infante, RN, Patrice 02/29/2020 9:14:05 AM Disp. Time Eilene Ghazi Time) Disposition Final User 02/29/2020 9:19:56 AM See PCP within 24 Hours Yes Rolena Infante, RN, Patrice PLEASE NOTE: All timestamps contained within this report are represented as [place] Standard Time. CONFIDENTIALTY NOTICE: This fax transmission is intended only for the addressee. It contains information that is legally privileged, confidential or otherwise protected from use or disclosure. If you are not the intended recipient, you are strictly prohibited from reviewing, disclosing, copying using or disseminating any of this information or taking any action in reliance on or regarding this information. If you have received this fax in error, please notify us immediately by telephone so that we can arrange for its return to [place]. Phone: [phone], Toll-Free: [phone], Fax: [phone] Page: 2 of 2 Call Id: [id number] [place] Disagree/Comply Comply Caller Understands Yes PreDisposition Call Doctor Care Advice Given Per Guideline SEE PCP WITHIN 24 HOURS: * IF OFFICE WILL BE OPEN: You need to be examined within the next 24 hours. Call your doctor (or NP/PA) when the office opens and make an appointment. ASTHMA QUICK-RELIEF MEDICINE: DRINKING FLUIDS AND USING A HUMIDIFIER: CALL BACK IF: * Wheezing is not improved after neb or inhaler * Inhaled asthma medicine (neb or MDI) is needed more often than every 4 hours * Wheezing is not completely cleared by 5 days * You become worse Referrals REFERRED TO PCP OFFICE

## 2020-02-29 NOTE — Progress Notes (Deleted)
OFFICE VISIT  02/29/2020  CC: No chief complaint on file.   HPI:    Patient is a 44 y.o. Caucasian male who presents for 2 mo f/u gout, HTN, and hypercholesterolemia. A/P as of last visit: "1) Joint pain in toes: still feel like gout is likely etiology. Some improvement since getting off rosuva but I'm not entirely convinced that this med can be clearly implicated as the cause of his gout.  He made some dietary adjustments since last visit as well. Nevertheless, he is apprehensive about restart of rosuva to see if severe/sharp episodes return, but he is open to trial of different statin.  Atorvastatin 20mg  qd rx'd today.  2) HTN: well controlled on losartan 50mg  qd.  Continue. Lytes/cr good 4 mo ago.  3) Hypercholesterolemia: needs statin if he can tolerate it.  See #1 above: trial of atorva 20mg  qd started today.  4) Insomnia: will try rx'ing lunesta 1mg , 1-3 tabs po qhs, #90, RF x1. If he does end up getting this med then we'll get [place] [address] at next f/u visit in 2 mo."  INTERIM HX: ***  Past Medical History:  Diagnosis Date  . Chest pain 07/2019   ACS ruled out. Stress test and echo normal.  BP was out of control at the time of his eval/hosp.  . Esophageal abnormality 08/2019   mildly dilated distal esophagus + food debris in esoph noted on coronary CT calcium scoring imaging--refer to GI for eval for possible esoph dysmotility--pt declined this as of 08/2019.  Marland Kitchen Essential hypertension   . Gout    toes  . Hyperlipidemia    statin started at the time of CP admission 07/2019-rosuva  . Insomnia    lunesta helpful  . Mild intermittent asthma    activity induced, typically.  Allergies trigger as a kid.  . Obesity, Class II, BMI 35-39.9   . OSA on CPAP    [place] pulm  . Plantar fasciitis 01/2019   steroid inj, Dr. Paulla Dolly  . Pulmonary nodules 08/2019   incidentally noted on coronary calcium scoring imaging-> cards to arrange 1 yr f/u CT    Past Surgical History:  Procedure  Laterality Date  . APPENDECTOMY  1989  . CARDIOVASCULAR STRESS TEST  07/31/2019   NO ISCHEMIA  . coronary ct  08/2019   calcium score ZERO.    . TONSILLECTOMY AND ADENOIDECTOMY  1979  . TRANSTHORACIC ECHOCARDIOGRAM  07/31/2019   normal    Outpatient Medications Prior to Visit  Medication Sig Dispense Refill  . aspirin EC 81 MG EC tablet Take 1 tablet (81 mg total) by mouth daily. 150 tablet 3  . atorvastatin (LIPITOR) 20 MG tablet Take 1 tablet (20 mg total) by mouth daily. 30 tablet 2  . eszopiclone (LUNESTA) 1 MG TABS tablet 1-3 tabs po qhs. Take immediately before bedtime 90 tablet 1  . indomethacin (INDOCIN) 50 MG capsule 1 tab po tid prn pain 30 capsule 1  . losartan (COZAAR) 50 MG tablet Take 1 tablet (50 mg total) by mouth daily. 90 tablet 3   No facility-administered medications prior to visit.    No Known Allergies  ROS As per HPI  PE: Vitals with BMI 01/04/2020 11/26/2019 09/22/2019  Height - 6\' 1"  6\' 1"   Weight 304 lbs 6 oz 300 lbs 301 lbs  BMI - 76.81 15.72  Systolic 620 355 974  Diastolic 82 80 73  Pulse 79 59 73     ***  LABS:    Chemistry  Component Value Date/Time   NA 141 09/04/2019 0734   K 4.5 09/04/2019 0734   CL 101 09/04/2019 0734   CO2 24 09/04/2019 0734   BUN 15 09/04/2019 0734   CREATININE 1.10 09/04/2019 0734      Component Value Date/Time   CALCIUM 9.4 09/04/2019 0734   ALKPHOS 92 09/04/2019 0734   AST 30 09/04/2019 0734   ALT 44 09/04/2019 0734   BILITOT 0.4 09/04/2019 0734     Lab Results  Component Value Date   CHOL 169 09/04/2019   HDL 45 09/04/2019   LDLCALC 104 (H) 09/04/2019   TRIG 112 09/04/2019   CHOLHDL 3.8 09/04/2019   Lab Results  Component Value Date   WBC 6.3 07/31/2019   HGB 15.4 07/31/2019   HCT 44.6 07/31/2019   MCV 88.1 07/31/2019   PLT 224 07/31/2019   Lab Results  Component Value Date   TSH 1.55 11/26/2016   Lab Results  Component Value Date   HGBA1C 5.5 07/31/2019   Lab Results   Component Value Date   LABURIC 7.3 09/22/2019   IMPRESSION AND PLAN:  No problem-specific Assessment & Plan notes found for this encounter.   An After Visit Summary was printed and given to the patient.  FOLLOW UP: No follow-ups on file.  Signed:  Crissie Sickles, MD           02/29/2020

## 2020-08-24 ENCOUNTER — Ambulatory Visit (INDEPENDENT_AMBULATORY_CARE_PROVIDER_SITE_OTHER)
Admission: RE | Admit: 2020-08-24 | Discharge: 2020-08-24 | Disposition: A | Discharge status: Home / Self Care | Payer: 59 | Source: Ambulatory Visit | Attending: Cardiovascular Disease | Admitting: Cardiovascular Disease

## 2020-08-24 ENCOUNTER — Other Ambulatory Visit: Payer: Self-pay | Admitting: *Deleted

## 2020-08-24 ENCOUNTER — Other Ambulatory Visit: Payer: Self-pay

## 2020-08-24 DIAGNOSIS — R918 Other nonspecific abnormal finding of lung field: Secondary | ICD-10-CM

## 2020-08-24 NOTE — Progress Notes (Signed)
Spoke to patient about results of CT and we discussed the pulmonary nodules and one that has gotten larger.  Order for referral to pulmonology was entered and patient advised that someone would reach out and schedule the appointment.

## 2020-08-29 ENCOUNTER — Telehealth: Payer: Self-pay | Admitting: Pulmonary Disease

## 2020-08-29 NOTE — Telephone Encounter (Signed)
Reviewed the CT showing new lung nodule in the right upper lobe measuring 1 cm, slight increase in known previous lung nodule   Schedule to see Dr. Valeta Harms

## 2020-08-29 NOTE — Telephone Encounter (Signed)
Schedule to see Dr Valeta Harms

## 2020-08-30 NOTE — Telephone Encounter (Signed)
Patient is scheduled with Dr. Valeta Harms for 09/12/20. Nothing further needed.

## 2020-09-12 ENCOUNTER — Encounter: Payer: Self-pay | Admitting: Pulmonary Disease

## 2020-09-12 ENCOUNTER — Other Ambulatory Visit: Payer: Self-pay

## 2020-09-12 ENCOUNTER — Telehealth: Payer: Self-pay | Admitting: Pulmonary Disease

## 2020-09-12 ENCOUNTER — Ambulatory Visit (INDEPENDENT_AMBULATORY_CARE_PROVIDER_SITE_OTHER): Payer: 59 | Admitting: Pulmonary Disease

## 2020-09-12 VITALS — BP 120/90 | HR 65 | Ht 73.0 in | Wt 301.5 lb

## 2020-09-12 DIAGNOSIS — R911 Solitary pulmonary nodule: Secondary | ICD-10-CM | POA: Diagnosis not present

## 2020-09-12 DIAGNOSIS — R590 Localized enlarged lymph nodes: Secondary | ICD-10-CM

## 2020-09-12 NOTE — Progress Notes (Signed)
Synopsis: Referred in June 2022 for lung nodule, adenopathy by Tammi Sou, MD  Subjective:   PATIENT ID: Cody Cruz GENDER: male DOB: Feb 11, 1976, MRN: [id number]  Chief Complaint  Patient presents with   Consult    Here for follow up on lung nodules seen on CT chest.   Not having any breathing issues.     This is a 45 year old gentleman, past medical history of OSA on CPAP, hypertension, hyperlipidemia, never smoker.  Patient was initially found to have an incidental right upper lobe pulmonary nodule.  Nodule seen on previous imaging within the superior segment was 3 mm, increased to 6 mm and now on recent CT imaging August 24, 2020 is 1 cm within the right upper lobe along the periphery.  Additionally there was a 1.7 cm right paratracheal node that is enlarged.  He has not had a PET scan.  CT imaging reviewed today in the office with patient.From a respiratory standpoint he has no complaints today.   Past Medical History:  Diagnosis Date   Chest pain 07/2019   ACS ruled out. Stress test and echo normal.  BP was out of control at the time of his eval/hosp.   Esophageal abnormality 08/2019   mildly dilated distal esophagus + food debris in esoph noted on coronary CT calcium scoring imaging--refer to GI for eval for possible esoph dysmotility--pt declined this as of 08/2019.   Essential hypertension    Gout    toes   Hyperlipidemia    statin started at the time of CP admission 07/2019-rosuva   Insomnia    lunesta helpful   Mild intermittent asthma    activity induced, typically.  Allergies trigger as a kid.   Obesity, Class II, BMI 35-39.9    OSA on CPAP    [place] pulm   Plantar fasciitis 01/2019   steroid inj, Dr. Paulla Dolly   Pulmonary nodules 08/2019   incidentally noted on coronary calcium scoring imaging-> cards to arrange 1 yr f/u CT     Family History  Problem Relation Age of Onset   Arthritis Mother    Hyperlipidemia Mother    Hypertension Mother    Hypertension  Father      Past Surgical History:  Procedure Laterality Date   APPENDECTOMY  1989   CARDIOVASCULAR STRESS TEST  07/31/2019   NO ISCHEMIA   coronary ct  08/2019   calcium score ZERO.     TONSILLECTOMY AND ADENOIDECTOMY  1979   TRANSTHORACIC ECHOCARDIOGRAM  07/31/2019   normal    Social History   Socioeconomic History   Marital status: Married    Spouse name: Not on file   Number of children: Not on file   Years of education: Not on file   Highest education level: Not on file  Occupational History   Not on file  Tobacco Use   Smoking status: Never   Smokeless tobacco: Never  Substance and Sexual Activity   Alcohol use: Yes   Drug use: No   Sexual activity: Not on file  Other Topics Concern   Not on file  Social History Narrative   Married, two children (one son and one daughter).   Education: [name].   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   No tobacco.  Alc 3-4 beers per week avg.  No alc/drugs.   Exercise: CV and wt's at a GYM 3-5 days a week when feet are not hurting him.   Social Determinants of Health   Financial  Resource Strain: Not on file  Food Insecurity: Not on file  Transportation Needs: Not on file  Physical Activity: Not on file  Stress: Not on file  Social Connections: Not on file  Intimate Partner Violence: Not on file     No Known Allergies   Outpatient Medications Prior to Visit  Medication Sig Dispense Refill   aspirin EC 81 MG EC tablet Take 1 tablet (81 mg total) by mouth daily. 150 tablet 3   eszopiclone (LUNESTA) 1 MG TABS tablet 1-3 tabs po qhs. Take immediately before bedtime 90 tablet 1   indomethacin (INDOCIN) 50 MG capsule 1 tab po tid prn pain 30 capsule 1   losartan (COZAAR) 50 MG tablet Take 1 tablet (50 mg total) by mouth daily. 90 tablet 3   atorvastatin (LIPITOR) 20 MG tablet Take 1 tablet (20 mg total) by mouth daily. 30 tablet 2   No facility-administered medications prior to visit.     Review of Systems  Constitutional:  Negative for chills, fever, malaise/fatigue and weight loss.  HENT:  Negative for hearing loss, sore throat and tinnitus.   Eyes:  Negative for blurred vision and double vision.  Respiratory:  Negative for cough, hemoptysis, sputum production, shortness of breath, wheezing and stridor.   Cardiovascular:  Negative for chest pain, palpitations, orthopnea, leg swelling and PND.  Gastrointestinal:  Negative for abdominal pain, constipation, diarrhea, heartburn, nausea and vomiting.  Genitourinary:  Negative for dysuria, hematuria and urgency.  Musculoskeletal:  Negative for joint pain and myalgias.  Skin:  Negative for itching and rash.  Neurological:  Negative for dizziness, tingling, weakness and headaches.  Endo/Heme/Allergies:  Negative for environmental allergies. Does not bruise/bleed easily.  Psychiatric/Behavioral:  Negative for depression. The patient is not nervous/anxious and does not have insomnia.   All other systems reviewed and are negative.   Objective:  Physical Exam Vitals reviewed.  Constitutional:      General: He is not in acute distress.    Appearance: He is well-developed. He is obese.  HENT:     Head: Normocephalic and atraumatic.  Eyes:     General: No scleral icterus.    Conjunctiva/sclera: Conjunctivae normal.     Pupils: Pupils are equal, round, and reactive to light.  Neck:     Vascular: No JVD.     Trachea: No tracheal deviation.  Cardiovascular:     Rate and Rhythm: Normal rate and regular rhythm.     Heart sounds: Normal heart sounds. No murmur heard. Pulmonary:     Effort: Pulmonary effort is normal. No tachypnea, accessory muscle usage or respiratory distress.     Breath sounds: Normal breath sounds. No stridor. No wheezing, rhonchi or rales.  Abdominal:     General: Bowel sounds are normal. There is no distension.     Palpations: Abdomen is soft.     Tenderness: There is no abdominal tenderness.   Musculoskeletal:        General: No tenderness.     Cervical back: Neck supple.  Lymphadenopathy:     Cervical: No cervical adenopathy.  Skin:    General: Skin is warm and dry.     Capillary Refill: Capillary refill takes less than 2 seconds.     Findings: No rash.  Neurological:     Mental Status: He is alert and oriented to person, place, and time.  Psychiatric:        Behavior: Behavior normal.     Vitals:   09/12/20 0936  BP: 120/90  Pulse: 65  SpO2: 96%  Weight: (!) 301 lb 8 oz (136.8 kg)  Height: 6\' 1"  (1.854 m)   96% on RA BMI Readings from Last 3 Encounters:  09/12/20 39.78 kg/m  01/04/20 40.16 kg/m  11/26/19 39.58 kg/m   Wt Readings from Last 3 Encounters:  09/12/20 (!) 301 lb 8 oz (136.8 kg)  01/04/20 (!) 304 lb 6.4 oz (138.1 kg)  11/26/19 300 lb (136.1 kg)     CBC    Component Value Date/Time   WBC 6.3 07/31/2019 0607   RBC 5.06 07/31/2019 0607   HGB 15.4 07/31/2019 0607   HCT 44.6 07/31/2019 0607   PLT 224 07/31/2019 0607   MCV 88.1 07/31/2019 0607   MCH 30.4 07/31/2019 0607   MCHC 34.5 07/31/2019 0607   RDW 12.1 07/31/2019 0607   LYMPHSABS 1.8 11/26/2016 0827   MONOABS 0.4 11/26/2016 0827   EOSABS 0.1 11/26/2016 0827   BASOSABS 0.0 11/26/2016 0827     Chest Imaging: 08/24/2020: Right upper lobe 10 mm peripheral lung nodule slowly enlarging over time.  1.7 cm right paratracheal node. The patient's images have been independently reviewed by me.    Pulmonary Functions Testing Results: No flowsheet data found.  FeNO:   Pathology:   Echocardiogram:   Heart Catheterization:     Assessment & Plan:     ICD-10-CM   1. Nodule of upper lobe of right lung  R91.1 NM PET Image Initial (PI) Skull Base To Thigh    Procedural/ Surgical Case Request: VIDEO BRONCHOSCOPY WITH ENDOBRONCHIAL NAVIGATION, VIDEO BRONCHOSCOPY WITH ENDOBRONCHIAL ULTRASOUND    Ambulatory referral to Pulmonology    2. Mediastinal adenopathy  R59.0 NM PET Image Initial  (PI) Skull Base To Thigh    Procedural/ Surgical Case Request: VIDEO BRONCHOSCOPY WITH ENDOBRONCHIAL NAVIGATION, VIDEO BRONCHOSCOPY WITH ENDOBRONCHIAL ULTRASOUND    Ambulatory referral to Pulmonology      Discussion:  This is a 45 year old gentleman, past medical history of OSA on CPAP, obesity, lifelong non-smoker, secondhand smoke exposure from parents.  Father recently passed from small cell lung cancer.  Plan: We discussed today the risk benefits and alternatives of proceeding with navigational bronchoscopy and video bronchoscopy with endobronchial ultrasound and transbronchial needle biopsies. We reviewed his CT imaging today in the office.  We discussed the risk of bleeding and pneumothorax. I think we can planned a dual procedure in which we stage his mediastinum for the right-sided paratracheal node as well as going after the 10 mm right upper lobe pulmonary nodule.  Patient is agreeable to this plan.  We reviewed CT imaging today in the office. I think he also needs a PET scan.  It would be ideal if we were able to get the PET scan prior to the procedure.  But I do not want to delay his work-up for the abnormality as the lymph node within the chest has grown in size since last imaging.    Current Outpatient Medications:    aspirin EC 81 MG EC tablet, Take 1 tablet (81 mg total) by mouth daily., Disp: 150 tablet, Rfl: 3   eszopiclone (LUNESTA) 1 MG TABS tablet, 1-3 tabs po qhs. Take immediately before bedtime, Disp: 90 tablet, Rfl: 1   indomethacin (INDOCIN) 50 MG capsule, 1 tab po tid prn pain, Disp: 30 capsule, Rfl: 1   losartan (COZAAR) 50 MG tablet, Take 1 tablet (50 mg total) by mouth daily., Disp: 90 tablet, Rfl: 3  I spent 61 minutes dedicated to the care of this  patient on the date of this encounter to include pre-visit review of records, face-to-face time with the patient discussing conditions above, post visit ordering of testing, clinical documentation with the  electronic health record, making appropriate referrals as documented, and communicating necessary findings to members of the patients care team.   [name], DO [facility] 09/12/2020 9:59 AM

## 2020-09-12 NOTE — Telephone Encounter (Signed)
Sched ENB/EBUS for 7/12 at 9:15 at [facility].  Covid test scheduled for 7/12 at 9:15.  PET scheduled for 7/13.  Pt had CT chest w/o on 6/8 at [facility].  They no longer have on scanner but spoke to [place] at [place] she will make disk.  Gave all appt info to pt.

## 2020-09-12 NOTE — H&P (View-Only) (Signed)
Synopsis: Referred in June 2022 for lung nodule, adenopathy by Tammi Sou, MD  Subjective:   PATIENT ID: Cody Cruz GENDER: male DOB: 01-Feb-1976, MRN: [id number]  Chief Complaint  Patient presents with   Consult    Here for follow up on lung nodules seen on CT chest.   Not having any breathing issues.     This is a 45 year old gentleman, past medical history of OSA on CPAP, hypertension, hyperlipidemia, never smoker.  Patient was initially found to have an incidental right upper lobe pulmonary nodule.  Nodule seen on previous imaging within the superior segment was 3 mm, increased to 6 mm and now on recent CT imaging August 24, 2020 is 1 cm within the right upper lobe along the periphery.  Additionally there was a 1.7 cm right paratracheal node that is enlarged.  He has not had a PET scan.  CT imaging reviewed today in the office with patient.From a respiratory standpoint he has no complaints today.   Past Medical History:  Diagnosis Date   Chest pain 07/2019   ACS ruled out. Stress test and echo normal.  BP was out of control at the time of his eval/hosp.   Esophageal abnormality 08/2019   mildly dilated distal esophagus + food debris in esoph noted on coronary CT calcium scoring imaging--refer to GI for eval for possible esoph dysmotility--pt declined this as of 08/2019.   Essential hypertension    Gout    toes   Hyperlipidemia    statin started at the time of CP admission 07/2019-rosuva   Insomnia    lunesta helpful   Mild intermittent asthma    activity induced, typically.  Allergies trigger as a kid.   Obesity, Class II, BMI 35-39.9    OSA on CPAP    [facility]   Plantar fasciitis 01/2019   steroid inj, Dr. Paulla Dolly   Pulmonary nodules 08/2019   incidentally noted on coronary calcium scoring imaging-> cards to arrange 1 yr f/u CT     Family History  Problem Relation Age of Onset   Arthritis Mother    Hyperlipidemia Mother    Hypertension Mother    Hypertension  Father      Past Surgical History:  Procedure Laterality Date   APPENDECTOMY  1989   CARDIOVASCULAR STRESS TEST  07/31/2019   NO ISCHEMIA   coronary ct  08/2019   calcium score ZERO.     TONSILLECTOMY AND ADENOIDECTOMY  1979   TRANSTHORACIC ECHOCARDIOGRAM  07/31/2019   normal    Social History   Socioeconomic History   Marital status: Married    Spouse name: Not on file   Number of children: Not on file   Years of education: Not on file   Highest education level: Not on file  Occupational History   Not on file  Tobacco Use   Smoking status: Never   Smokeless tobacco: Never  Substance and Sexual Activity   Alcohol use: Yes   Drug use: No   Sexual activity: Not on file  Other Topics Concern   Not on file  Social History Narrative   Married, two children (one son and one daughter).   Education: [name].   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   No tobacco.  Alc 3-4 beers per week avg.  No alc/drugs.   Exercise: CV and wt's at a GYM 3-5 days a week when feet are not hurting him.   Social Determinants of Health   Financial  Resource Strain: Not on file  Food Insecurity: Not on file  Transportation Needs: Not on file  Physical Activity: Not on file  Stress: Not on file  Social Connections: Not on file  Intimate Partner Violence: Not on file     No Known Allergies   Outpatient Medications Prior to Visit  Medication Sig Dispense Refill   aspirin EC 81 MG EC tablet Take 1 tablet (81 mg total) by mouth daily. 150 tablet 3   eszopiclone (LUNESTA) 1 MG TABS tablet 1-3 tabs po qhs. Take immediately before bedtime 90 tablet 1   indomethacin (INDOCIN) 50 MG capsule 1 tab po tid prn pain 30 capsule 1   losartan (COZAAR) 50 MG tablet Take 1 tablet (50 mg total) by mouth daily. 90 tablet 3   atorvastatin (LIPITOR) 20 MG tablet Take 1 tablet (20 mg total) by mouth daily. 30 tablet 2   No facility-administered medications prior to visit.     Review of Systems  Constitutional:  Negative for chills, fever, malaise/fatigue and weight loss.  HENT:  Negative for hearing loss, sore throat and tinnitus.   Eyes:  Negative for blurred vision and double vision.  Respiratory:  Negative for cough, hemoptysis, sputum production, shortness of breath, wheezing and stridor.   Cardiovascular:  Negative for chest pain, palpitations, orthopnea, leg swelling and PND.  Gastrointestinal:  Negative for abdominal pain, constipation, diarrhea, heartburn, nausea and vomiting.  Genitourinary:  Negative for dysuria, hematuria and urgency.  Musculoskeletal:  Negative for joint pain and myalgias.  Skin:  Negative for itching and rash.  Neurological:  Negative for dizziness, tingling, weakness and headaches.  Endo/Heme/Allergies:  Negative for environmental allergies. Does not bruise/bleed easily.  Psychiatric/Behavioral:  Negative for depression. The patient is not nervous/anxious and does not have insomnia.   All other systems reviewed and are negative.   Objective:  Physical Exam Vitals reviewed.  Constitutional:      General: He is not in acute distress.    Appearance: He is well-developed. He is obese.  HENT:     Head: Normocephalic and atraumatic.  Eyes:     General: No scleral icterus.    Conjunctiva/sclera: Conjunctivae normal.     Pupils: Pupils are equal, round, and reactive to light.  Neck:     Vascular: No JVD.     Trachea: No tracheal deviation.  Cardiovascular:     Rate and Rhythm: Normal rate and regular rhythm.     Heart sounds: Normal heart sounds. No murmur heard. Pulmonary:     Effort: Pulmonary effort is normal. No tachypnea, accessory muscle usage or respiratory distress.     Breath sounds: Normal breath sounds. No stridor. No wheezing, rhonchi or rales.  Abdominal:     General: Bowel sounds are normal. There is no distension.     Palpations: Abdomen is soft.     Tenderness: There is no abdominal tenderness.   Musculoskeletal:        General: No tenderness.     Cervical back: Neck supple.  Lymphadenopathy:     Cervical: No cervical adenopathy.  Skin:    General: Skin is warm and dry.     Capillary Refill: Capillary refill takes less than 2 seconds.     Findings: No rash.  Neurological:     Mental Status: He is alert and oriented to person, place, and time.  Psychiatric:        Behavior: Behavior normal.     Vitals:   09/12/20 0936  BP: 120/90  Pulse: 65  SpO2: 96%  Weight: (!) 301 lb 8 oz (136.8 kg)  Height: 6\' 1"  (1.854 m)   96% on RA BMI Readings from Last 3 Encounters:  09/12/20 39.78 kg/m  01/04/20 40.16 kg/m  11/26/19 39.58 kg/m   Wt Readings from Last 3 Encounters:  09/12/20 (!) 301 lb 8 oz (136.8 kg)  01/04/20 (!) 304 lb 6.4 oz (138.1 kg)  11/26/19 300 lb (136.1 kg)     CBC    Component Value Date/Time   WBC 6.3 07/31/2019 0607   RBC 5.06 07/31/2019 0607   HGB 15.4 07/31/2019 0607   HCT 44.6 07/31/2019 0607   PLT 224 07/31/2019 0607   MCV 88.1 07/31/2019 0607   MCH 30.4 07/31/2019 0607   MCHC 34.5 07/31/2019 0607   RDW 12.1 07/31/2019 0607   LYMPHSABS 1.8 11/26/2016 0827   MONOABS 0.4 11/26/2016 0827   EOSABS 0.1 11/26/2016 0827   BASOSABS 0.0 11/26/2016 0827     Chest Imaging: 08/24/2020: Right upper lobe 10 mm peripheral lung nodule slowly enlarging over time.  1.7 cm right paratracheal node. The patient's images have been independently reviewed by me.    Pulmonary Functions Testing Results: No flowsheet data found.  FeNO:   Pathology:   Echocardiogram:   Heart Catheterization:     Assessment & Plan:     ICD-10-CM   1. Nodule of upper lobe of right lung  R91.1 NM PET Image Initial (PI) Skull Base To Thigh    Procedural/ Surgical Case Request: VIDEO BRONCHOSCOPY WITH ENDOBRONCHIAL NAVIGATION, VIDEO BRONCHOSCOPY WITH ENDOBRONCHIAL ULTRASOUND    Ambulatory referral to Pulmonology    2. Mediastinal adenopathy  R59.0 NM PET Image Initial  (PI) Skull Base To Thigh    Procedural/ Surgical Case Request: VIDEO BRONCHOSCOPY WITH ENDOBRONCHIAL NAVIGATION, VIDEO BRONCHOSCOPY WITH ENDOBRONCHIAL ULTRASOUND    Ambulatory referral to Pulmonology      Discussion:  This is a 45 year old gentleman, past medical history of OSA on CPAP, obesity, lifelong non-smoker, secondhand smoke exposure from parents.  Father recently passed from small cell lung cancer.  Plan: We discussed today the risk benefits and alternatives of proceeding with navigational bronchoscopy and video bronchoscopy with endobronchial ultrasound and transbronchial needle biopsies. We reviewed his CT imaging today in the office.  We discussed the risk of bleeding and pneumothorax. I think we can planned a dual procedure in which we stage his mediastinum for the right-sided paratracheal node as well as going after the 10 mm right upper lobe pulmonary nodule.  Patient is agreeable to this plan.  We reviewed CT imaging today in the office. I think he also needs a PET scan.  It would be ideal if we were able to get the PET scan prior to the procedure.  But I do not want to delay his work-up for the abnormality as the lymph node within the chest has grown in size since last imaging.    Current Outpatient Medications:    aspirin EC 81 MG EC tablet, Take 1 tablet (81 mg total) by mouth daily., Disp: 150 tablet, Rfl: 3   eszopiclone (LUNESTA) 1 MG TABS tablet, 1-3 tabs po qhs. Take immediately before bedtime, Disp: 90 tablet, Rfl: 1   indomethacin (INDOCIN) 50 MG capsule, 1 tab po tid prn pain, Disp: 30 capsule, Rfl: 1   losartan (COZAAR) 50 MG tablet, Take 1 tablet (50 mg total) by mouth daily., Disp: 90 tablet, Rfl: 3  I spent 61 minutes dedicated to the care of this  patient on the date of this encounter to include pre-visit review of records, face-to-face time with the patient discussing conditions above, post visit ordering of testing, clinical documentation with the  electronic health record, making appropriate referrals as documented, and communicating necessary findings to members of the patients care team.   [name], DO [facility] 09/12/2020 9:59 AM

## 2020-09-12 NOTE — Patient Instructions (Signed)
Thank you for visiting Dr. Valeta Harms at [facility]. Today we recommend the following:  Orders Placed This Encounter  Procedures   Procedural/ Surgical Case Request: VIDEO BRONCHOSCOPY WITH ENDOBRONCHIAL NAVIGATION, VIDEO BRONCHOSCOPY WITH ENDOBRONCHIAL ULTRASOUND   NM PET Image Initial (PI) Skull Base To Thigh   Ambulatory referral to Pulmonology   Tentative date: 09/27/2020 - Bronchoscopy and biopsy   Return in about 4 weeks (around 10/10/2020) for w/ Dr. Valeta Harms .    Please do your part to reduce the spread of COVID-19.

## 2020-09-15 ENCOUNTER — Other Ambulatory Visit: Payer: Self-pay | Admitting: Cardiovascular Disease

## 2020-09-22 ENCOUNTER — Encounter (HOSPITAL_COMMUNITY): Payer: Self-pay | Admitting: Pulmonary Disease

## 2020-09-22 NOTE — Progress Notes (Signed)
DUE TO COVID-19 ONLY ONE VISITOR IS ALLOWED TO COME WITH YOU AND STAY IN THE WAITING ROOM ONLY DURING PRE OP AND PROCEDURE DAY OF SURGERY.   PCP - Dr Anitra Lauth Cardiologist - Dr Jenkins Rouge Pulmonology - Dr Sherrilyn Rist  CT Chest x-ray - 08/24/20 EKG - 07/31/19 Stress Test - 07/31/19 ECHO - 07/31/19 Cardiac Cath - n/a  Sleep Study -  Yes CPAP - uses cpap  Aspirin Instructions: Follow your surgeon's instructions on when to stop aspirin prior to surgery,  If no instructions were given by your surgeon then you will need to call the office for those instructions.  STOP now taking any Aspirin (unless otherwise instructed by your surgeon), Aleve, Naproxen, Ibuprofen, Motrin, Advil, Goody's, BC's, all herbal medications, fish oil, and all vitamins.   Coronavirus Screening Covid test on 09/23/20 per MD.  Do you have any of the following symptoms:  Cough yes/no: No Fever (>100.50F)  yes/no: No Runny nose yes/no: No Sore throat yes/no: No Difficulty breathing/shortness of breath  yes/no: No  Have you traveled in the last 14 days and where? yes/no: No  Patient verbalized understanding of instructions that were given via phone.

## 2020-09-23 ENCOUNTER — Other Ambulatory Visit (HOSPITAL_COMMUNITY)
Admission: RE | Admit: 2020-09-23 | Discharge: 2020-09-23 | Disposition: A | Discharge status: Home / Self Care | Payer: 59 | Source: Ambulatory Visit | Attending: Pulmonary Disease | Admitting: Pulmonary Disease

## 2020-09-23 DIAGNOSIS — Z20822 Contact with and (suspected) exposure to covid-19: Secondary | ICD-10-CM | POA: Diagnosis not present

## 2020-09-23 DIAGNOSIS — Z01812 Encounter for preprocedural laboratory examination: Secondary | ICD-10-CM | POA: Diagnosis not present

## 2020-09-23 LAB — SARS CORONAVIRUS 2 (TAT 6-24 HRS): SARS Coronavirus 2: NEGATIVE

## 2020-09-23 NOTE — Anesthesia Preprocedure Evaluation (Addendum)
Anesthesia Evaluation  Patient identified by MRN, date of birth, ID band Patient awake    Reviewed: Allergy & Precautions, NPO status , Patient's Chart, lab work & pertinent test results  History of Anesthesia Complications Negative for: history of anesthetic complications  Airway Mallampati: III  TM Distance: >3 FB Neck ROM: Full    Dental no notable dental hx. (+) Dental Advisory Given   Pulmonary asthma , sleep apnea ,    Pulmonary exam normal        Cardiovascular hypertension, Pt. on medications Normal cardiovascular exam  CT Cardiac Scoring 08/25/19:Coronary calcium score of 0.  Nuclear stress test 07/31/19: 1. No reversible ischemia or infarction. 2. Normal left ventricular wall motion. 3. Left ventricular ejection fraction 53% 4. Non invasive risk stratification*: Low  Echo 07/31/19: 1. Left ventricular ejection fraction, by estimation, is 60 to 65%. The  left ventricle has normal function. The left ventricle has no regional  wall motion abnormalities. Left ventricular diastolic parameters were  normal.  2. Right ventricular systolic function is normal. The right ventricular  size is normal. Tricuspid regurgitation signal is inadequate for assessing  PA pressure.  3. The mitral valve is normal in structure. No evidence of mitral valve  regurgitation. No evidence of mitral stenosis.  4. The aortic valve is normal in structure. Aortic valve regurgitation is  not visualized. No aortic stenosis is present.  5. The inferior vena cava is normal in size with <50% respiratory  variability, suggesting right atrial pressure of 8 mmHg.      Neuro/Psych negative neurological ROS     GI/Hepatic negative GI ROS, Neg liver ROS,   Endo/Other  negative endocrine ROS  Renal/GU negative Renal ROS     Musculoskeletal negative musculoskeletal ROS (+)   Abdominal   Peds  Hematology negative hematology ROS (+)    Anesthesia Other Findings   Reproductive/Obstetrics                          Anesthesia Physical Anesthesia Plan  ASA: 3  Anesthesia Plan: General   Post-op Pain Management:    Induction: Intravenous  PONV Risk Score and Plan: 2 and Ondansetron and Midazolam  Airway Management Planned: Oral ETT  Additional Equipment:   Intra-op Plan:   Post-operative Plan: Extubation in OR  Informed Consent: I have reviewed the patients History and Physical, chart, labs and discussed the procedure including the risks, benefits and alternatives for the proposed anesthesia with the patient or authorized representative who has indicated his/her understanding and acceptance.     Dental advisory given  Plan Discussed with: CRNA and Anesthesiologist  Anesthesia Plan Comments: (PAT note written 09/23/2020 by Myra Gianotti, PA-C. )       Anesthesia Quick Evaluation

## 2020-09-23 NOTE — Progress Notes (Signed)
Anesthesia Chart Review: SAME DAY WORK-UP   Case: [id number] Date/Time: 09/27/20 0915   Procedures:      VIDEO BRONCHOSCOPY WITH ENDOBRONCHIAL NAVIGATION (Right)     VIDEO BRONCHOSCOPY WITH ENDOBRONCHIAL ULTRASOUND   Anesthesia type: General   Diagnosis:      Nodule of upper lobe of right lung [R91.1]     Mediastinal adenopathy [R59.0]   Pre-op diagnosis: lung nodule, adenopathy   Location: [facility] ROOM 2 / [place] [facility]   Surgeons: [name], DO       DISCUSSION: Patient is a 45 year old male scheduled for the above procedure. He had coronary calcium scoring CT on 08/25/19 with incidental finding of bilateral pulmonary nodules, follow-up in 12 months if high risk. RUL nodules increased in size on 08/24/20 follow-up CT with 1.7 cm right paratracheal node. Referral to thoracic surgery or pulmonary medicine recommended. Seen by Dr. Valeta Harms on 09/12/20. The above procedure recommended as well as a PET scan.   History includes never smoker (second hand exposure from parents, father died from small cell lung cancer), HLD, HTN, OSA (uses CPAP), pulmonary nodules, asthma (mild, intermittent), chest pain (Normal Myoview 07/31/19, Coronary calcium score 0 08/25/19), obesity.  Last cardiology visit with Dr. Johnsie Cancel on 11/26/19. Reassuring cardiology evaluation in 2021. ARB increased for HTN. Continue statin. Follow-up in one year recommended.  09/23/20 preoperative COVID-19 test in process. Anesthesia team to evaluate on the day of surgery.   VS: Ht 6\' 1"  (1.854 m)   Wt (!) 136.5 kg   BMI 39.71 kg/m  BP Readings from Last 3 Encounters:  09/12/20 120/90  01/04/20 133/82  11/26/19 130/80   Pulse Readings from Last 3 Encounters:  09/12/20 65  01/04/20 79  11/26/19 (!) 59     PROVIDERS: [place]owen, Adrian Blackwater, MD is PCP  Jenkins Rouge, MD is cardiologist Sherrilyn Rist, MD is pulmonologist   LABS: For day of surgery as indicated. Last available labs > 66 year old - as of 09/04/19, Cr 1.10, LFTs  normal. CBC normal 07/31/19.    IMAGES: CT Chest 08/24/20: IMPRESSION: 1. There is a 1 cm nodule within the periphery of the right upper lobe (this area was not imaged on the previous coronary calcium score exam). 6 mm nodule within the superior segment of the right lower lobe has increased from 3 mm previously. Additionally, there is an enlarged lymph node within the right paratracheal region measuring 1.7 cm. Recommend referral to thoracic surgery or pulmonary medicine for further management. 2. Previously noted 5 mm nodule in the superior segment of right lower lobe is stable from the previous exam and is presumably benign. 3. Aortic atherosclerosis.     EKG: 07/31/19: SR, poor r wave progression. Baseline wanderer in I, II, III.    CV: CT Cardiac Scoring 08/25/19: IMPRESSION: Coronary calcium score of 0. See radiology note regarding dysmotility and lung nodules    Nuclear stress test 07/31/19: IMPRESSION: 1. No reversible ischemia or infarction. 2. Normal left ventricular wall motion. 3. Left ventricular ejection fraction 53% 4. Non invasive risk stratification*: Low    Echo 07/31/19: IMPRESSIONS   1. Left ventricular ejection fraction, by estimation, is 60 to 65%. The  left ventricle has normal function. The left ventricle has no regional  wall motion abnormalities. Left ventricular diastolic parameters were  normal.   2. Right ventricular systolic function is normal. The right ventricular  size is normal. Tricuspid regurgitation signal is inadequate for assessing  PA pressure.   3. The mitral  valve is normal in structure. No evidence of mitral valve  regurgitation. No evidence of mitral stenosis.   4. The aortic valve is normal in structure. Aortic valve regurgitation is  not visualized. No aortic stenosis is present.   5. The inferior vena cava is normal in size with <50% respiratory  variability, suggesting right atrial pressure of 8 mmHg.    Past Medical  History:  Diagnosis Date   Chest pain 07/2019   ACS ruled out. Stress test and echo normal.  BP was out of control at the time of his eval/hosp.   Esophageal abnormality 08/2019   mildly dilated distal esophagus + food debris in esoph noted on coronary CT calcium scoring imaging--refer to GI for eval for possible esoph dysmotility--pt declined this as of 08/2019.   Essential hypertension    Gout    toes   Hyperlipidemia    statin started at the time of CP admission 07/2019-rosuva   Hyperlipidemia    diet controlled   Insomnia    lunesta helpful   Mild intermittent asthma    activity induced, typically.  Allergies trigger as a kid.   Obesity, Class II, BMI 35-39.9    OSA on CPAP    [place] [facility]- uses cpap nightly   Plantar fasciitis 01/2019   steroid inj, Dr. Paulla Dolly   Pulmonary nodules 08/2019   incidentally noted on coronary calcium scoring imaging-> cards to arrange 1 yr f/u CT    Past Surgical History:  Procedure Laterality Date   APPENDECTOMY  1989   CARDIOVASCULAR STRESS TEST  07/31/2019   NO ISCHEMIA   coronary ct  08/2019   calcium score ZERO.     TONSILLECTOMY AND ADENOIDECTOMY  1979   TRANSTHORACIC ECHOCARDIOGRAM  07/31/2019   normal   WISDOM TOOTH EXTRACTION      MEDICATIONS: No current facility-administered medications for this encounter.    albuterol (VENTOLIN HFA) 108 (90 Base) MCG/ACT inhaler   aspirin EC 81 MG EC tablet   eszopiclone (LUNESTA) 1 MG TABS tablet   glucosamine-chondroitin 500-400 MG tablet   indomethacin (INDOCIN) 50 MG capsule   losartan (COZAAR) 50 MG tablet   Omega-3 Fatty Acids (OMEGA-3 FISH OIL) 500 MG CAPS    Myra Gianotti, PA-C Surgical Short Stay/Anesthesiology [facility] Phone [phone] [facility] Phone [phone] 09/23/2020 2:54 PM

## 2020-09-27 ENCOUNTER — Ambulatory Visit (HOSPITAL_COMMUNITY): Payer: 59 | Admitting: Vascular Surgery

## 2020-09-27 ENCOUNTER — Encounter (HOSPITAL_COMMUNITY): Payer: Self-pay | Admitting: Pulmonary Disease

## 2020-09-27 ENCOUNTER — Other Ambulatory Visit: Payer: Self-pay

## 2020-09-27 ENCOUNTER — Ambulatory Visit (HOSPITAL_COMMUNITY): Payer: 59

## 2020-09-27 ENCOUNTER — Ambulatory Visit (HOSPITAL_COMMUNITY)
Admission: RE | Admit: 2020-09-27 | Discharge: 2020-09-27 | Disposition: A | Discharge status: Home / Self Care | Payer: 59 | Attending: Pulmonary Disease | Admitting: Pulmonary Disease

## 2020-09-27 ENCOUNTER — Encounter (HOSPITAL_COMMUNITY)
Admission: RE | Disposition: A | Discharge status: Home / Self Care | Payer: Self-pay | Source: Home / Self Care | Attending: Pulmonary Disease

## 2020-09-27 DIAGNOSIS — Z8249 Family history of ischemic heart disease and other diseases of the circulatory system: Secondary | ICD-10-CM | POA: Diagnosis not present

## 2020-09-27 DIAGNOSIS — Z6839 Body mass index (BMI) 39.0-39.9, adult: Secondary | ICD-10-CM | POA: Diagnosis not present

## 2020-09-27 DIAGNOSIS — I10 Essential (primary) hypertension: Secondary | ICD-10-CM | POA: Insufficient documentation

## 2020-09-27 DIAGNOSIS — Z9889 Other specified postprocedural states: Secondary | ICD-10-CM

## 2020-09-27 DIAGNOSIS — R911 Solitary pulmonary nodule: Secondary | ICD-10-CM | POA: Diagnosis not present

## 2020-09-27 DIAGNOSIS — Z7982 Long term (current) use of aspirin: Secondary | ICD-10-CM | POA: Diagnosis not present

## 2020-09-27 DIAGNOSIS — G4733 Obstructive sleep apnea (adult) (pediatric): Secondary | ICD-10-CM | POA: Insufficient documentation

## 2020-09-27 DIAGNOSIS — Z8261 Family history of arthritis: Secondary | ICD-10-CM | POA: Diagnosis not present

## 2020-09-27 DIAGNOSIS — J4520 Mild intermittent asthma, uncomplicated: Secondary | ICD-10-CM | POA: Diagnosis not present

## 2020-09-27 DIAGNOSIS — E785 Hyperlipidemia, unspecified: Secondary | ICD-10-CM | POA: Diagnosis not present

## 2020-09-27 DIAGNOSIS — Z8349 Family history of other endocrine, nutritional and metabolic diseases: Secondary | ICD-10-CM | POA: Diagnosis not present

## 2020-09-27 DIAGNOSIS — R590 Localized enlarged lymph nodes: Secondary | ICD-10-CM | POA: Diagnosis present

## 2020-09-27 DIAGNOSIS — Z79899 Other long term (current) drug therapy: Secondary | ICD-10-CM | POA: Diagnosis not present

## 2020-09-27 DIAGNOSIS — E669 Obesity, unspecified: Secondary | ICD-10-CM | POA: Diagnosis not present

## 2020-09-27 DIAGNOSIS — Z419 Encounter for procedure for purposes other than remedying health state, unspecified: Secondary | ICD-10-CM

## 2020-09-27 HISTORY — PX: VIDEO BRONCHOSCOPY WITH ENDOBRONCHIAL NAVIGATION: SHX6175

## 2020-09-27 HISTORY — PX: BRONCHIAL WASHINGS: SHX5105

## 2020-09-27 HISTORY — PX: BRONCHIAL BRUSHINGS: SHX5108

## 2020-09-27 HISTORY — PX: VIDEO BRONCHOSCOPY WITH ENDOBRONCHIAL ULTRASOUND: SHX6177

## 2020-09-27 HISTORY — PX: BRONCHIAL NEEDLE ASPIRATION BIOPSY: SHX5106

## 2020-09-27 LAB — POCT I-STAT, CHEM 8
BUN: 17 mg/dL (ref 6–20)
Calcium, Ion: 1.18 mmol/L (ref 1.15–1.40)
Chloride: 105 mmol/L (ref 98–111)
Creatinine, Ser: 0.9 mg/dL (ref 0.61–1.24)
Glucose, Bld: 106 mg/dL — ABNORMAL HIGH (ref 70–99)
HCT: 46 % (ref 39.0–52.0)
Hemoglobin: 15.6 g/dL (ref 13.0–17.0)
Potassium: 4.2 mmol/L (ref 3.5–5.1)
Sodium: 140 mmol/L (ref 135–145)
TCO2: 24 mmol/L (ref 22–32)

## 2020-09-27 SURGERY — VIDEO BRONCHOSCOPY WITH ENDOBRONCHIAL NAVIGATION
Anesthesia: General | Laterality: Right

## 2020-09-27 MED ORDER — GLYCOPYRROLATE PF 0.2 MG/ML IJ SOSY
PREFILLED_SYRINGE | INTRAMUSCULAR | Status: DC | PRN
  Administered 2020-09-27 (×2): .1 mg via INTRAVENOUS

## 2020-09-27 MED ORDER — LACTATED RINGERS IV SOLN: INTRAVENOUS | Status: DC

## 2020-09-27 MED ORDER — SUGAMMADEX SODIUM 200 MG/2ML IV SOLN
INTRAVENOUS | Status: DC | PRN
  Administered 2020-09-27: 200 mg via INTRAVENOUS

## 2020-09-27 MED ORDER — PHENYLEPHRINE 40 MCG/ML (10ML) SYRINGE FOR IV PUSH (FOR BLOOD PRESSURE SUPPORT)
PREFILLED_SYRINGE | INTRAVENOUS | Status: DC | PRN
  Administered 2020-09-27: 80 ug via INTRAVENOUS

## 2020-09-27 MED ORDER — ONDANSETRON HCL 4 MG/2ML IJ SOLN
INTRAMUSCULAR | Status: DC | PRN
  Administered 2020-09-27: 4 mg via INTRAVENOUS

## 2020-09-27 MED ORDER — PROPOFOL 10 MG/ML IV BOLUS
INTRAVENOUS | Status: DC | PRN
  Administered 2020-09-27: 200 mg via INTRAVENOUS

## 2020-09-27 MED ORDER — ROCURONIUM BROMIDE 10 MG/ML (PF) SYRINGE
PREFILLED_SYRINGE | INTRAVENOUS | Status: DC | PRN
  Administered 2020-09-27: 10 mg via INTRAVENOUS
  Administered 2020-09-27: 50 mg via INTRAVENOUS

## 2020-09-27 MED ORDER — DEXAMETHASONE SODIUM PHOSPHATE 10 MG/ML IJ SOLN
INTRAMUSCULAR | Status: DC | PRN
  Administered 2020-09-27: 10 mg via INTRAVENOUS

## 2020-09-27 MED ORDER — SUCCINYLCHOLINE CHLORIDE 200 MG/10ML IV SOSY
PREFILLED_SYRINGE | INTRAVENOUS | Status: DC | PRN
  Administered 2020-09-27: 160 mg via INTRAVENOUS

## 2020-09-27 MED ORDER — LIDOCAINE 2% (20 MG/ML) 5 ML SYRINGE
INTRAMUSCULAR | Status: DC | PRN
  Administered 2020-09-27: 60 mg via INTRAVENOUS

## 2020-09-27 SURGICAL SUPPLY — 51 items
ADAPTER BRONCH F/PENTAX (ADAPTER) ×4 IMPLANT
ADAPTER VALVE BIOPSY EBUS (MISCELLANEOUS) IMPLANT
ADPR BSCP EDG PNTX (ADAPTER) ×3
ADPTR VALVE BIOPSY EBUS (MISCELLANEOUS)
BRUSH CYTOL CELLEBRITY 1.5X140 (MISCELLANEOUS) ×4 IMPLANT
BRUSH SUPERTRAX BIOPSY (INSTRUMENTS) IMPLANT
BRUSH SUPERTRAX NDL-TIP CYTO (INSTRUMENTS) ×4 IMPLANT
CANISTER SUCT 3000ML PPV (MISCELLANEOUS) ×4 IMPLANT
CHANNEL WORK EXTEND EDGE 180 (KITS) IMPLANT
CHANNEL WORK EXTEND EDGE 45 (KITS) IMPLANT
CHANNEL WORK EXTEND EDGE 90 (KITS) IMPLANT
CONT SPEC 4OZ CLIKSEAL STRL BL (MISCELLANEOUS) ×4 IMPLANT
COVER BACK TABLE 60X90IN (DRAPES) ×4 IMPLANT
COVER DOME SNAP 22 D (MISCELLANEOUS) ×4 IMPLANT
FILTER STRAW FLUID ASPIR (MISCELLANEOUS) IMPLANT
FORCEPS BIOP RJ4 1.8 (CUTTING FORCEPS) IMPLANT
FORCEPS BIOP SUPERTRX PREMAR (INSTRUMENTS) ×4 IMPLANT
GAUZE SPONGE 4X4 12PLY STRL (GAUZE/BANDAGES/DRESSINGS) ×4 IMPLANT
GLOVE BIO SURGEON STRL SZ7.5 (GLOVE) ×4 IMPLANT
GLOVE SURG SS PI 7.5 STRL IVOR (GLOVE) ×8 IMPLANT
GOWN STRL REUS W/ TWL LRG LVL3 (GOWN DISPOSABLE) ×6 IMPLANT
GOWN STRL REUS W/TWL LRG LVL3 (GOWN DISPOSABLE) ×8
KIT CLEAN ENDO COMPLIANCE (KITS) ×8 IMPLANT
KIT LOCATABLE GUIDE (CANNULA) IMPLANT
KIT MARKER FIDUCIAL DELIVERY (KITS) IMPLANT
KIT PROCEDURE EDGE 180 (KITS) IMPLANT
KIT PROCEDURE EDGE 45 (KITS) IMPLANT
KIT PROCEDURE EDGE 90 (KITS) IMPLANT
KIT TURNOVER KIT B (KITS) ×4 IMPLANT
MARKER SKIN DUAL TIP RULER LAB (MISCELLANEOUS) ×4 IMPLANT
NEEDLE EBUS SONO TIP PENTAX (NEEDLE) ×4 IMPLANT
NEEDLE SUPERTRX PREMARK BIOPSY (NEEDLE) ×4 IMPLANT
NS IRRIG 1000ML POUR BTL (IV SOLUTION) ×4 IMPLANT
OIL SILICONE PENTAX (PARTS (SERVICE/REPAIRS)) ×4 IMPLANT
PAD ARMBOARD 7.5X6 YLW CONV (MISCELLANEOUS) ×8 IMPLANT
PATCHES PATIENT (LABEL) ×12 IMPLANT
SOL ANTI FOG 6CC (MISCELLANEOUS) ×3 IMPLANT
SOLUTION ANTI FOG 6CC (MISCELLANEOUS) ×1
SYR 20CC LL (SYRINGE) ×8 IMPLANT
SYR 20ML ECCENTRIC (SYRINGE) ×8 IMPLANT
SYR 50ML SLIP (SYRINGE) ×4 IMPLANT
SYR 5ML LUER SLIP (SYRINGE) ×4 IMPLANT
TOWEL OR 17X24 6PK STRL BLUE (TOWEL DISPOSABLE) ×4 IMPLANT
TRAP SPECIMEN MUCOUS 40CC (MISCELLANEOUS) IMPLANT
TUBE CONNECTING 20X1/4 (TUBING) ×8 IMPLANT
UNDERPAD 30X30 (UNDERPADS AND DIAPERS) ×4 IMPLANT
VALVE BIOPSY  SINGLE USE (MISCELLANEOUS) ×1
VALVE BIOPSY SINGLE USE (MISCELLANEOUS) ×3 IMPLANT
VALVE DISPOSABLE (MISCELLANEOUS) ×4 IMPLANT
VALVE SUCTION BRONCHIO DISP (MISCELLANEOUS) ×4 IMPLANT
WATER STERILE IRR 1000ML POUR (IV SOLUTION) ×4 IMPLANT

## 2020-09-27 NOTE — Anesthesia Postprocedure Evaluation (Signed)
Anesthesia Post Note  Patient: Cody Cruz  Procedure(s) Performed: VIDEO BRONCHOSCOPY WITH ENDOBRONCHIAL NAVIGATION (Right) VIDEO BRONCHOSCOPY WITH ENDOBRONCHIAL ULTRASOUND BRONCHIAL BRUSHINGS BRONCHIAL NEEDLE ASPIRATION BIOPSIES BRONCHIAL WASHINGS     Patient location during evaluation: PACU Anesthesia Type: General Level of consciousness: sedated Pain management: pain level controlled Vital Signs Assessment: post-procedure vital signs reviewed and stable Respiratory status: spontaneous breathing and respiratory function stable Cardiovascular status: stable Postop Assessment: no apparent nausea or vomiting Anesthetic complications: no   No notable events documented.  Last Vitals:  Vitals:   09/27/20 1023 09/27/20 1038  BP: 111/65 119/74  Pulse:  60  Resp:  13  Temp:  36.8 C  SpO2:  94%    Last Pain:  Vitals:   09/27/20 1038  TempSrc:   PainSc: 5                  [name] DANIEL

## 2020-09-27 NOTE — Anesthesia Procedure Notes (Signed)
Procedure Name: Intubation Date/Time: 09/27/2020 8:16 AM Performed by: Georgia Duff, CRNA Pre-anesthesia Checklist: Patient identified, Emergency Drugs available, Suction available and Patient being monitored Patient Re-evaluated:Patient Re-evaluated prior to induction Oxygen Delivery Method: Circle System Utilized Preoxygenation: Pre-oxygenation with 100% oxygen Induction Type: IV induction Ventilation: Mask ventilation without difficulty Tube type: Oral Tube size: 8.5 mm Number of attempts: 1 Airway Equipment and Method: Stylet and Oral airway Placement Confirmation: ETT inserted through vocal cords under direct vision, positive ETCO2 and breath sounds checked- equal and bilateral Secured at: 25 cm Tube secured with: Tape Dental Injury: Teeth and Oropharynx as per pre-operative assessment

## 2020-09-27 NOTE — Discharge Instructions (Signed)
Flexible Bronchoscopy, Care After This sheet gives you information about how to care for yourself after your test. Your doctor may also give you more specific instructions. If you have problems or questions, contact your doctor. Follow these instructions at home: Eating and drinking Do not eat or drink anything (not even water) for 2 hours after your test, or until your numbing medicine (local anesthetic) wears off. When your numbness is gone and your cough and gag reflexes have come back, you may: Eat only soft foods. Slowly drink liquids. The day after the test, go back to your normal diet. Driving Do not drive for 24 hours if you were given a medicine to help you relax (sedative). Do not drive or use heavy machinery while taking prescription pain medicine. General instructions  Take over-the-counter and prescription medicines only as told by your doctor. Return to your normal activities as told. Ask what activities are safe for you. Do not use any products that have nicotine or tobacco in them. This includes cigarettes and e-cigarettes. If you need help quitting, ask your doctor. Keep all follow-up visits as told by your doctor. This is important. It is very important if you had a tissue sample (biopsy) taken. Get help right away if: You have shortness of breath that gets worse. You get light-headed. You feel like you are going to pass out (faint). You have chest pain. You cough up: More than a little blood. More blood than before. Summary Do not eat or drink anything (not even water) for 2 hours after your test, or until your numbing medicine wears off. Do not use cigarettes. Do not use e-cigarettes. Get help right away if you have chest pain.  This information is not intended to replace advice given to you by your health care provider. Make sure you discuss any questions you have with your health care provider. Document Released: 12/31/2008 Document Revised: 02/15/2017 Document  Reviewed: 03/23/2016 Elsevier Patient Education  2020 [employer].

## 2020-09-27 NOTE — Transfer of Care (Signed)
Immediate Anesthesia Transfer of Care Note  Patient: Cody Cruz  Procedure(s) Performed: VIDEO BRONCHOSCOPY WITH ENDOBRONCHIAL NAVIGATION (Right) VIDEO BRONCHOSCOPY WITH ENDOBRONCHIAL ULTRASOUND BRONCHIAL BRUSHINGS BRONCHIAL NEEDLE ASPIRATION BIOPSIES BRONCHIAL WASHINGS  Patient Location: PACU  Anesthesia Type:General  Level of Consciousness: awake, alert  and patient cooperative  Airway & Oxygen Therapy: Patient Spontanous Breathing  Post-op Assessment: Report given to RN and Post -op Vital signs reviewed and stable  Post vital signs: Reviewed and stable  Last Vitals:  Vitals Value Taken Time  BP 124/78 09/27/20 0953  Temp 36.6 C 09/27/20 0953  Pulse 62 09/27/20 0959  Resp 9 09/27/20 0959  SpO2 89 % 09/27/20 0959  Vitals shown include unvalidated device data.  Last Pain:  Vitals:   09/27/20 0953  TempSrc:   PainSc: 5       Patients Stated Pain Goal: 3 (90/30/09 2330)  Complications: No notable events documented.

## 2020-09-27 NOTE — Interval H&P Note (Signed)
History and Physical Interval Note:  09/27/2020 7:55 AM  Cody Cruz  has presented today for surgery, with the diagnosis of lung nodule, adenopathy.  The various methods of treatment have been discussed with the patient and family. After consideration of risks, benefits and other options for treatment, the patient has consented to  Procedure(s): VIDEO BRONCHOSCOPY WITH ENDOBRONCHIAL NAVIGATION (Right) VIDEO BRONCHOSCOPY WITH ENDOBRONCHIAL ULTRASOUND (N/A) as a surgical intervention.  The patient's history has been reviewed, patient examined, no change in status, stable for surgery.  I have reviewed the patient's chart and labs.  Questions were answered to the patient's satisfaction.     [place]

## 2020-09-27 NOTE — Op Note (Signed)
Video Bronchoscopy with Electromagnetic Navigation Procedure Note Video Bronchoscopy with Endobronchial Ultrasound Procedure Note  Date of Operation: 09/27/2020  Pre-op Diagnosis: Lung nodule, adenopathy   Post-op Diagnosis: Lung nodule, adenopathy   Surgeon: [name], DO  Assistants: None   Anesthesia: General endotracheal anesthesia  Operation: Flexible video fiberoptic bronchoscopy with electromagnetic navigation and biopsies.  Estimated Blood Loss: Minimal  Complications: None   Indications and History: Cody Cruz is a 45 y.o. male with lung nodule, adenopathy.  The risks, benefits, complications, treatment options and expected outcomes were discussed with the patient.  The possibilities of pneumothorax, pneumonia, reaction to medication, pulmonary aspiration, perforation of a viscus, bleeding, failure to diagnose a condition and creating a complication requiring transfusion or operation were discussed with the patient who freely signed the consent.    Description of Procedure: The patient was seen in the Preoperative Area, was examined and was deemed appropriate to proceed.  The patient was taken to [facility] endoscopy room 2, identified as [facility] Human and the procedure verified as Flexible Video Fiberoptic Bronchoscopy.  A Time Out was held and the above information confirmed.   Prior to the date of the procedure a high-resolution CT scan of the chest was performed. Utilizing [place] a virtual tracheobronchial tree was generated to allow the creation of distinct navigation pathways to the patient's parenchymal abnormalities. After being taken to the operating room general anesthesia was initiated and the patient  was orally intubated. The video fiberoptic bronchoscope was introduced via the endotracheal tube and a general inspection was performed which showed a normal left lung anatomy, right lung anatomy reveals an accessory right upper lobe takeoff, 2  divisions within a right upper lobe, normal right middle lobe and lower lobe anatomy. The extendable working channel and locator guide were introduced into the bronchoscope. The distinct navigation pathways prepared prior to this procedure were then utilized to navigate to within 0.6 cm of patient's lesion(s) identified on CT scan.  Full fluoroscopic sleep was obtained with inspiratory breath-hold with APL of 20 cm water. The extendable working channel was secured into place and the locator guide was withdrawn. Under fluoroscopic guidance transbronchial needle brushings, transbronchial Wang needle biopsies, and transbronchial forceps biopsies were performed to be sent for cytology and pathology. A bronchioalveolar lavage was performed in the right upper lobe and sent for cytology.    Endobronchial ultrasound procedure:  The standard scope was then withdrawn and the endobronchial ultrasound was used to identify and characterize the peritracheal, hilar and bronchial lymph nodes. Inspection showed normal-appearing anatomy, enlarged station 4R node. Using real-time ultrasound guidance Wang needle biopsies were take from Station 4R nodes and were sent for cytology. The patient tolerated the procedure well without apparent complications.   Standard therapeutic bronchoscope was inserted into the patient's airway, aspiration of the bilateral mainstem was necessary for removal of any remaining blood clots and debris. At the end of the procedure a general airway inspection was performed and there was no evidence of active bleeding. The bronchoscope was removed.  The patient tolerated the procedure well. There was no significant blood loss and there were no obvious complications. A post-procedural chest x-ray is pending.  Target #1 right upper lobe nodule samples: 1. Transbronchial needle brushings from right upper lobe 2. Transbronchial Wang needle biopsies from right upper lobe 3. Transbronchial forceps biopsies  from right upper lobe 4. Bronchoalveolar lavage from right upper lobe  Target #2 Station 4R samples: 1. Wang needle biopsies from 4R node  Plans:  The patient will be discharged from the PACU to home when recovered from anesthesia and after chest x-ray is reviewed. We will review the cytology, pathology and microbiology results with the patient when they become available. Outpatient followup will be with Leory Plowman L Keena Heesch,DO.    [name], DO [facility] 09/27/2020 9:56 AM

## 2020-09-28 ENCOUNTER — Ambulatory Visit (HOSPITAL_COMMUNITY)
Admission: RE | Admit: 2020-09-28 | Discharge: 2020-09-28 | Disposition: A | Discharge status: Home / Self Care | Payer: 59 | Source: Ambulatory Visit | Attending: Pulmonary Disease | Admitting: Pulmonary Disease

## 2020-09-28 ENCOUNTER — Encounter (HOSPITAL_COMMUNITY): Payer: Self-pay | Admitting: Pulmonary Disease

## 2020-09-28 DIAGNOSIS — R590 Localized enlarged lymph nodes: Secondary | ICD-10-CM

## 2020-09-28 DIAGNOSIS — R911 Solitary pulmonary nodule: Secondary | ICD-10-CM | POA: Insufficient documentation

## 2020-09-28 LAB — ACID FAST SMEAR (AFB, MYCOBACTERIA): Acid Fast Smear: NEGATIVE

## 2020-09-28 LAB — GLUCOSE, CAPILLARY: Glucose-Capillary: 127 mg/dL — ABNORMAL HIGH (ref 70–99)

## 2020-09-28 MED ORDER — FLUDEOXYGLUCOSE F - 18 (FDG) INJECTION
15.0600 | Freq: Once | INTRAVENOUS | Status: AC
  Administered 2020-09-28: 15.06 via INTRAVENOUS

## 2020-09-29 ENCOUNTER — Telehealth: Payer: Self-pay | Admitting: Pulmonary Disease

## 2020-09-29 ENCOUNTER — Encounter: Payer: Self-pay | Admitting: Family Medicine

## 2020-09-29 DIAGNOSIS — R590 Localized enlarged lymph nodes: Secondary | ICD-10-CM

## 2020-09-29 LAB — CULTURE, BAL-QUANTITATIVE W GRAM STAIN
Culture: NO GROWTH
Gram Stain: NONE SEEN

## 2020-09-29 LAB — CYTOLOGY - NON PAP

## 2020-09-29 NOTE — Telephone Encounter (Signed)
PCCM:  Called and spoke with patient regarding recent nuclear medicine pet imaging as well as bronchoscopy results.  Atypical cells from the right upper lobe nodule specimen.  4R lymph node was also negative for malignancy.  Nuclear medicine PET scan was completed today which reveals a elevated hypermetabolic SUV in the right paratracheal node concerning for possible malignancy.  I suspect this is falsely positive after having endobronchial ultrasound and transbronchial needle aspirations of this lesion.  The path results are negative for malignancy with scant lymphoid tissue.  I recommend that he has at least short-term follow-up, repeat CT scan of the chest in 8 weeks to ensure that lymph node is not growing.  Patient is agreeable to this plan.  [facility] 09/29/2020 5:01 PM

## 2020-09-29 NOTE — Telephone Encounter (Signed)
Bronch performed on 7/12. Will close encounter.

## 2020-09-30 NOTE — Telephone Encounter (Signed)
Called patient, he is aware that we will cancel the OV on 07/28.   Nothing further needed.

## 2020-09-30 NOTE — Addendum Note (Signed)
Addended by: Valerie Salts on: 09/30/2020 10:56 AM   Modules accepted: Orders

## 2020-09-30 NOTE — Telephone Encounter (Signed)
Called and spoke with patient. He verbalized understanding. I have placed the order for the CT chest with contrast. He is aware that once the CT has been scheduled, we will be able to schedule his F/U appt.   He has an appt with BI on 10/13/20. I advised him that I would send a message over to BI to be sure that this appt can be cancelled, he verbalized understanding.   BI, can you please advise about the 10/13/20 appt? Thanks!

## 2020-10-02 LAB — ANAEROBIC CULTURE W GRAM STAIN

## 2020-10-11 ENCOUNTER — Other Ambulatory Visit: Payer: 59

## 2020-10-13 ENCOUNTER — Ambulatory Visit: Payer: 59 | Admitting: Pulmonary Disease

## 2020-10-18 LAB — CULTURE, FUNGUS WITHOUT SMEAR

## 2020-11-09 LAB — ACID FAST CULTURE WITH REFLEXED SENSITIVITIES (MYCOBACTERIA): Acid Fast Culture: NEGATIVE

## 2020-11-11 ENCOUNTER — Encounter: Payer: Self-pay | Admitting: Family Medicine

## 2020-11-11 ENCOUNTER — Ambulatory Visit (INDEPENDENT_AMBULATORY_CARE_PROVIDER_SITE_OTHER): Payer: 59 | Admitting: Family Medicine

## 2020-11-11 ENCOUNTER — Other Ambulatory Visit: Payer: Self-pay

## 2020-11-11 VITALS — BP 114/73 | HR 59 | Temp 97.7°F | Resp 16 | Ht 72.75 in | Wt 304.4 lb

## 2020-11-11 DIAGNOSIS — Z1211 Encounter for screening for malignant neoplasm of colon: Secondary | ICD-10-CM | POA: Diagnosis not present

## 2020-11-11 DIAGNOSIS — F5101 Primary insomnia: Secondary | ICD-10-CM

## 2020-11-11 DIAGNOSIS — E7800 Pure hypercholesterolemia, unspecified: Secondary | ICD-10-CM

## 2020-11-11 DIAGNOSIS — Z789 Other specified health status: Secondary | ICD-10-CM

## 2020-11-11 DIAGNOSIS — R7301 Impaired fasting glucose: Secondary | ICD-10-CM

## 2020-11-11 DIAGNOSIS — Z0000 Encounter for general adult medical examination without abnormal findings: Secondary | ICD-10-CM

## 2020-11-11 DIAGNOSIS — I10 Essential (primary) hypertension: Secondary | ICD-10-CM

## 2020-11-11 LAB — COMPREHENSIVE METABOLIC PANEL
ALT: 39 U/L (ref 0–53)
AST: 22 U/L (ref 0–37)
Albumin: 4.4 g/dL (ref 3.5–5.2)
Alkaline Phosphatase: 71 U/L (ref 39–117)
BUN: 12 mg/dL (ref 6–23)
CO2: 22 mEq/L (ref 19–32)
Calcium: 9.3 mg/dL (ref 8.4–10.5)
Chloride: 105 mEq/L (ref 96–112)
Creatinine, Ser: 1.09 mg/dL (ref 0.40–1.50)
GFR: 80.97 mL/min (ref >60.00)
Glucose, Bld: 95 mg/dL (ref 70–99)
Potassium: 4.2 mEq/L (ref 3.5–5.1)
Sodium: 138 mEq/L (ref 135–145)
Total Bilirubin: 0.6 mg/dL (ref 0.2–1.2)
Total Protein: 6.9 g/dL (ref 6.0–8.3)

## 2020-11-11 LAB — LIPID PANEL
Cholesterol: 258 mg/dL — ABNORMAL HIGH (ref 0–200)
HDL: 41.8 mg/dL (ref >39.00)
LDL Cholesterol: 182 mg/dL — ABNORMAL HIGH (ref 0–99)
NonHDL: 215.88
Total CHOL/HDL Ratio: 6
Triglycerides: 167 mg/dL — ABNORMAL HIGH (ref 0.0–149.0)
VLDL: 33.4 mg/dL (ref 0.0–40.0)

## 2020-11-11 LAB — TSH: TSH: 2.38 u[IU]/mL (ref 0.35–5.50)

## 2020-11-11 LAB — HEMOGLOBIN A1C: Hgb A1c MFr Bld: 5.5 % (ref 4.6–6.5)

## 2020-11-11 MED ORDER — ESZOPICLONE 2 MG PO TABS: 2.0000 mg | ORAL_TABLET | Freq: Every evening | ORAL | 1 refills | Status: DC | PRN

## 2020-11-11 NOTE — Progress Notes (Signed)
Office Note 11/11/2020  CC: No chief complaint on file.  HPI:  Cody Cruz is a 45 y.o. White male who is here for annual health maintenance exam and f/u HTN, HLD, and insomnia. A/P as of last visit on 01/04/20: "1) Joint pain in toes: still feel like gout is likely etiology. Some improvement since getting off rosuva but I'm not entirely convinced that this med can be clearly implicated as the cause of his gout.  He made some dietary adjustments since last visit as well. Nevertheless, he is apprehensive about restart of rosuva to see if severe/sharp episodes return, but he is open to trial of different statin.  Atorvastatin '20mg'$  qd rx'd today.   2) HTN: well controlled on losartan '50mg'$  qd.  Continue. Lytes/cr good 4 mo ago.   3) Hypercholesterolemia: needs statin if he can tolerate it.  See #1 above: trial of atorva '20mg'$  qd started today.   4) Insomnia: will try rx'ing lunesta '1mg'$ , 1-3 tabs po qhs, #90, RF x1. If he does end up getting this med then we'll get [place] at next f/u visit in 2 mo."  INTERIM HX: Feeling well. Working for same company, less stressful job.  He got a bronchoscopy 09/27/20 by Dr. Valeta Harms -Lymph node->benign. Has rpt CT chest with contrast next week.  HTN: occ home bp check <130/80.  HLD: not on statin b/c in the past it seemed to induced gout. Occ still has some toes pain, only occ swelling though. Occ use of indocin.    Insomnia: lunesta TWO of the '1mg'$  tabs helps well, ran out 3 mo ago.  PMP AWARE reviewed today: most recent rx for lunesta was filled 06/16/20, # 28, rx by me. No red flags.   Past Medical History:  Diagnosis Date   Chest pain 07/2019   ACS ruled out. Stress test and echo normal.  BP was out of control at the time of his eval/hosp.   Esophageal abnormality 08/2019   mildly dilated distal esophagus + food debris in esoph noted on coronary CT calcium scoring imaging--refer to GI for eval for possible esoph dysmotility--pt declined this  as of 08/2019.   Essential hypertension    Gout    toes   Hyperlipidemia    statin started at the time of CP admission 07/2019-rosuva   Hyperlipidemia    diet controlled   Insomnia    lunesta helpful   Mild intermittent asthma    activity induced, typically.  Allergies trigger as a kid.   Obesity, Class II, BMI 35-39.9    OSA on CPAP    [facility]- uses cpap nightly   Plantar fasciitis 01/2019   steroid inj, Dr. Paulla Dolly   Pulmonary nodules 08/2019   incidentally noted on coronary calcium scoring imaging-> enlarging nodule on 1 yr f/u imaging->pulm eval->LN bx no malignant cells, bronch washings/lavage with some atypical cells favored to be reactive, no malignant cells.    Past Surgical History:  Procedure Laterality Date   APPENDECTOMY  1989   BRONCHIAL BRUSHINGS  09/27/2020   Procedure: BRONCHIAL BRUSHINGS;  Surgeon: [name], DO;  Location: [place];  Service: Pulmonary;;   BRONCHIAL NEEDLE ASPIRATION BIOPSY  09/27/2020   Procedure: BRONCHIAL NEEDLE ASPIRATION BIOPSIES;  Surgeon: [name], DO;  Location: [place];  Service: Pulmonary;;   BRONCHIAL WASHINGS  09/27/2020   Procedure: BRONCHIAL WASHINGS;  Surgeon: [name], DO;  Location: [place];  Service: Pulmonary;;   CARDIOVASCULAR STRESS TEST  07/31/2019  NO ISCHEMIA   coronary ct  08/2019   calcium score ZERO.     TONSILLECTOMY AND ADENOIDECTOMY  1979   TRANSTHORACIC ECHOCARDIOGRAM  07/31/2019   normal   VIDEO BRONCHOSCOPY WITH ENDOBRONCHIAL NAVIGATION Right 09/27/2020   Procedure: VIDEO BRONCHOSCOPY WITH ENDOBRONCHIAL NAVIGATION;  Surgeon: [name], DO;  Location: [place];  Service: Pulmonary;  Laterality: Right;   VIDEO BRONCHOSCOPY WITH ENDOBRONCHIAL ULTRASOUND N/A 09/27/2020   Procedure: VIDEO BRONCHOSCOPY WITH ENDOBRONCHIAL ULTRASOUND;  Surgeon: [name], DO;  Location: [place];  Service: Pulmonary;  Laterality: N/A;   WISDOM TOOTH EXTRACTION      Family  History  Problem Relation Age of Onset   Arthritis Mother    Hyperlipidemia Mother    Hypertension Mother    Hypertension Father     Social History   Socioeconomic History   Marital status: Married    Spouse name: Not on file   Number of children: Not on file   Years of education: Not on file   Highest education level: Not on file  Occupational History   Not on file  Tobacco Use   Smoking status: Never   Smokeless tobacco: Never  Vaping Use   Vaping Use: Never used  Substance and Sexual Activity   Alcohol use: Yes    Comment: 10-14   Drug use: No   Sexual activity: Not on file  Other Topics Concern   Not on file  Social History Narrative   Married, two children (one son and one daughter).   Education: [name].   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   No tobacco.  Alc 3-4 beers per week avg.  No alc/drugs.   Exercise: CV and wt's at a GYM 3-5 days a week when feet are not hurting him.   Social Determinants of Health   Financial Resource Strain: Not on file  Food Insecurity: Not on file  Transportation Needs: Not on file  Physical Activity: Not on file  Stress: Not on file  Social Connections: Not on file  Intimate Partner Violence: Not on file    Outpatient Medications Prior to Visit  Medication Sig Dispense Refill   albuterol (VENTOLIN HFA) 108 (90 Base) MCG/ACT inhaler Inhale 2 puffs into the lungs every 6 (six) hours as needed for shortness of breath.     aspirin EC 81 MG EC tablet Take 1 tablet (81 mg total) by mouth daily. 150 tablet 3   indomethacin (INDOCIN) 50 MG capsule 1 tab po tid prn pain 30 capsule 1   losartan (COZAAR) 50 MG tablet Take 1 tablet (50 mg total) by mouth daily. Pt needs to make appt with provider for further refills - 1st attempt 90 tablet 0   Omega-3 Fatty Acids (OMEGA-3 FISH OIL) 500 MG CAPS Take 500 mg by mouth.     eszopiclone (LUNESTA) 1 MG TABS tablet 1-3 tabs po qhs. Take immediately before bedtime  90 tablet 1   glucosamine-chondroitin 500-400 MG tablet Take 1 tablet by mouth daily. (Patient not taking: Reported on 11/11/2020)     No facility-administered medications prior to visit.    Allergies  Allergen Reactions   Statins Other (See Comments)    ?gout    ROS Review of Systems  Constitutional:  Negative for appetite change, chills, fatigue and fever.  HENT:  Negative for congestion, dental problem, ear pain and sore throat.   Eyes:  Negative for discharge, redness and visual disturbance.  Respiratory:  Negative for cough, chest tightness,  shortness of breath and wheezing.   Cardiovascular:  Negative for chest pain, palpitations and leg swelling.  Gastrointestinal:  Negative for abdominal pain, blood in stool, diarrhea, nausea and vomiting.  Genitourinary:  Negative for difficulty urinating, dysuria, flank pain, frequency, hematuria and urgency.  Musculoskeletal:  Negative for arthralgias, back pain, joint swelling, myalgias and neck stiffness.  Skin:  Negative for pallor and rash.  Neurological:  Negative for dizziness, speech difficulty, weakness and headaches.  Hematological:  Negative for adenopathy. Does not bruise/bleed easily.  Psychiatric/Behavioral:  Negative for confusion and sleep disturbance. The patient is not nervous/anxious.    PE; Vitals with BMI 11/11/2020 09/27/2020 09/27/2020  Height 6' 0.75" - -  Weight 304 lbs 6 oz - -  BMI [id number] - -  Systolic [id number]  Diastolic 73 74 65  Pulse 59 60 -   Gen: Alert, well appearing.  Patient is oriented to person, place, time, and situation. AFFECT: pleasant, lucid thought and speech. ENT: Ears: EACs clear, normal epithelium.  TMs with good light reflex and landmarks bilaterally.  Eyes: no injection, icteris, swelling, or exudate.  EOMI, PERRLA. Nose: no drainage or turbinate edema/swelling.  No injection or focal lesion.  Mouth: lips without lesion/swelling.  Oral mucosa pink and moist.  Dentition intact and without  obvious caries or gingival swelling.  Oropharynx without erythema, exudate, or swelling.  Neck: supple/nontender.  No LAD, mass, or TM.  Carotid pulses 2+ bilaterally, without bruits. CV: RRR, no m/r/g.   LUNGS: CTA bilat, nonlabored resps, good aeration in all lung fields. ABD: soft, NT, ND, BS normal.  No hepatospenomegaly or mass.  No bruits. EXT: no clubbing, cyanosis, or edema.  Musculoskeletal: no joint swelling, erythema, warmth, or tenderness.  ROM of all joints intact. Skin - no sores or suspicious lesions or rashes or color changes  Pertinent labs:  Lab Results  Component Value Date   TSH 1.55 11/26/2016   Lab Results  Component Value Date   WBC 6.3 07/31/2019   HGB 15.6 09/27/2020   HCT 46.0 09/27/2020   MCV 88.1 07/31/2019   PLT 224 07/31/2019   Lab Results  Component Value Date   CREATININE 0.90 09/27/2020   BUN 17 09/27/2020   NA 140 09/27/2020   K 4.2 09/27/2020   CL 105 09/27/2020   CO2 24 09/04/2019   Lab Results  Component Value Date   ALT 44 09/04/2019   AST 30 09/04/2019   ALKPHOS 92 09/04/2019   BILITOT 0.4 09/04/2019   Lab Results  Component Value Date   CHOL 169 09/04/2019   Lab Results  Component Value Date   HDL 45 09/04/2019   Lab Results  Component Value Date   LDLCALC 104 (H) 09/04/2019   Lab Results  Component Value Date   TRIG 112 09/04/2019   Lab Results  Component Value Date   CHOLHDL 3.8 09/04/2019   Lab Results  Component Value Date   HGBA1C 5.5 07/31/2019   Lab Results  Component Value Date   LABURIC 7.3 09/22/2019   ASSESSMENT AND PLAN:   1) HTN: well controlled on cozaar 50 qd. Lytes/cr today.  2) HLD: statin caused gout in him (?possibly). If LDL signif elevated on check today then we'll try zetia.  3) Insomnia: lunesta at the '2mg'$  dose works well. Sent in rx for '2mg'$  tabs, 1 qhs prn, #90, RF x1. Controlled substance contract reviewed with patient today.  Patient signed this and it will be placed in the  chart.  4) IFG: fasting glucoses and Hba1c today.  5) Pulm nodules w/enlarged LN-->bx recently benign.  Gets f/u CT chest w/contrast next week.  Dr. Valeta Harms following.  6) Health maintenance exam: Reviewed age and gender appropriate health maintenance issues (prudent diet, regular exercise, health risks of tobacco and excessive alcohol, use of seatbelts, fire alarms in home, use of sunscreen).  Also reviewed age and gender appropriate health screening as well as vaccine recommendations. Vaccines: ALL UTD. Labs: CMET, TSH, FLP Prostate ca screening: average risk patient= as per latest guidelines, start screening at 48 yrs of age. Colon ca screening: average risk patient= as per latest guidelines he is due for screening at any time now.  Options discussed-->he asked if recent PET scan would be adequate for screening.  I don't know but I'll ask GI MD.  An After Visit Summary was printed and given to the patient.  FOLLOW UP:  Return in about 6 months (around 05/14/2021) for routine chronic illness f/u.  Signed:  Crissie Sickles, MD           11/11/2020

## 2020-11-11 NOTE — Patient Instructions (Signed)
Health Maintenance, Male Adopting a healthy lifestyle and getting preventive care are important in promoting health and wellness. Ask your health care provider about: The right schedule for you to have regular tests and exams. Things you can do on your own to prevent diseases and keep yourself healthy. What should I know about diet, weight, and exercise? Eat a healthy diet  Eat a diet that includes plenty of vegetables, fruits, low-fat dairy products, and lean protein. Do not eat a lot of foods that are high in solid fats, added sugars, or sodium.  Maintain a healthy weight Body mass index (BMI) is a measurement that can be used to identify possible weight problems. It estimates body fat based on height and weight. Your health care provider can help determine your BMI and help you achieve or maintain ahealthy weight. Get regular exercise Get regular exercise. This is one of the most important things you can do for your health. Most adults should: Exercise for at least 150 minutes each week. The exercise should increase your heart rate and make you sweat (moderate-intensity exercise). Do strengthening exercises at least twice a week. This is in addition to the moderate-intensity exercise. Spend less time sitting. Even light physical activity can be beneficial. Watch cholesterol and blood lipids Have your blood tested for lipids and cholesterol at 47 years of age, then havethis test every 5 years. You may need to have your cholesterol levels checked more often if: Your lipid or cholesterol levels are high. You are older than 47 years of age. You are at high risk for heart disease. What should I know about cancer screening? Many types of cancers can be detected early and may often be prevented. Depending on your health history and family history, you may need to have cancer screening at various ages. This may include screening for: Colorectal cancer. Prostate cancer. Skin cancer. Lung  cancer. What should I know about heart disease, diabetes, and high blood pressure? Blood pressure and heart disease High blood pressure causes heart disease and increases the risk of stroke. This is more likely to develop in people who have high blood pressure readings, are of African descent, or are overweight. Talk with your health care provider about your target blood pressure readings. Have your blood pressure checked: Every 3-5 years if you are 18-39 years of age. Every year if you are 40 years old or older. If you are between the ages of 65 and 75 and are a current or former smoker, ask your health care provider if you should have a one-time screening for abdominal aortic aneurysm (AAA). Diabetes Have regular diabetes screenings. This checks your fasting blood sugar level. Have the screening done: Once every three years after age 45 if you are at a normal weight and have a low risk for diabetes. More often and at a younger age if you are overweight or have a high risk for diabetes. What should I know about preventing infection? Hepatitis B If you have a higher risk for hepatitis B, you should be screened for this virus. Talk with your health care provider to find out if you are at risk forhepatitis B infection. Hepatitis C Blood testing is recommended for: Everyone born from 1945 through 1965. Anyone with known risk factors for hepatitis C. Sexually transmitted infections (STIs) You should be screened each year for STIs, including gonorrhea and chlamydia, if: You are sexually active and are younger than 47 years of age. You are older than 47 years of age   and your health care provider tells you that you are at risk for this type of infection. Your sexual activity has changed since you were last screened, and you are at increased risk for chlamydia or gonorrhea. Ask your health care provider if you are at risk. Ask your health care provider about whether you are at high risk for HIV.  Your health care provider may recommend a prescription medicine to help prevent HIV infection. If you choose to take medicine to prevent HIV, you should first get tested for HIV. You should then be tested every 3 months for as long as you are taking the medicine. Follow these instructions at home: Lifestyle Do not use any products that contain nicotine or tobacco, such as cigarettes, e-cigarettes, and chewing tobacco. If you need help quitting, ask your health care provider. Do not use street drugs. Do not share needles. Ask your health care provider for help if you need support or information about quitting drugs. Alcohol use Do not drink alcohol if your health care provider tells you not to drink. If you drink alcohol: Limit how much you have to 0-2 drinks a day. Be aware of how much alcohol is in your drink. In the [place] one drink equals one 12 oz bottle of beer (355 mL), one 5 oz glass of wine (148 mL), or one 1 oz glass of hard liquor (44 mL). General instructions Schedule regular health, dental, and eye exams. Stay current with your vaccines. Tell your health care provider if: You often feel depressed. You have ever been abused or do not feel safe at home. Summary Adopting a healthy lifestyle and getting preventive care are important in promoting health and wellness. Follow your health care provider's instructions about healthy diet, exercising, and getting tested or screened for diseases. Follow your health care provider's instructions on monitoring your cholesterol and blood pressure. This information is not intended to replace advice given to you by your health care provider. Make sure you discuss any questions you have with your healthcare provider. Document Revised: 02/26/2018 Document Reviewed: 02/26/2018 Elsevier Patient Education  2022 Elsevier Inc.  

## 2020-11-14 ENCOUNTER — Ambulatory Visit (INDEPENDENT_AMBULATORY_CARE_PROVIDER_SITE_OTHER)
Admission: RE | Admit: 2020-11-14 | Discharge: 2020-11-14 | Disposition: A | Discharge status: Home / Self Care | Payer: 59 | Source: Ambulatory Visit | Attending: Pulmonary Disease | Admitting: Pulmonary Disease

## 2020-11-14 ENCOUNTER — Other Ambulatory Visit: Payer: Self-pay

## 2020-11-14 ENCOUNTER — Telehealth: Payer: Self-pay | Admitting: Family Medicine

## 2020-11-14 DIAGNOSIS — R590 Localized enlarged lymph nodes: Secondary | ICD-10-CM | POA: Diagnosis not present

## 2020-11-14 DIAGNOSIS — Z1211 Encounter for screening for malignant neoplasm of colon: Secondary | ICD-10-CM

## 2020-11-14 DIAGNOSIS — E7800 Pure hypercholesterolemia, unspecified: Secondary | ICD-10-CM

## 2020-11-14 MED ORDER — EZETIMIBE 10 MG PO TABS: 10.0000 mg | ORAL_TABLET | Freq: Every day | ORAL | 2 refills | Status: DC

## 2020-11-14 MED ORDER — IOHEXOL 300 MG/ML  SOLN
75.0000 mL | Freq: Once | INTRAMUSCULAR | Status: AC | PRN
  Administered 2020-11-14: 75 mL via INTRAVENOUS

## 2020-11-14 NOTE — Progress Notes (Signed)
Tammi Sou, MD  11/13/2020  3:27 PM EDT     All labs normal except his LDL cholesterol ("bad cholesterol") is 182, similar to past measurements.  We have to avoid statins b/c they caused him to have gout. I recommend he try zetia '10mg'$  qd to get this number down. #30, RF x 2. Fasting lab appt for lipid panel and AST/ALT in 2-3 mo, dx hypercholesterolemia.

## 2020-11-14 NOTE — Telephone Encounter (Signed)
Pls notify pt that I talked to a gastroenterologist and he said that a PET scan is not a good screening test for colon cancer.  It would not pick up small polyps that need to be removed before they potentially grow into cancer. If pt agreeable to referral to GI for colonoscopy then pls order [facility] referral, dx colon cancer screening.-thx

## 2020-11-15 NOTE — Telephone Encounter (Signed)
Spoke with pt regarding recommendations, he was ok with referral for [facility]. Referral entered and contact information provided via mychart message.

## 2020-11-23 ENCOUNTER — Other Ambulatory Visit: Payer: Self-pay

## 2020-11-23 DIAGNOSIS — R911 Solitary pulmonary nodule: Secondary | ICD-10-CM

## 2020-11-23 NOTE — Progress Notes (Signed)
Cody Cruz, can you let Cody Cruz know that his CT looks good. He needs a repeat CT Chest in 6 months and an appt with me afterwards.   Thanks,  BLI  [name], DO [facility] 11/23/2020 9:09 AM

## 2020-12-08 ENCOUNTER — Other Ambulatory Visit: Payer: Self-pay | Admitting: Cardiovascular Disease

## 2020-12-17 ENCOUNTER — Other Ambulatory Visit: Payer: Self-pay | Admitting: Cardiovascular Disease

## 2021-01-13 ENCOUNTER — Other Ambulatory Visit: Payer: Self-pay

## 2021-01-13 ENCOUNTER — Ambulatory Visit (INDEPENDENT_AMBULATORY_CARE_PROVIDER_SITE_OTHER): Payer: 59

## 2021-01-13 ENCOUNTER — Telehealth: Payer: Self-pay | Admitting: Cardiovascular Disease

## 2021-01-13 DIAGNOSIS — E7800 Pure hypercholesterolemia, unspecified: Secondary | ICD-10-CM | POA: Diagnosis not present

## 2021-01-13 LAB — LIPID PANEL
Cholesterol: 220 mg/dL — ABNORMAL HIGH (ref 0–200)
HDL: 39.9 mg/dL (ref >39.00)
LDL Cholesterol: 147 mg/dL — ABNORMAL HIGH (ref 0–99)
NonHDL: 180.01
Total CHOL/HDL Ratio: 6
Triglycerides: 164 mg/dL — ABNORMAL HIGH (ref 0.0–149.0)
VLDL: 32.8 mg/dL (ref 0.0–40.0)

## 2021-01-13 LAB — ALT: ALT: 42 U/L (ref 0–53)

## 2021-01-13 LAB — AST: AST: 22 U/L (ref 0–37)

## 2021-01-13 MED ORDER — LOSARTAN POTASSIUM 50 MG PO TABS: 50.0000 mg | ORAL_TABLET | Freq: Every day | ORAL | 3 refills | Status: DC

## 2021-01-13 NOTE — Telephone Encounter (Signed)
  *  STAT* If patient is at the pharmacy, call can be transferred to refill team.   1. Which medications need to be refilled? (please list name of each medication and dose if known) losartan (COZAAR) 50 MG tablet  2. Which pharmacy/location (including street and city if local pharmacy) is medication to be sent to? [id number] - [place], [place] - [address]  3. Do they need a 30 day or 90 day supply? 90 days  Pt made an appt with Dr. Johnsie Cancel on 06/06/21

## 2021-01-15 ENCOUNTER — Encounter: Payer: Self-pay | Admitting: Family Medicine

## 2021-02-11 ENCOUNTER — Other Ambulatory Visit: Payer: Self-pay | Admitting: Family Medicine

## 2021-05-03 ENCOUNTER — Other Ambulatory Visit: Payer: Self-pay | Admitting: Family Medicine

## 2021-05-23 NOTE — Progress Notes (Incomplete)
CARDIOLOGY CONSULT NOTE       Patient ID: Cody Cruz MRN: [id number] DOB/AGE: 48/22/1977 48 y.o.  Primary Physician: Tammi Sou, MD Primary Cardiologist: Johnsie Cancel   HPI:  46 y.o. seen in hospital 07/31/19 for somewhat atypical chest pain.  R/O no acute ECG changes Lexiscan myovue with no ischemia EF 53% TTE with normal EF no RWMAls and no valve disease CXR NAD CRFls include HTN and HLD D/c on statin and ARB Prior to admission had not been compliant with his HTN meds   Been fine since d/c no chest pain. BP still up Has not seen primary yet. 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 Daughters in school at [employer]   ***  ROS All other systems reviewed and negative except as noted above  Past Medical History:  Diagnosis Date   Chest pain 07/2019   ACS ruled out. Stress test and echo normal.  BP was out of control at the time of his eval/hosp.   Esophageal abnormality 08/2019   mildly dilated distal esophagus + food debris in esoph noted on coronary CT calcium scoring imaging--refer to GI for eval for possible esoph dysmotility--pt declined this as of 08/2019.   Essential hypertension    Gout    toes   Hyperlipidemia    statins->gout?   Insomnia    lunesta helpful   Mild intermittent asthma    activity induced, typically.  Allergies trigger as a kid.   Obesity, Class II, BMI 35-39.9    OSA on CPAP    [facility]- uses cpap nightly   Plantar fasciitis 01/2019   steroid inj, Dr. Paulla Dolly   Pulmonary nodules 08/2019   incidentally noted on  coronary calcium scoring imaging-> enlarging nodule on 1 yr f/u imaging->pulm eval->LN bx no malignant cells, bronch washings/lavage with some atypical cells favored to be reactive, no malignant cells.    Family History  Problem Relation Age of Onset   Arthritis Mother    Hyperlipidemia Mother    Hypertension Mother    Hypertension Father     Social History   Socioeconomic History   Marital status: Married    Spouse name: Not on file   Number of children: Not on file   Years of education: Not on file   Highest education level: Not on file  Occupational History   Not on file  Tobacco Use   Smoking status: Never   Smokeless tobacco: Never  Vaping Use   Vaping Use: Never used  Substance and Sexual Activity   Alcohol use: Yes    Comment: 10-14   Drug use: No   Sexual activity: Not on file  Other Topics Concern   Not on file  Social History Narrative   Married, two children (one son and one daughter).   Education: [name].   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   No tobacco.  Alc 3-4 beers per week avg.  No alc/drugs.   Exercise: CV and wt's at a GYM  3-5 days a week when feet are not hurting him.   Social Determinants of Health   Financial Resource Strain: Not on file  Food Insecurity: Not on file  Transportation Needs: Not on file  Physical Activity: Not on file  Stress: Not on file  Social Connections: Not on file  Intimate Partner Violence: Not on file    Past Surgical History:  Procedure Laterality Date   APPENDECTOMY  1989   BRONCHIAL BRUSHINGS  09/27/2020   Procedure: BRONCHIAL BRUSHINGS;  Surgeon: [name], DO;  Location: [facility];  Service: Pulmonary;;   BRONCHIAL NEEDLE ASPIRATION BIOPSY  09/27/2020   Procedure: BRONCHIAL NEEDLE ASPIRATION BIOPSIES;  Surgeon: [name], DO;  Location: [facility];  Service: Pulmonary;;   BRONCHIAL WASHINGS  09/27/2020   Procedure: BRONCHIAL WASHINGS;   Surgeon: [name], DO;  Location: [facility];  Service: Pulmonary;;   CARDIOVASCULAR STRESS TEST  07/31/2019   NO ISCHEMIA   coronary ct  08/2019   calcium score ZERO.     TONSILLECTOMY AND ADENOIDECTOMY  1979   TRANSTHORACIC ECHOCARDIOGRAM  07/31/2019   normal   VIDEO BRONCHOSCOPY WITH ENDOBRONCHIAL NAVIGATION Right 09/27/2020   Procedure: VIDEO BRONCHOSCOPY WITH ENDOBRONCHIAL NAVIGATION;  Surgeon: [name], DO;  Location: [facility];  Service: Pulmonary;  Laterality: Right;   VIDEO BRONCHOSCOPY WITH ENDOBRONCHIAL ULTRASOUND N/A 09/27/2020   Procedure: VIDEO BRONCHOSCOPY WITH ENDOBRONCHIAL ULTRASOUND;  Surgeon: [name], DO;  Location: [facility];  Service: Pulmonary;  Laterality: N/A;   WISDOM TOOTH EXTRACTION        Current Outpatient Medications:    albuterol (VENTOLIN HFA) 108 (90 Base) MCG/ACT inhaler, Inhale 2 puffs into the lungs every 6 (six) hours as needed for shortness of breath., Disp: , Rfl:    aspirin EC 81 MG EC tablet, Take 1 tablet (81 mg total) by mouth daily., Disp: 150 tablet, Rfl: 3   eszopiclone (LUNESTA) 2 MG TABS tablet, Take 1 tablet (2 mg total) by mouth at bedtime as needed for sleep. Take immediately before bedtime, Disp: 90 tablet, Rfl: 1   ezetimibe (ZETIA) 10 MG tablet, Take 1 tablet (10 mg total) by mouth daily. OFFICE VISIT NEEDED FOR FURTHER REFILLS, Disp: 30 tablet, Rfl: 0   indomethacin (INDOCIN) 50 MG capsule, 1 tab po tid prn pain, Disp: 30 capsule, Rfl: 1   losartan (COZAAR) 50 MG tablet, Take 1 tablet (50 mg total) by mouth daily., Disp: 90 tablet, Rfl: 3   Omega-3 Fatty Acids (OMEGA-3 FISH OIL) 500 MG CAPS, Take 500 mg by mouth., Disp: , Rfl:     Physical Exam: There were no vitals taken for this visit.   Affect appropriate Healthy:  appears stated age 49: normal Neck supple with no adenopathy JVP normal no bruits no thyromegaly Lungs clear with no wheezing and good diaphragmatic motion Heart:   S1/S2 no murmur, no rub, gallop or click PMI normal Abdomen: benighn, BS positve, no tenderness, no AAA no bruit.  No HSM or HJR Distal pulses intact with no bruits No edema Neuro non-focal Skin warm and dry No muscular weakness   Labs:   Lab Results  Component Value Date   WBC 6.3 07/31/2019   HGB 15.6 09/27/2020   HCT 46.0 09/27/2020   MCV 88.1 07/31/2019   PLT 224 07/31/2019    No results for input(s): NA, K, CL, CO2, BUN, CREATININE, CALCIUM, PROT, BILITOT, ALKPHOS, ALT, AST, GLUCOSE in the last 168 hours.  Invalid input(s): LABALBU No results found  for: CKTOTAL, CKMB, CKMBINDEX, TROPONINI  Lab Results  Component Value Date   CHOL 220 (H) 01/13/2021   CHOL 258 (H) 11/11/2020   CHOL 169 09/04/2019   Lab Results  Component Value Date   HDL 39.90 01/13/2021   HDL 41.80 11/11/2020   HDL 45 09/04/2019   Lab Results  Component Value Date   LDLCALC 147 (H) 01/13/2021   LDLCALC 182 (H) 11/11/2020   LDLCALC 104 (H) 09/04/2019   Lab Results  Component Value Date   TRIG 164.0 (H) 01/13/2021   TRIG 167.0 (H) 11/11/2020   TRIG 112 09/04/2019   Lab Results  Component Value Date   CHOLHDL 6 01/13/2021   CHOLHDL 6 11/11/2020   CHOLHDL 3.8 09/04/2019   No results found for: LDLDIRECT    Radiology: No results found.  EKG: NSR normal eCG 07/31/19   ASSESSMENT AND PLAN:   1. Chest pain atypical r/o normal ECG, Echo and myovue 07/31/19.  Also reassuring that calcium score was 0 on 08/25/19   2. HLD :  Continue statin  LDL improved 180 range to 147  On zetia ? Gout flare with statin Discussed trial of crestor vs f/u calcium score in a year to see if still 0 and observe Can also check NMR/Lpa to assess risk ***  3. HTN: discussed diet, weight loss and exercise continue ARB increased dose to 50 mg daily on 01/13/21   4. Gout:  Avoid dehydration discuss steroids, colchicine and xray with primary Doubt this is from statin   5. Lung Nodule:  post biopsy/PET negative  biopsy f/u CT scan with Icard latter this month   F/U with me in a year   Time spent reviewing chart, echo myovue direct patient interview and composing note 25 minutes   Signed: Jenkins Rouge 05/23/2021, 9:48 AM

## 2021-05-26 ENCOUNTER — Telehealth: Payer: Self-pay | Admitting: Pulmonary Disease

## 2021-05-26 DIAGNOSIS — R911 Solitary pulmonary nodule: Secondary | ICD-10-CM

## 2021-05-26 NOTE — Telephone Encounter (Signed)
ATC patient to let him know that he needs to have lab work done before his CT scan on Wednesday, per DPR left detailed message asking him to come in on Monday to get that done so that it can be resulted before Wednesday. Order has been placed.  Advised him to call the office back with any questions. Nothing further needed at this time. ?

## 2021-05-29 ENCOUNTER — Other Ambulatory Visit (INDEPENDENT_AMBULATORY_CARE_PROVIDER_SITE_OTHER): Payer: 59

## 2021-05-29 DIAGNOSIS — R911 Solitary pulmonary nodule: Secondary | ICD-10-CM

## 2021-05-29 LAB — BASIC METABOLIC PANEL
BUN: 21 mg/dL (ref 6–23)
CO2: 28 mEq/L (ref 19–32)
Calcium: 9.7 mg/dL (ref 8.4–10.5)
Chloride: 101 mEq/L (ref 96–112)
Creatinine, Ser: 1.07 mg/dL (ref 0.40–1.50)
GFR: 82.48 mL/min (ref >60.00)
Glucose, Bld: 100 mg/dL — ABNORMAL HIGH (ref 70–99)
Potassium: 4 mEq/L (ref 3.5–5.1)
Sodium: 139 mEq/L (ref 135–145)

## 2021-05-30 ENCOUNTER — Other Ambulatory Visit: Payer: Self-pay | Admitting: Family Medicine

## 2021-05-31 ENCOUNTER — Ambulatory Visit (INDEPENDENT_AMBULATORY_CARE_PROVIDER_SITE_OTHER)
Admission: RE | Admit: 2021-05-31 | Discharge: 2021-05-31 | Disposition: A | Discharge status: Home / Self Care | Payer: 59 | Source: Ambulatory Visit | Attending: Pulmonary Disease | Admitting: Pulmonary Disease

## 2021-05-31 ENCOUNTER — Other Ambulatory Visit: Payer: Self-pay

## 2021-05-31 DIAGNOSIS — R911 Solitary pulmonary nodule: Secondary | ICD-10-CM | POA: Diagnosis not present

## 2021-05-31 MED ORDER — IOHEXOL 300 MG/ML  SOLN
80.0000 mL | Freq: Once | INTRAMUSCULAR | Status: AC | PRN
  Administered 2021-05-31: 80 mL via INTRAVENOUS

## 2021-06-01 NOTE — Progress Notes (Signed)
Cody Cruz,  ? ?Please let patient know that I reviewed his CT scan.  Lymph nodes are stable some of which have decreased in size.Marland Kitchen  He needs a repeat CT scan of the chest with contrast in 1 year. ? ?Please make a note in the order to schedule the patient an appointment following the CT chest in 1 year. ? ?Thanks, ? ?BLI ? ?[name], DO ?[facility] ?06/01/2021 10:12 AM  ?

## 2021-06-02 ENCOUNTER — Other Ambulatory Visit: Payer: Self-pay

## 2021-06-02 DIAGNOSIS — R911 Solitary pulmonary nodule: Secondary | ICD-10-CM

## 2021-06-06 ENCOUNTER — Ambulatory Visit (INDEPENDENT_AMBULATORY_CARE_PROVIDER_SITE_OTHER): Payer: 59 | Admitting: Cardiovascular Disease

## 2021-06-06 ENCOUNTER — Encounter: Payer: Self-pay | Admitting: Cardiovascular Disease

## 2021-06-06 ENCOUNTER — Encounter: Payer: Self-pay | Admitting: Gastroenterology

## 2021-06-06 ENCOUNTER — Other Ambulatory Visit: Payer: Self-pay

## 2021-06-06 VITALS — BP 132/74 | HR 62 | Ht 73.0 in | Wt 305.0 lb

## 2021-06-06 DIAGNOSIS — E782 Mixed hyperlipidemia: Secondary | ICD-10-CM | POA: Diagnosis not present

## 2021-06-06 DIAGNOSIS — R079 Chest pain, unspecified: Secondary | ICD-10-CM

## 2021-06-06 DIAGNOSIS — I10 Essential (primary) hypertension: Secondary | ICD-10-CM | POA: Diagnosis not present

## 2021-06-06 MED ORDER — ROSUVASTATIN CALCIUM 5 MG PO TABS: 5.0000 mg | ORAL_TABLET | Freq: Every day | ORAL | 3 refills | Status: DC

## 2021-06-06 NOTE — Patient Instructions (Addendum)
Medication Instructions:  ?Your physician has recommended you make the following change in your medication:  ? ?1-START Crestor 5 mg by mouth daily.  ? ?*If you need a refill on your cardiac medications before your next appointment, please call your pharmacy* ? ?Lab Work: ?Your physician recommends that you return for lab work in: 3 months for fasting lipid and liver panel. ? ?If you have labs (blood work) drawn today and your tests are completely normal, you will receive your results only by: ?MyChart Message (if you have MyChart) OR ?A paper copy in the mail ?If you have any lab test that is abnormal or we need to change your treatment, we will call you to review the results. ? ?Testing/Procedures: ?None ordered today. ? ?Follow-Up: ?At [facility], you and your health needs are our priority.  As part of our continuing mission to provide you with exceptional heart care, we have created designated Provider Care Teams.  These Care Teams include your primary Cardiologist (physician) and Advanced Practice Providers (APPs -  Physician Assistants and Nurse Practitioners) who all work together to provide you with the care you need, when you need it. ? ?We recommend signing up for the patient portal called "MyChart".  Sign up information is provided on this After Visit Summary.  MyChart is used to connect with patients for Virtual Visits (Telemedicine).  Patients are able to view lab/test results, encounter notes, upcoming appointments, etc.  Non-urgent messages can be sent to your provider as well.   ?To learn more about what you can do with MyChart, go to [email or web address].   ? ?Your next appointment:   ?1 year(s) ? ?The format for your next appointment:   ?In Person ? ?Provider:   ?Jenkins Rouge, MD { ? ? ?

## 2021-06-13 ENCOUNTER — Ambulatory Visit (AMBULATORY_SURGERY_CENTER): Payer: 59

## 2021-06-13 ENCOUNTER — Other Ambulatory Visit: Payer: Self-pay

## 2021-06-13 VITALS — Ht 73.0 in | Wt 295.0 lb

## 2021-06-13 DIAGNOSIS — Z800 Family history of malignant neoplasm of digestive organs: Secondary | ICD-10-CM

## 2021-06-13 DIAGNOSIS — Z1211 Encounter for screening for malignant neoplasm of colon: Secondary | ICD-10-CM

## 2021-06-13 MED ORDER — NA SULFATE-K SULFATE-MG SULF 17.5-3.13-1.6 GM/177ML PO SOLN: 1.0000 | Freq: Once | ORAL | 0 refills | Status: AC

## 2021-06-13 NOTE — Progress Notes (Signed)
?  Patient's pre-visit was done today over the phone with the patient  ? ?Name,DOB and address verified.  ?  ?Patient denies any allergies to Eggs and Soy.  ? ?Patient denies any problems with anesthesia/sedation. ? ?Patient denies taking diet pills or blood thinners.  ? ?Denies atrial flutter or atrial fib ? ?Denies chronic constipation ? ?No home Oxygen.  ? ?Packet of Prep instructions sent by My Chart or mail to patient including a copy of a consent form if by mail-pt is aware.  ? ?Patient understands to call us back with any questions or concerns.  ? ?Patient is aware of our care-partner policy and [id number] safety protocol.  ? ?EMMI education assigned to the patient for the procedure, sent to [facility].  ? ?  ? ?  ?

## 2021-06-21 ENCOUNTER — Encounter: Payer: Self-pay | Admitting: Gastroenterology

## 2021-07-03 ENCOUNTER — Encounter: Payer: Self-pay | Admitting: Gastroenterology

## 2021-07-03 ENCOUNTER — Ambulatory Visit (AMBULATORY_SURGERY_CENTER): Payer: 59 | Admitting: Gastroenterology

## 2021-07-03 VITALS — BP 114/68 | HR 49 | Temp 97.8°F | Resp 16 | Ht 73.0 in | Wt 295.0 lb

## 2021-07-03 DIAGNOSIS — Z1211 Encounter for screening for malignant neoplasm of colon: Secondary | ICD-10-CM | POA: Diagnosis not present

## 2021-07-03 DIAGNOSIS — D122 Benign neoplasm of ascending colon: Secondary | ICD-10-CM

## 2021-07-03 DIAGNOSIS — Z800 Family history of malignant neoplasm of digestive organs: Secondary | ICD-10-CM

## 2021-07-03 DIAGNOSIS — D120 Benign neoplasm of cecum: Secondary | ICD-10-CM

## 2021-07-03 DIAGNOSIS — D123 Benign neoplasm of transverse colon: Secondary | ICD-10-CM | POA: Diagnosis not present

## 2021-07-03 MED ORDER — SODIUM CHLORIDE 0.9 % IV SOLN: 500.0000 mL | Freq: Once | INTRAVENOUS | Status: DC

## 2021-07-03 NOTE — Op Note (Signed)
[facility] ?Patient Name: Cody Cruz ?Procedure Date: 07/03/2021 9:49 AM ?MRN: [id number] ?Endoscopist: Estill Cotta. Loletha Carrow , MD ?Age: 46 ?Referring MD:  ?Date of Birth: 20-Jan-1976 ?Gender: Male ?Account #: [id number] ?Procedure:                Colonoscopy ?Indications:              Screening in patient at increased risk: Colorectal  ?                          cancer in father 61 or older, This is the patient's  ?                          first colonoscopy ?Medicines:                Monitored Anesthesia Care ?Procedure:                Pre-Anesthesia Assessment: ?                          - Prior to the procedure, a History and Physical  ?                          was performed, and patient medications and  ?                          allergies were reviewed. The patient's tolerance of  ?                          previous anesthesia was also reviewed. The risks  ?                          and benefits of the procedure and the sedation  ?                          options and risks were discussed with the patient.  ?                          All questions were answered, and informed consent  ?                          was obtained. Prior Anticoagulants: The patient has  ?                          taken no previous anticoagulant or antiplatelet  ?                          agents. ASA Grade Assessment: III - A patient with  ?                          severe systemic disease. After reviewing the risks  ?                          and benefits, the patient was deemed in  ?  satisfactory condition to undergo the procedure. ?                          After obtaining informed consent, the colonoscope  ?                          was passed under direct vision. Throughout the  ?                          procedure, the patient's blood pressure, pulse, and  ?                          oxygen saturations were monitored continuously. The  ?                          CF [id number] #[id number] was introduced  through the anus  ?                          and advanced to the the cecum, identified by  ?                          appendiceal orifice and ileocecal valve. The  ?                          colonoscopy was performed without difficulty. The  ?                          patient tolerated the procedure well. The quality  ?                          of the bowel preparation was good. The ileocecal  ?                          valve, appendiceal orifice, and rectum were  ?                          photographed. The bowel preparation used was SUPREP. ?Scope In: 9:57:58 AM ?Scope Out: 10:18:54 AM ?Scope Withdrawal Time: 0 hours 19 minutes 6 seconds  ?Total Procedure Duration: 0 hours 20 minutes 56 seconds  ?Findings:                 The perianal and digital rectal examinations were  ?                          normal. ?                          Repeat examination of right colon under NBI  ?                          performed. ?                          Four semi-sessile polyps were found in the proximal  ?  transverse colon, ascending colon and cecum. The  ?                          polyps were 3 to 5 mm in size. These polyps were  ?                          removed with a cold snare. Resection and retrieval  ?                          were complete. ?                          A 6 mm polyp was found in the proximal transverse  ?                          colon. The polyp was semi-sessile. The polyp was  ?                          removed with a cold snare. Resection and retrieval  ?                          were complete. ?                          The exam was otherwise without abnormality on  ?                          direct and retroflexion views. ?Complications:            No immediate complications. ?Estimated Blood Loss:     Estimated blood loss was minimal. ?Impression:               - Four 3 to 5 mm polyps in the proximal transverse  ?                          colon, in the ascending colon and  in the cecum,  ?                          removed with a cold snare. Resected and retrieved. ?                          - One 6 mm polyp in the proximal transverse colon,  ?                          removed with a cold snare. Resected and retrieved. ?                          - The examination was otherwise normal on direct  ?                          and retroflexion views. ?Recommendation:           - Patient has a contact number available for  ?  emergencies. The signs and symptoms of potential  ?                          delayed complications were discussed with the  ?                          patient. Return to normal activities tomorrow.  ?                          Written discharge instructions were provided to the  ?                          patient. ?                          - Resume previous diet. ?                          - Continue present medications. ?                          - Await pathology results. ?                          - Repeat colonoscopy is recommended for  ?                          surveillance. The colonoscopy date will be  ?                          determined after pathology results from today's  ?                          exam become available for review. ?Elkin Belfield L. Loletha Carrow, MD ?07/03/2021 10:25:01 AM ?This report has been signed electronically. ?

## 2021-07-03 NOTE — Progress Notes (Signed)
History and Physical: ? This patient presents for endoscopic testing for: ?Encounter Diagnosis  ?Name Primary?  ? Family history of colon cancer in father Yes  ? ? ?Father had CRC age 48 ?This is the patient's first colonoscopy. ?Patient denies chronic abdominal pain, rectal bleeding, constipation or diarrhea. ? ? ?ROS: ?Patient denies chest pain or shortness of breath ? ? ?Past Medical History: ?Past Medical History:  ?Diagnosis Date  ? Chest pain 07/2019  ? ACS ruled out. Stress test and echo normal.  BP was out of control at the time of his eval/hosp.  ? Esophageal abnormality 08/2019  ? mildly dilated distal esophagus + food debris in esoph noted on coronary CT calcium scoring imaging--refer to GI for eval for possible esoph dysmotility--pt declined this as of 08/2019.  ? Essential hypertension   ? Gout   ? toes  ? Hyperlipidemia   ? statins->gout?  ? Insomnia   ? lunesta helpful  ? Mild intermittent asthma   ? activity induced, typically.  Allergies trigger as a kid.  ? Obesity, Class II, BMI 35-39.9   ? OSA on CPAP   ? [facility]- uses cpap nightly  ? Plantar fasciitis 01/2019  ? steroid inj, Dr. Paulla Dolly  ? Pulmonary nodules 08/2019  ? incidentally noted on coronary calcium scoring imaging-> enlarging nodule on 1 yr f/u imaging->pulm eval->LN bx no malignant cells, bronch washings/lavage with some atypical cells favored to be reactive, no malignant cells.  ? Sleep apnea   ? cpap  ? ? ? ?Past Surgical History: ?Past Surgical History:  ?Procedure Laterality Date  ? APPENDECTOMY  1989  ? BRONCHIAL BRUSHINGS  09/27/2020  ? Procedure: BRONCHIAL BRUSHINGS;  Surgeon: [name], DO;  Location: [facility];  Service: Pulmonary;;  ? BRONCHIAL NEEDLE ASPIRATION BIOPSY  09/27/2020  ? Procedure: BRONCHIAL NEEDLE ASPIRATION BIOPSIES;  Surgeon: [name], DO;  Location: [facility];  Service: Pulmonary;;  ? BRONCHIAL WASHINGS  09/27/2020  ? Procedure: BRONCHIAL WASHINGS;  Surgeon: [name], DO;  Location:  [facility];  Service: Pulmonary;;  ? CARDIOVASCULAR STRESS TEST  07/31/2019  ? NO ISCHEMIA  ? coronary ct  08/2019  ? calcium score ZERO.    ? TONSILLECTOMY AND ADENOIDECTOMY  1979  ? TRANSTHORACIC ECHOCARDIOGRAM  07/31/2019  ? normal  ? VIDEO BRONCHOSCOPY WITH ENDOBRONCHIAL NAVIGATION Right 09/27/2020  ? Procedure: VIDEO BRONCHOSCOPY WITH ENDOBRONCHIAL NAVIGATION;  Surgeon: [name], DO;  Location: [facility];  Service: Pulmonary;  Laterality: Right;  ? VIDEO BRONCHOSCOPY WITH ENDOBRONCHIAL ULTRASOUND N/A 09/27/2020  ? Procedure: VIDEO BRONCHOSCOPY WITH ENDOBRONCHIAL ULTRASOUND;  Surgeon: [name], DO;  Location: [facility];  Service: Pulmonary;  Laterality: N/A;  ? WISDOM TOOTH EXTRACTION    ? ? ?Allergies: ?Allergies  ?Allergen Reactions  ? Statins Other (See Comments)  ?  ?gout  ? ? ?Outpatient Meds: ?Current Outpatient Medications  ?Medication Sig Dispense Refill  ? aspirin EC 81 MG EC tablet Take 1 tablet (81 mg total) by mouth daily. 150 tablet 3  ? eszopiclone (LUNESTA) 2 MG TABS tablet Take 1 tablet (2 mg total) by mouth at bedtime as needed for sleep. Take immediately before bedtime 90 tablet 1  ? ezetimibe (ZETIA) 10 MG tablet Take 1 tablet (10 mg total) by mouth daily. OFFICE VISIT NEEDED FOR FURTHER REFILLS 30 tablet 0  ? losartan (COZAAR) 50 MG tablet Take 1 tablet (50 mg total) by mouth daily. 90 tablet 3  ? Omega-3 Fatty Acids (OMEGA-3 FISH OIL) 500  MG CAPS Take 500 mg by mouth.    ? rosuvastatin (CRESTOR) 5 MG tablet Take 1 tablet (5 mg total) by mouth daily. 90 tablet 3  ? albuterol (VENTOLIN HFA) 108 (90 Base) MCG/ACT inhaler Inhale 2 puffs into the lungs every 6 (six) hours as needed for shortness of breath. (Patient not taking: Reported on 06/13/2021)    ? indomethacin (INDOCIN) 50 MG capsule 1 tab po tid prn pain (Patient not taking: Reported on 07/03/2021) 30 capsule 1  ? ?Current Facility-Administered Medications  ?Medication Dose Route Frequency Provider Last Rate Last  Admin  ? 0.9 %  sodium chloride infusion  500 mL Intravenous Once Doran Stabler, MD      ? ? ? ? ?___________________________________________________________________ ?Objective  ? ?Exam: ? ?BP 112/75   Pulse 60   Temp 97.8 ?F (36.6 ?C) (Skin)   Ht '6\' 1"'$  (1.854 m)   Wt 295 lb (133.8 kg)   SpO2 97%   BMI 38.92 kg/m?  ? ?CV: RRR without murmur, S1/S2 ?Resp: clear to auscultation bilaterally, normal RR and effort noted ?GI: soft, no tenderness, with active bowel sounds. ? ? ?Assessment: ?Encounter Diagnosis  ?Name Primary?  ? Family history of colon cancer in father Yes  ? ? ? ?Plan: ?Colonoscopy ? The benefits and risks of the planned procedure were described in detail with the patient or (when appropriate) their health care proxy.  Risks were outlined as including, but not limited to, bleeding, infection, perforation, adverse medication reaction leading to cardiac or pulmonary decompensation, pancreatitis (if ERCP).  The limitation of incomplete mucosal visualization was also discussed.  No guarantees or warranties were given. ? ? ? ?The patient is appropriate for an endoscopic procedure in the ambulatory setting. ? ? - Wilfrid Lund, MD ? ? ? ? ?

## 2021-07-03 NOTE — Progress Notes (Signed)
Nad vss trans to pacu 

## 2021-07-03 NOTE — Progress Notes (Signed)
Called to room to assist during endoscopic procedure.  Patient ID and intended procedure confirmed with present staff. Received instructions for my participation in the procedure from the performing physician.  

## 2021-07-03 NOTE — Progress Notes (Signed)
Pt's states no medical or surgical changes since previsit or office visit. 

## 2021-07-03 NOTE — Patient Instructions (Signed)
Information on polyps given to you today.  Await pathology results.  Resume previous diet and medications.  YOU HAD AN ENDOSCOPIC PROCEDURE TODAY AT THE [facility]:   Refer to the procedure report that was given to you for any specific questions about what was found during the examination.  If the procedure report does not answer your questions, please call your gastroenterologist to clarify.  If you requested that your care partner not be given the details of your procedure findings, then the procedure report has been included in a sealed envelope for you to review at your convenience later.  YOU SHOULD EXPECT: Some feelings of bloating in the abdomen. Passage of more gas than usual.  Walking can help get rid of the air that was put into your GI tract during the procedure and reduce the bloating. If you had a lower endoscopy (such as a colonoscopy or flexible sigmoidoscopy) you may notice spotting of blood in your stool or on the toilet paper. If you underwent a bowel prep for your procedure, you may not have a normal bowel movement for a few days.  Please Note:  You might notice some irritation and congestion in your nose or some drainage.  This is from the oxygen used during your procedure.  There is no need for concern and it should clear up in a day or so.  SYMPTOMS TO REPORT IMMEDIATELY:   Following lower endoscopy (colonoscopy or flexible sigmoidoscopy):  Excessive amounts of blood in the stool  Significant tenderness or worsening of abdominal pains  Swelling of the abdomen that is new, acute  Fever of 100F or higher   For urgent or emergent issues, a gastroenterologist can be reached at any hour by calling [phone]. Do not use MyChart messaging for urgent concerns.    DIET:  We do recommend a small meal at first, but then you may proceed to your regular diet.  Drink plenty of fluids but you should avoid alcoholic beverages for 24 hours.  ACTIVITY:  You should  plan to take it easy for the rest of today and you should NOT DRIVE or use heavy machinery until tomorrow (because of the sedation medicines used during the test).    FOLLOW UP: Our staff will call the number listed on your records 48-72 hours following your procedure to check on you and address any questions or concerns that you may have regarding the information given to you following your procedure. If we do not reach you, we will leave a message.  We will attempt to reach you two times.  During this call, we will ask if you have developed any symptoms of COVID 19. If you develop any symptoms (ie: fever, flu-like symptoms, shortness of breath, cough etc.) before then, please call [phone].  If you test positive for Covid 19 in the 2 weeks post procedure, please call and report this information to us.    If any biopsies were taken you will be contacted by phone or by letter within the next 1-3 weeks.  Please call us at [phone] if you have not heard about the biopsies in 3 weeks.    SIGNATURES/CONFIDENTIALITY: You and/or your care partner have signed paperwork which will be entered into your electronic medical record.  These signatures attest to the fact that that the information above on your After Visit Summary has been reviewed and is understood.  Full responsibility of the confidentiality of this discharge information lies with you and/or your care-partner. 

## 2021-07-05 ENCOUNTER — Telehealth: Payer: Self-pay

## 2021-07-05 NOTE — Telephone Encounter (Signed)
?  Follow up Call- ? ? ?  07/03/2021  ?  9:20 AM  ?Call back number  ?Post procedure Call Back phone  # [phone]  ?Permission to leave phone message Yes  ?  ? ?Patient questions: ? ?Do you have a fever, pain , or abdominal swelling? No. ?Pain Score  0 * ? ?Have you tolerated food without any problems? Yes.   ? ?Have you been able to return to your normal activities? Yes.   ? ?Do you have any questions about your discharge instructions: ?Diet   No. ?Medications  No. ?Follow up visit  No. ? ?Do you have questions or concerns about your Care? No. ? ?Actions: ?* If pain score is 4 or above: ?No action needed, pain <4. ? ? ?

## 2021-07-06 ENCOUNTER — Encounter: Payer: Self-pay | Admitting: Gastroenterology

## 2021-08-07 ENCOUNTER — Encounter: Payer: Self-pay | Admitting: Cardiovascular Disease

## 2021-08-07 DIAGNOSIS — E782 Mixed hyperlipidemia: Secondary | ICD-10-CM

## 2021-08-23 ENCOUNTER — Encounter: Payer: Self-pay | Admitting: Family Medicine

## 2021-08-23 ENCOUNTER — Ambulatory Visit (INDEPENDENT_AMBULATORY_CARE_PROVIDER_SITE_OTHER): Payer: 59 | Admitting: Family Medicine

## 2021-08-23 VITALS — BP 124/78 | HR 75 | Temp 98.1°F | Ht 73.0 in | Wt 294.0 lb

## 2021-08-23 DIAGNOSIS — E7800 Pure hypercholesterolemia, unspecified: Secondary | ICD-10-CM

## 2021-08-23 DIAGNOSIS — F5101 Primary insomnia: Secondary | ICD-10-CM

## 2021-08-23 DIAGNOSIS — I10 Essential (primary) hypertension: Secondary | ICD-10-CM | POA: Diagnosis not present

## 2021-08-23 DIAGNOSIS — M79674 Pain in right toe(s): Secondary | ICD-10-CM

## 2021-08-23 LAB — COMPREHENSIVE METABOLIC PANEL
ALT: 33 U/L (ref 0–53)
AST: 21 U/L (ref 0–37)
Albumin: 4.8 g/dL (ref 3.5–5.2)
Alkaline Phosphatase: 72 U/L (ref 39–117)
BUN: 18 mg/dL (ref 6–23)
CO2: 28 mEq/L (ref 19–32)
Calcium: 10 mg/dL (ref 8.4–10.5)
Chloride: 101 mEq/L (ref 96–112)
Creatinine, Ser: 1 mg/dL (ref 0.40–1.50)
GFR: 89.3 mL/min (ref >60.00)
Glucose, Bld: 89 mg/dL (ref 70–99)
Potassium: 4.2 mEq/L (ref 3.5–5.1)
Sodium: 138 mEq/L (ref 135–145)
Total Bilirubin: 0.8 mg/dL (ref 0.2–1.2)
Total Protein: 7.2 g/dL (ref 6.0–8.3)

## 2021-08-23 MED ORDER — EZETIMIBE 10 MG PO TABS: 10.0000 mg | ORAL_TABLET | Freq: Every day | ORAL | 1 refills | Status: DC

## 2021-08-23 MED ORDER — INDOMETHACIN 50 MG PO CAPS: ORAL_CAPSULE | ORAL | 1 refills | Status: DC

## 2021-08-23 MED ORDER — ESZOPICLONE 2 MG PO TABS
2.0000 mg | ORAL_TABLET | Freq: Every evening | ORAL | 1 refills | Status: DC | PRN
Start: 2021-08-23 — End: 2022-05-02

## 2021-08-23 NOTE — Progress Notes (Signed)
OFFICE VISIT  08/23/2021  CC:  Chief Complaint  Patient presents with   Hyperlipidemia    Pt has stopped taking Crestor 2 weeks ago due to gout flare. Has cardiologist f/u on 6/22 and planning on lipid panel check at that time.    Hypertension   Patient is a 46 y.o. male who presents for f/u HTN, HLD, and insomnia.  I last saw him 11/11/20. A/P as of that visit: "1) HTN: well controlled on cozaar 50 qd. Lytes/cr today.   2) HLD: statin caused gout in him (?possibly). If LDL signif elevated on check today then we'll try zetia.   3) Insomnia: lunesta at the '2mg'$  dose works well. Sent in rx for '2mg'$  tabs, 1 qhs prn, #90, RF x1. Controlled substance contract reviewed with patient today.  Patient signed this and it will be placed in the chart.     4) IFG: fasting glucoses and Hba1c today.   5) Pulm nodules w/enlarged LN-->bx recently benign.  Gets f/u CT chest w/contrast next week.  Dr. Valeta Harms following.   6) Health maintenance exam: Reviewed age and gender appropriate health maintenance issues (prudent diet, regular exercise, health risks of tobacco and excessive alcohol, use of seatbelts, fire alarms in home, use of sunscreen).  Also reviewed age and gender appropriate health screening as well as vaccine recommendations. Vaccines: ALL UTD. Labs: CMET, TSH, FLP Prostate ca screening: average risk patient= as per latest guidelines, start screening at 4 yrs of age. Colon ca screening: average risk patient= as per latest guidelines he is due for screening at any time now.  Options discussed-->he asked if recent PET scan would be adequate for screening.  I don't know but I'll ask GI MD."  INTERIM HX: Feeling well. Home blood pressures consistently less than 130/80.  Hyperlipidemia: He saw Dr. Johnsie Cancel in March this year for hyperlipidemia, questionable history of gout from taking a statin.  Zetia had improved LDL some--> he has been on this for the last 10 months or so.  Dr. Johnsie Cancel put him  on Crestor 3 days a week starting 06/06/21. Yaniv says his toe pain returned about 6 weeks after this (points at MTP regions of 2nd and 3rd toes on R foot).  He says when he stopped the medicine in the pain seemed resolved.  He did use some Indocin.  Says Johnnye Sima is helpful for insomnia but he is still dealing with this some.  He has appointment with his pulmonologist soon to follow-up on obstructive sleep apnea. PMP AWARE reviewed today: most recent rx for eszopiclone was filled 05/03/21, # 61, rx by me. No red flags.  ROS as above, plus--> no fevers, no CP, no SOB, no wheezing, no cough, no dizziness, no HAs, no rashes, no melena/hematochezia.  No polyuria or polydipsia.  No myalgias.  No focal weakness, paresthesias, or tremors.  No acute vision or hearing abnormalities.  No dysuria or unusual/new urinary urgency or frequency.  No recent changes in lower legs. No n/v/d or abd pain.  No palpitations.    Past Medical History:  Diagnosis Date   Chest pain 07/2019   ACS ruled out. Stress test and echo normal.  BP was out of control at the time of his eval/hosp.   Esophageal abnormality 08/2019   mildly dilated distal esophagus + food debris in esoph noted on coronary CT calcium scoring imaging--refer to GI for eval for possible esoph dysmotility--pt declined this as of 08/2019.   Essential hypertension    Gout  toes   Hyperlipidemia    statins->gout?   Insomnia    lunesta helpful   Mild intermittent asthma    activity induced, typically.  Allergies trigger as a kid.   Obesity, Class II, BMI 35-39.9    OSA on CPAP    [facility]- uses cpap nightly   Plantar fasciitis 01/2019   steroid inj, Dr. Paulla Dolly   Pulmonary nodules 08/2019   incidentally noted on coronary calcium scoring imaging-> enlarging nodule on 1 yr f/u imaging->pulm eval->LN bx no malignant cells, bronch washings/lavage with some atypical cells favored to be reactive, no malignant cells.    Past Surgical History:  Procedure  Laterality Date   APPENDECTOMY  1989   BRONCHIAL BRUSHINGS  09/27/2020   Procedure: BRONCHIAL BRUSHINGS;  Surgeon: [name], DO;  Location: [facility];  Service: Pulmonary;;   BRONCHIAL NEEDLE ASPIRATION BIOPSY  09/27/2020   Procedure: BRONCHIAL NEEDLE ASPIRATION BIOPSIES;  Surgeon: [name], DO;  Location: [facility];  Service: Pulmonary;;   BRONCHIAL WASHINGS  09/27/2020   Procedure: BRONCHIAL WASHINGS;  Surgeon: [name], DO;  Location: [facility];  Service: Pulmonary;;   CARDIOVASCULAR STRESS TEST  07/31/2019   NO ISCHEMIA   coronary ct  08/2019   calcium score ZERO.     TONSILLECTOMY AND ADENOIDECTOMY  1979   TRANSTHORACIC ECHOCARDIOGRAM  07/31/2019   normal   VIDEO BRONCHOSCOPY WITH ENDOBRONCHIAL NAVIGATION Right 09/27/2020   Procedure: VIDEO BRONCHOSCOPY WITH ENDOBRONCHIAL NAVIGATION;  Surgeon: [name], DO;  Location: [facility];  Service: Pulmonary;  Laterality: Right;   VIDEO BRONCHOSCOPY WITH ENDOBRONCHIAL ULTRASOUND N/A 09/27/2020   Procedure: VIDEO BRONCHOSCOPY WITH ENDOBRONCHIAL ULTRASOUND;  Surgeon: [name], DO;  Location: [facility];  Service: Pulmonary;  Laterality: N/A;   WISDOM TOOTH EXTRACTION      Outpatient Medications Prior to Visit  Medication Sig Dispense Refill   albuterol (VENTOLIN HFA) 108 (90 Base) MCG/ACT inhaler Inhale 2 puffs into the lungs every 6 (six) hours as needed for shortness of breath. (Patient not taking: Reported on 06/13/2021)     aspirin EC 81 MG EC tablet Take 1 tablet (81 mg total) by mouth daily. 150 tablet 3   losartan (COZAAR) 50 MG tablet Take 1 tablet (50 mg total) by mouth daily. 90 tablet 3   Omega-3 Fatty Acids (OMEGA-3 FISH OIL) 500 MG CAPS Take 500 mg by mouth.     eszopiclone (LUNESTA) 2 MG TABS tablet Take 1 tablet (2 mg total) by mouth at bedtime as needed for sleep. Take immediately before bedtime 90 tablet 1   ezetimibe (ZETIA) 10 MG tablet Take 1 tablet (10 mg total) by mouth  daily. OFFICE VISIT NEEDED FOR FURTHER REFILLS (Patient not taking: Reported on 08/23/2021) 30 tablet 0   indomethacin (INDOCIN) 50 MG capsule 1 tab po tid prn pain (Patient not taking: Reported on 07/03/2021) 30 capsule 1   rosuvastatin (CRESTOR) 5 MG tablet Take 1 tablet (5 mg total) by mouth daily. (Patient not taking: Reported on 08/23/2021) 90 tablet 3   No facility-administered medications prior to visit.    Allergies  Allergen Reactions   Statins Other (See Comments)    ?gout    ROS As per HPI  PE:    08/23/2021    9:01 AM 07/03/2021   10:44 AM 07/03/2021   10:34 AM  Vitals with BMI  Height '6\' 1"'$     Weight 294 lbs    BMI 44.3    Systolic 154 008 99  Diastolic 78 68 45  Pulse 75 49 53   Physical Exam  Gen: Alert, well appearing.  Patient is oriented to person, place, time, and situation. AFFECT: pleasant, lucid thought and speech. CV: RRR, no m/r/g.   LUNGS: CTA bilat, nonlabored resps, good aeration in all lung fields. EXT: no clubbing or cyanosis.  no edema.  Feet: No swelling, erythema, or tenderness of any toes.  LABS:  Last CBC Lab Results  Component Value Date   WBC 6.3 07/31/2019   HGB 15.6 09/27/2020   HCT 46.0 09/27/2020   MCV 88.1 07/31/2019   MCH 30.4 07/31/2019   RDW 12.1 07/31/2019   PLT 224 07/31/2019   Lab Results  Component Value Date   LABURIC 7.3 97/58/8325   Last metabolic panel Lab Results  Component Value Date   GLUCOSE 100 (H) 05/29/2021   NA 139 05/29/2021   K 4.0 05/29/2021   CL 101 05/29/2021   CO2 28 05/29/2021   BUN 21 05/29/2021   CREATININE 1.07 05/29/2021   GFRNONAA 80 09/04/2019   CALCIUM 9.7 05/29/2021   PROT 6.9 11/11/2020   ALBUMIN 4.4 11/11/2020   LABGLOB 2.5 09/04/2019   AGRATIO 1.9 09/04/2019   BILITOT 0.6 11/11/2020   ALKPHOS 71 11/11/2020   AST 22 01/13/2021   ALT 42 01/13/2021   ANIONGAP 9 07/31/2019   Last lipids Lab Results  Component Value Date   CHOL 220 (H) 01/13/2021   HDL 39.90 01/13/2021    LDLCALC 147 (H) 01/13/2021   TRIG 164.0 (H) 01/13/2021   CHOLHDL 6 01/13/2021   Last hemoglobin A1c Lab Results  Component Value Date   HGBA1C 5.5 11/11/2020   Last thyroid functions Lab Results  Component Value Date   TSH 2.38 11/11/2020   IMPRESSION AND PLAN:  #1 hypertension, well controlled on losartan 50 mg a day. Electrolytes and creatinine today.  2.  Hyperlipidemia.  Patient suspects that rosuvastatin elicits toe pain/gout and right foot. History seems more compatible with idiopathic gout flares, unrelated to statin. However, agree with cardiologist recent referral to their lipid clinic pharmacologist. Continue Zetia 10 mg a day--refilled today. Hepatic panel today. He will be getting fasting lipid panel with the lipid clinic.  #3 insomnia.  Good response to Lunesta 2 mg nightly. He feels like his impaired sleep may be related to uncontrolled sleep apnea.  He has follow-up with his pulmonologist soon. Refilled Lunesta 2 mg, 1 nightly as needed, #90, refill x 1.  An After Visit Summary was printed and given to the patient.  FOLLOW UP: Return in about 6 months (around 02/22/2022) for annual CPE (fasting).  Signed:  Crissie Sickles, MD           08/23/2021

## 2021-08-25 ENCOUNTER — Other Ambulatory Visit: Payer: 59

## 2021-08-25 ENCOUNTER — Telehealth: Payer: Self-pay | Admitting: Pulmonary Disease

## 2021-08-25 NOTE — Telephone Encounter (Signed)
I called the patient as we would need to re-schedule his appointment for 08/28/2021. He would need to be a new patient consult on another day. Waiting on a call back.

## 2021-08-28 ENCOUNTER — Ambulatory Visit: Payer: 59 | Admitting: Pulmonary Disease

## 2021-09-05 ENCOUNTER — Ambulatory Visit: Payer: 59 | Admitting: Pulmonary Disease

## 2021-09-07 ENCOUNTER — Ambulatory Visit (INDEPENDENT_AMBULATORY_CARE_PROVIDER_SITE_OTHER): Payer: 59 | Admitting: Pharmacist

## 2021-09-07 DIAGNOSIS — E782 Mixed hyperlipidemia: Secondary | ICD-10-CM

## 2021-09-07 MED ORDER — PRAVASTATIN SODIUM 20 MG PO TABS: 20.0000 mg | ORAL_TABLET | Freq: Every evening | ORAL | 3 refills | Status: DC

## 2021-09-07 NOTE — Progress Notes (Signed)
Patient ID: Cody Cruz                 DOB: 1976-02-10                    MRN: [id number]     HPI: Cody Cruz is a 46 y.o. male patient referred to lipid clinic by Dr. Johnsie Cancel. PMH is significant for HTN, OSA, HLD, atypical chest pain, and obesity. Pt was hospitalized 07/31/19 for chest pain, but Lexiscan myovue with no ischemia EF 53% and TTE with normal EF with no valve disease. Pt was prescribed a statin at discharge. CAC score 10/25/19 was 0. Pt was last seen by Dr. Johnsie Cancel on 06/06/21, at which time pt reported gout flares, so rosuvastatin 5 mg daily was changed to rosuvastatin 5 mg three days per week. Pt experienced toe pain again 6 weeks after restarting rosuvastatin, and the pain resolved after stopping rosuvastatin. Pt has tolerated Zetia 10 mg daily, but it has only resulted in modest LDL reductions. Baseline LDL is 182. Pt was referred to lipid clinic for statin intolerance.  Pt presents today in good spirits. Pt verified his history of statin use. Pt associates rosuvastatin therapy with his gout flares and reports no history of gout prior to statin therapy. Pt is not interested in maintenance treatment for gout nor re-trialing rosuvastatin. Pt denies any chest pain since his hospitalization. Pt also denies headache, palpitation, and shortness of breath. Pt reported a family history of premature ASCVD on his mother's side including MI in his grandfather (50-55 years) and multiple uncles (50-55 years). Pt has healthy diet and exercise habits.  Current Medications: Zetia 10 mg daily Intolerances: rosuvastatin 5 mg 3 days per week (reported gout flare) Risk Factors: FHx of premature MI, MESA ASCVD risk score of 4.3% LDL goal: <100  Diet: Pt reports fairly healthy diet. Pt does enjoy occasional red meat and consumes alcohol on occasion. Knowledgeable about diet management of gout and cholesterol.  Exercise: Walk/jogs 10 miles per week with strength training 2 days per week  Family  History: Hyperlipidemia in mother and father, MI at <55 years in his grandfather and multiple uncles  Social History: Never smoker, 3-4 beers per week  Labs:  Lipid panel 01/13/21 on Zetia 10 mg daily: TC 220, TG 164, HDL 39.9, VLDL 32.8, LDL 147, non HDL 180  Past Medical History:  Diagnosis Date   Chest pain 07/2019   ACS ruled out. Stress test and echo normal.  BP was out of control at the time of his eval/hosp.   Esophageal abnormality 08/2019   mildly dilated distal esophagus + food debris in esoph noted on coronary CT calcium scoring imaging--refer to GI for eval for possible esoph dysmotility--pt declined this as of 08/2019.   Essential hypertension    Gout    toes   Hyperlipidemia    statins->gout?   Insomnia    lunesta helpful   Mild intermittent asthma    activity induced, typically.  Allergies trigger as a kid.   Obesity, Class II, BMI 35-39.9    OSA on CPAP    [facility]- uses cpap nightly   Plantar fasciitis 01/2019   steroid inj, Dr. Paulla Dolly   Pulmonary nodules 08/2019   incidentally noted on coronary calcium scoring imaging-> enlarging nodule on 1 yr f/u imaging->pulm eval->LN bx no malignant cells, bronch washings/lavage with some atypical cells favored to be reactive, no malignant cells.    Current Outpatient Medications on File  Prior to Visit  Medication Sig Dispense Refill   albuterol (VENTOLIN HFA) 108 (90 Base) MCG/ACT inhaler Inhale 2 puffs into the lungs every 6 (six) hours as needed for shortness of breath. (Patient not taking: Reported on 06/13/2021)     aspirin EC 81 MG EC tablet Take 1 tablet (81 mg total) by mouth daily. 150 tablet 3   eszopiclone (LUNESTA) 2 MG TABS tablet Take 1 tablet (2 mg total) by mouth at bedtime as needed for sleep. Take immediately before bedtime 90 tablet 1   ezetimibe (ZETIA) 10 MG tablet Take 1 tablet (10 mg total) by mouth daily. 90 tablet 1   indomethacin (INDOCIN) 50 MG capsule 1 tab po tid prn pain 30 capsule 1    losartan (COZAAR) 50 MG tablet Take 1 tablet (50 mg total) by mouth daily. 90 tablet 3   Omega-3 Fatty Acids (OMEGA-3 FISH OIL) 500 MG CAPS Take 500 mg by mouth.     No current facility-administered medications on file prior to visit.    Allergies  Allergen Reactions   Statins Other (See Comments)    ?gout    Assessment/Plan:  1. Hyperlipidemia - LDL of 147 on Zetia 10 mg daily is above goal of <100 give family history of premature ASCVD. Pt was educated on diet and exercise recommendations for gout and lipid management. Pt is motivated and making healthy lifestyle choices already, so was encouraged to continue with those good habits. Pt is willing to trial a different statin. Will start pravastatin 20 mg daily and continue Zetia 10 mg daily. Pt scheduled for lipid panel, hepatic panel, and advanced lipid panel in 2 months given family history of CAD. Pt encouraged to reach out with any questions or concerns.    Patient seen with Reatha Harps, PharmD  Megan E. Supple, PharmD, BCACP, [facility] [address] [place], [place] [address] Phone: [phone]; Fax: [phone] 09/07/2021 9:45 AM

## 2021-09-07 NOTE — Patient Instructions (Signed)
Start pravastatin 20 mg daily

## 2021-11-03 ENCOUNTER — Other Ambulatory Visit: Payer: 59

## 2021-11-03 DIAGNOSIS — E782 Mixed hyperlipidemia: Secondary | ICD-10-CM

## 2021-11-04 LAB — HEPATIC FUNCTION PANEL
ALT: 51 IU/L — ABNORMAL HIGH (ref 0–44)
AST: 32 IU/L (ref 0–40)
Albumin: 5 g/dL (ref 4.1–5.1)
Alkaline Phosphatase: 89 IU/L (ref 44–121)
Bilirubin Total: 0.3 mg/dL (ref 0.0–1.2)
Bilirubin, Direct: 0.12 mg/dL (ref 0.00–0.40)
Total Protein: 7.2 g/dL (ref 6.0–8.5)

## 2021-11-04 LAB — LIPOPROTEIN A (LPA): Lipoprotein (a): 416.1 nmol/L — ABNORMAL HIGH (ref <75.0)

## 2021-11-04 LAB — APOLIPOPROTEIN B: Apolipoprotein B: 116 mg/dL — ABNORMAL HIGH (ref <90)

## 2021-11-04 LAB — NMR, LIPOPROFILE
Cholesterol, Total: 191 mg/dL (ref 100–199)
HDL Particle Number: 29.1 umol/L — ABNORMAL LOW (ref >=30.5)
HDL-C: 37 mg/dL — ABNORMAL LOW (ref >39)
LDL Particle Number: 1634 nmol/L — ABNORMAL HIGH (ref <1000)
LDL Size: 20.9 nm (ref >20.5)
LDL-C (NIH Calc): 112 mg/dL — ABNORMAL HIGH (ref 0–99)
LP-IR Score: 100 — ABNORMAL HIGH (ref <=45)
Small LDL Particle Number: 604 nmol/L — ABNORMAL HIGH (ref <=527)
Triglycerides: 242 mg/dL — ABNORMAL HIGH (ref 0–149)

## 2021-11-04 LAB — LIPID PANEL
Chol/HDL Ratio: 5 ratio (ref 0.0–5.0)
Cholesterol, Total: 196 mg/dL (ref 100–199)
HDL: 39 mg/dL — ABNORMAL LOW (ref >39)
LDL Chol Calc (NIH): 115 mg/dL — ABNORMAL HIGH (ref 0–99)
Triglycerides: 242 mg/dL — ABNORMAL HIGH (ref 0–149)
VLDL Cholesterol Cal: 42 mg/dL — ABNORMAL HIGH (ref 5–40)

## 2021-11-06 ENCOUNTER — Telehealth: Payer: Self-pay

## 2021-11-06 DIAGNOSIS — I10 Essential (primary) hypertension: Secondary | ICD-10-CM

## 2021-11-06 DIAGNOSIS — R079 Chest pain, unspecified: Secondary | ICD-10-CM

## 2021-11-06 DIAGNOSIS — E782 Mixed hyperlipidemia: Secondary | ICD-10-CM

## 2021-11-06 NOTE — Telephone Encounter (Signed)
-----   Message from Josue Hector, MD sent at 11/03/2021  6:21 PM EDT ----- LDL better 147-> 115 but still above goal increase pravastatin to 40 mg f/u with pharm D Limit fat intake repeat labs in 3 months

## 2021-11-06 NOTE — Telephone Encounter (Signed)
Patient aware of results and will call us to make appointment.

## 2021-11-07 ENCOUNTER — Encounter: Payer: Self-pay | Admitting: Family Medicine

## 2021-11-09 ENCOUNTER — Ambulatory Visit: Payer: 59 | Admitting: Pulmonary Disease

## 2021-11-10 ENCOUNTER — Other Ambulatory Visit: Payer: 59

## 2021-12-11 ENCOUNTER — Encounter: Payer: Self-pay | Admitting: Pharmacist

## 2021-12-11 ENCOUNTER — Ambulatory Visit: Payer: 59 | Attending: Interventional Cardiology | Admitting: Pharmacist

## 2021-12-11 DIAGNOSIS — E7800 Pure hypercholesterolemia, unspecified: Secondary | ICD-10-CM | POA: Diagnosis not present

## 2021-12-11 MED ORDER — PRAVASTATIN SODIUM 40 MG PO TABS: 40.0000 mg | ORAL_TABLET | Freq: Every day | ORAL | 1 refills | Status: DC

## 2021-12-11 NOTE — Patient Instructions (Addendum)
It was nice meeting you today  We would like your LDL (bad cholesterol) to be less than 70  We will increase your pravastatin to '40mg'$  once a day  Your lab orders have been placed, please come by the clinic on 11/27 to have drawn  Please give [place] a call with any adverse effects  Continue your Zetia '10mg'$  at this time  Karren Cobble, PharmD, [place], [place], [place], [place], [place], [address] Phone: [phone], Fax: [phone]

## 2021-12-11 NOTE — Progress Notes (Signed)
Patient ID: MAKALE PINDELL                 DOB: 20-Oct-1975                    MRN: [id number]      HPI: Cody Cruz is a 46 y.o. male patient referred to lipid clinic by Dr Johnsie Cancel. PMH is significant for HTN, HLD, family history of CAD and statin induced gout flares.  At first visit with PharmD, patient reported gout flares on rosuvastatin so medication was switched to pravastatin '20mg'$ .  Repeat lipid panel showed improvement in LDL but still above goal. Pravastatin was supposed to be increased to '40mg'$  but patient reports he was not sent in a new Rx.  Repeat lab orders were placed.  Patient presents today. Has not increased pravastatin yet but is tolerating well. No myalgias or gout flares. Did not know lab orders had been placed. Remains on Zetia '10mg'$  without adverse effects.  Current Medications:  Pravastatin '20mg'$  Zetia '10mg'$  daily  Intolerances:  Rosuvastatin  Risk Factors:  Family History  Labs: LPA 416.1,  APO b 116, LDL 112. Trigs 242, LDL particle number 1634, HDL 37, TC 191 (on pravastatin '20mg'$  - 11/03/21)  Past Medical History:  Diagnosis Date   Chest pain 07/2019   ACS ruled out. Stress test and echo normal.  BP was out of control at the time of his eval/hosp.   Esophageal abnormality 08/2019   mildly dilated distal esophagus + food debris in esoph noted on coronary CT calcium scoring imaging--refer to GI for eval for possible esoph dysmotility--pt declined this as of 08/2019.   Essential hypertension    Gout    toes   Hyperlipidemia    statins->gout?  ok on pravastatin 2023   Insomnia    lunesta helpful   Mild intermittent asthma    activity induced, typically.  Allergies trigger as a kid.   Obesity, Class II, BMI 35-39.9    OSA on CPAP    [facility] pul[facility]- uses cpap nightly   Plantar fasciitis 01/2019   steroid inj, Dr. Paulla Dolly   Pulmonary nodules 08/2019   incidentally noted on coronary calcium scoring imaging-> enlarging nodule on 1 yr f/u imaging->pulm eval->LN  bx no malignant cells, bronch washings/lavage with some atypical cells favored to be reactive, no malignant cells.    Current Outpatient Medications on File Prior to Visit  Medication Sig Dispense Refill   albuterol (VENTOLIN HFA) 108 (90 Base) MCG/ACT inhaler Inhale 2 puffs into the lungs every 6 (six) hours as needed for shortness of breath. (Patient not taking: Reported on 06/13/2021)     aspirin EC 81 MG EC tablet Take 1 tablet (81 mg total) by mouth daily. 150 tablet 3   eszopiclone (LUNESTA) 2 MG TABS tablet Take 1 tablet (2 mg total) by mouth at bedtime as needed for sleep. Take immediately before bedtime 90 tablet 1   ezetimibe (ZETIA) 10 MG tablet Take 1 tablet (10 mg total) by mouth daily. 90 tablet 1   indomethacin (INDOCIN) 50 MG capsule 1 tab po tid prn pain 30 capsule 1   losartan (COZAAR) 50 MG tablet Take 1 tablet (50 mg total) by mouth daily. 90 tablet 3   Omega-3 Fatty Acids (OMEGA-3 FISH OIL) 500 MG CAPS Take 500 mg by mouth.     pravastatin (PRAVACHOL) 20 MG tablet Take 1 tablet (20 mg total) by mouth every evening. 90 tablet 3   No current  facility-administered medications on file prior to visit.    Allergies  Allergen Reactions   Statins Other (See Comments)    ?gout    Assessment/Plan:  1. Hyperlipidemia - Patient's most recent LDL 112 which is improved but still above goal. Since patient did not increase pravastatin, will increase to '40mg'$  today. Advised that dose can go up to '80mg'$  however he should let us know if he has any gout flares or muscle pains.  Described next step would likely be [id number].  Since he was present, educated patient on how to use demo pen.  Set repeat lab order for late November.  Increase pravastatin to '40mg'$  daily Continue Zetia '10mg'$  daily Recheck lipid panel in 2-3 months  Karren Cobble, PharmD, BCACP, [place], [place] [address] [place], [place] [address] Phone: [phone]; Fax: [phone] 12/11/2021 9:05 AM

## 2021-12-12 ENCOUNTER — Ambulatory Visit (INDEPENDENT_AMBULATORY_CARE_PROVIDER_SITE_OTHER): Payer: 59 | Admitting: Pulmonary Disease

## 2021-12-12 ENCOUNTER — Encounter: Payer: Self-pay | Admitting: Pulmonary Disease

## 2021-12-12 VITALS — BP 114/76 | HR 75 | Temp 98.3°F | Ht 73.0 in | Wt 301.0 lb

## 2021-12-12 DIAGNOSIS — G4733 Obstructive sleep apnea (adult) (pediatric): Secondary | ICD-10-CM | POA: Diagnosis not present

## 2021-12-12 DIAGNOSIS — Z9989 Dependence on other enabling machines and devices: Secondary | ICD-10-CM

## 2021-12-12 NOTE — Patient Instructions (Signed)
DME referral for new CPAP  Auto CPAP 7-12 with heated humidification  Follow-up in about 6 months  Continue Lunesta as needed  Call with significant concerns

## 2021-12-12 NOTE — Progress Notes (Signed)
Cody Cruz    [id number]    Feb 12, 1976  Primary Care Physician:McGowen, Adrian Blackwater, MD  Referring Physician: Tammi Sou, MD [address] [place],  [place] [address]  Chief complaint:   History of obstructive sleep apnea Multiple awakenings at night  HPI:  Waking up multiple times with dryness Feels the humidification is not working well any longer Machine shows that it is locked out of the humidification settings Device is about 48 years old  He cannot sleep without CPAP  Usually goes to bed between 10 and 1030 Wakes up about 6 AM Still with daytime fatigue and sleepiness  Uses Lunesta probably 3 or 4 days a week to help with sleep  No identifiable environmental factors contributing to awakenings Does not have significant musculoskeletal pain  Weight remains about the same  Pets: dogs Smoking history:non Smoker  Outpatient Encounter Medications as of 12/12/2021  Medication Sig   aspirin EC 81 MG EC tablet Take 1 tablet (81 mg total) by mouth daily.   eszopiclone (LUNESTA) 2 MG TABS tablet Take 1 tablet (2 mg total) by mouth at bedtime as needed for sleep. Take immediately before bedtime   ezetimibe (ZETIA) 10 MG tablet Take 1 tablet (10 mg total) by mouth daily.   indomethacin (INDOCIN) 50 MG capsule 1 tab po tid prn pain   losartan (COZAAR) 50 MG tablet Take 1 tablet (50 mg total) by mouth daily.   Omega-3 Fatty Acids (OMEGA-3 FISH OIL) 500 MG CAPS Take 500 mg by mouth.   pravastatin (PRAVACHOL) 40 MG tablet Take 1 tablet (40 mg total) by mouth daily.   No facility-administered encounter medications on file as of 12/12/2021.    Allergies as of 12/12/2021 - Review Complete 12/12/2021  Allergen Reaction Noted   Statins Other (See Comments) 11/11/2020    Past Medical History:  Diagnosis Date   Chest pain 07/2019   ACS ruled out. Stress test and echo normal.  BP was out of control at the time of his eval/hosp.   Esophageal abnormality  08/2019   mildly dilated distal esophagus + food debris in esoph noted on coronary CT calcium scoring imaging--refer to GI for eval for possible esoph dysmotility--pt declined this as of 08/2019.   Essential hypertension    Gout    toes   Hyperlipidemia    statins->gout?  ok on pravastatin 2023   Insomnia    lunesta helpful   Mild intermittent asthma    activity induced, typically.  Allergies trigger as a kid.   Obesity, Class II, BMI 35-39.9    OSA on CPAP    [facility] pul[facility]- uses cpap nightly   Plantar fasciitis 01/2019   steroid inj, Dr. Paulla Dolly   Pulmonary nodules 08/2019   incidentally noted on coronary calcium scoring imaging-> enlarging nodule on 1 yr f/u imaging->pulm eval->LN bx no malignant cells, bronch washings/lavage with some atypical cells favored to be reactive, no malignant cells.    Past Surgical History:  Procedure Laterality Date   APPENDECTOMY  1989   BRONCHIAL BRUSHINGS  09/27/2020   Procedure: BRONCHIAL BRUSHINGS;  Surgeon: [name], DO;  Location: [facility];  Service: Pulmonary;;   BRONCHIAL NEEDLE ASPIRATION BIOPSY  09/27/2020   Procedure: BRONCHIAL NEEDLE ASPIRATION BIOPSIES;  Surgeon: [name], DO;  Location: [facility];  Service: Pulmonary;;   BRONCHIAL WASHINGS  09/27/2020   Procedure: BRONCHIAL WASHINGS;  Surgeon: [name], DO;  Location: [facility];  Service: Pulmonary;;   CARDIOVASCULAR STRESS TEST  07/31/2019   NO ISCHEMIA   coronary ct  08/2019   calcium score ZERO.     TONSILLECTOMY AND ADENOIDECTOMY  1979   TRANSTHORACIC ECHOCARDIOGRAM  07/31/2019   normal   VIDEO BRONCHOSCOPY WITH ENDOBRONCHIAL NAVIGATION Right 09/27/2020   Procedure: VIDEO BRONCHOSCOPY WITH ENDOBRONCHIAL NAVIGATION;  Surgeon: [name], DO;  Location: [facility];  Service: Pulmonary;  Laterality: Right;   VIDEO BRONCHOSCOPY WITH ENDOBRONCHIAL ULTRASOUND N/A 09/27/2020   Procedure: VIDEO BRONCHOSCOPY WITH ENDOBRONCHIAL ULTRASOUND;  Surgeon:  [name], DO;  Location: [facility];  Service: Pulmonary;  Laterality: N/A;   WISDOM TOOTH EXTRACTION      Family History  Problem Relation Age of Onset   Colon polyps Mother    Arthritis Mother    Hyperlipidemia Mother    Hypertension Mother    Colon cancer Father 77   Hypertension Father    CAD Maternal Uncle 69 - 7   Colon cancer Paternal Uncle 71   CAD Maternal Grandfather 56 - 55   Esophageal cancer Neg Hx    Rectal cancer Neg Hx    Stomach cancer Neg Hx     Social History   Socioeconomic History   Marital status: Married    Spouse name: Not on file   Number of children: Not on file   Years of education: Not on file   Highest education level: Master's degree (e.g., MA, MS, MEng, MEd, MSW, MBA)  Occupational History   Not on file  Tobacco Use   Smoking status: Never   Smokeless tobacco: Never  Vaping Use   Vaping Use: Never used  Substance and Sexual Activity   Alcohol use: Yes    Comment: 10-14   Drug use: No   Sexual activity: Not on file  Other Topics Concern   Not on file  Social History Narrative   Married, two children (one son and one daughter).   Education: [name].   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   No tobacco.  Alc 3-4 beers per week avg.  No alc/drugs.   Exercise: CV and wt's at a GYM 3-5 days a week when feet are not hurting him.   Social Determinants of Health   Financial Resource Strain: Low Risk  (08/21/2021)   Overall Financial Resource Strain (CARDIA)    Difficulty of Paying Living Expenses: Not hard at all  Food Insecurity: No Food Insecurity (08/21/2021)   Hunger Vital Sign    Worried About Running Out of Food in the Last Year: Never true    Ran Out of Food in the Last Year: Never true  Transportation Needs: No Transportation Needs (08/21/2021)   PRAPARE - Hydrologist (Medical): No    Lack of Transportation (Non-Medical): No  Physical Activity: Sufficiently Active  (08/21/2021)   Exercise Vital Sign    Days of Exercise per Week: 3 days    Minutes of Exercise per Session: 60 min  Stress: No Stress Concern Present (08/21/2021)   [place]    Feeling of Stress : Only a little  Social Connections: Moderately Isolated (08/21/2021)   Social Connection and Isolation Panel [NHANES]    Frequency of Communication with Friends and Family: Twice a week    Frequency of Social Gatherings with Friends and Family: Once a week    Attends Religious Services: Never    Retail buyer of [employer]  or Organizations: No    Attends Archivist Meetings: Not on file    Marital Status: Married  Human resources officer Violence: Not on file    Review of Systems  Constitutional:  Positive for fatigue.  Eyes: Negative.   Respiratory:  Positive for apnea. Negative for shortness of breath.   Endocrine: Negative.   Skin: Negative.   Psychiatric/Behavioral:  Positive for sleep disturbance.   All other systems reviewed and are negative.   Vitals:   12/12/21 1356  BP: 114/76  Pulse: 75  Temp: 98.3 F (36.8 C)  SpO2: 94%     Physical Exam Constitutional:      General: He is not in acute distress.    Appearance: He is well-developed. He is obese. He is not diaphoretic.     Comments: Obese  HENT:     Head: Normocephalic and atraumatic.  Eyes:     General:        Right eye: No discharge.        Left eye: No discharge.     Conjunctiva/sclera: Conjunctivae normal.     Pupils: Pupils are equal, round, and reactive to light.  Neck:     Thyroid: No thyromegaly.     Trachea: No tracheal deviation.  Cardiovascular:     Rate and Rhythm: Normal rate and regular rhythm.  Pulmonary:     Effort: No respiratory distress.     Breath sounds: Normal breath sounds. No wheezing.  Abdominal:     General: Bowel sounds are normal. There is no distension.     Palpations: Abdomen is soft.  Musculoskeletal:     Cervical  back: Normal range of motion and neck supple.  Neurological:     Mental Status: He is alert.    Data Reviewed: Sleep study results reviewed  Compliance reviewed showing 100% compliance Average use of 7 hours 27 minutes AutoSet 7-12 95 percentile pressure of 10.7 Residual AHI 1.0   Assessment:  Obstructive sleep apnea -Excellent compliance -Appears adequately treated  Multiple awakenings likely multifactorial -Dryness definitely contributing to it  Appears machine is getting dysfunctional and needs new machine  History of lung nodules, adenopathy-being followed with CT  Plan/Recommendations:  DME referral for new CPAP  Auto CPAP 7-12  Follow-up in about 6 months     Sherrilyn Rist MD [facility] 12/12/2021, 2:13 PM  CC: McGowen, Adrian Blackwater, MD

## 2022-01-04 ENCOUNTER — Encounter: Payer: Self-pay | Admitting: Pulmonary Disease

## 2022-01-04 NOTE — Telephone Encounter (Signed)
PCC's please advise on pt's message about [facility] not accepting his insurance. Thanks.

## 2022-01-04 NOTE — Telephone Encounter (Signed)
Terri C from [facility] called me back about this Cpap order. They do have the Cpap order and have left a message for the patient.  Terri stated they needed to make sure they had all the correct documentation and would set the patient up.  I have left a message for the patient letting him know the [facility] did forward his Cpap order to [facility] and they would be contacting him

## 2022-01-04 NOTE — Telephone Encounter (Signed)
I called and spoke with Amy at [facility] and she confirmed that they will no longer be dealing with Cpaps.  Per their notes Ivin Booty forwarded the Cpap order to [facility]. I have called [facility] to try and check to see if they got the order.  I had to leave a message with Jeanett Schlein.

## 2022-01-07 ENCOUNTER — Other Ambulatory Visit: Payer: Self-pay | Admitting: Cardiovascular Disease

## 2022-02-12 ENCOUNTER — Ambulatory Visit: Payer: 59 | Attending: Cardiovascular Disease

## 2022-02-12 DIAGNOSIS — I10 Essential (primary) hypertension: Secondary | ICD-10-CM

## 2022-02-12 DIAGNOSIS — E782 Mixed hyperlipidemia: Secondary | ICD-10-CM

## 2022-02-12 DIAGNOSIS — R079 Chest pain, unspecified: Secondary | ICD-10-CM

## 2022-02-13 LAB — LIPID PANEL
Chol/HDL Ratio: 4.3 ratio (ref 0.0–5.0)
Cholesterol, Total: 187 mg/dL (ref 100–199)
HDL: 43 mg/dL (ref >39)
LDL Chol Calc (NIH): 118 mg/dL — ABNORMAL HIGH (ref 0–99)
Triglycerides: 145 mg/dL (ref 0–149)
VLDL Cholesterol Cal: 26 mg/dL (ref 5–40)

## 2022-02-13 LAB — HEPATIC FUNCTION PANEL
ALT: 47 IU/L — ABNORMAL HIGH (ref 0–44)
AST: 30 IU/L (ref 0–40)
Albumin: 4.7 g/dL (ref 4.1–5.1)
Alkaline Phosphatase: 92 IU/L (ref 44–121)
Bilirubin Total: 0.6 mg/dL (ref 0.0–1.2)
Bilirubin, Direct: 0.15 mg/dL (ref 0.00–0.40)
Total Protein: 6.9 g/dL (ref 6.0–8.5)

## 2022-02-16 ENCOUNTER — Encounter: Payer: Self-pay | Admitting: Pulmonary Disease

## 2022-02-16 ENCOUNTER — Other Ambulatory Visit: Payer: Self-pay | Admitting: Family Medicine

## 2022-02-16 ENCOUNTER — Telehealth: Payer: Self-pay | Admitting: Cardiovascular Disease

## 2022-02-16 DIAGNOSIS — E782 Mixed hyperlipidemia: Secondary | ICD-10-CM

## 2022-02-16 DIAGNOSIS — I10 Essential (primary) hypertension: Secondary | ICD-10-CM

## 2022-02-16 DIAGNOSIS — E7800 Pure hypercholesterolemia, unspecified: Secondary | ICD-10-CM

## 2022-02-16 NOTE — Telephone Encounter (Signed)
Pt is returning call in regards to labs. Transferred to [facility], RN.

## 2022-02-16 NOTE — Telephone Encounter (Signed)
-----   Message from Josue Hector, MD sent at 02/13/2022  7:55 AM EST ----- ? Has he been taking 40 mg pravastatin not much change compared to 20 mg dosing Continue Zetia repeat labs in 3 months if still above goal increase to 80 mg daily

## 2022-02-16 NOTE — Telephone Encounter (Signed)
The patient has been notified of the result and verbalized understanding.  All questions (if any) were answered. Michaelyn Barter, RN 02/16/2022 11:32 AM   Placed order for lab work.

## 2022-03-12 ENCOUNTER — Other Ambulatory Visit: Payer: Self-pay | Admitting: Family Medicine

## 2022-03-28 ENCOUNTER — Telehealth: Payer: 59 | Admitting: Family

## 2022-03-28 DIAGNOSIS — J4521 Mild intermittent asthma with (acute) exacerbation: Secondary | ICD-10-CM | POA: Diagnosis not present

## 2022-03-28 MED ORDER — ALBUTEROL SULFATE HFA 108 (90 BASE) MCG/ACT IN AERS: 2.0000 | INHALATION_SPRAY | Freq: Four times a day (QID) | RESPIRATORY_TRACT | 0 refills | Status: AC | PRN

## 2022-03-28 MED ORDER — AZITHROMYCIN 250 MG PO TABS: ORAL_TABLET | ORAL | 0 refills | Status: DC

## 2022-03-28 MED ORDER — PREDNISONE 10 MG (21) PO TBPK: ORAL_TABLET | ORAL | 0 refills | Status: DC

## 2022-03-28 NOTE — Progress Notes (Signed)
Virtual Visit Consent   Cody Cruz, you are scheduled for a virtual visit with a [facility] provider today. Just as with appointments in the office, your consent must be obtained to participate. Your consent will be active for this visit and any virtual visit you may have with one of our providers in the next 365 days. If you have a MyChart account, a copy of this consent can be sent to you electronically.  As this is a virtual visit, video technology does not allow for your provider to perform a traditional examination. This may limit your provider's ability to fully assess your condition. If your provider identifies any concerns that need to be evaluated in person or the need to arrange testing (such as labs, EKG, etc.), we will make arrangements to do so. Although advances in technology are sophisticated, we cannot ensure that it will always work on either your end or our end. If the connection with a video visit is poor, the visit may have to be switched to a telephone visit. With either a video or telephone visit, we are not always able to ensure that we have a secure connection.  By engaging in this virtual visit, you consent to the provision of healthcare and authorize for your insurance to be billed (if applicable) for the services provided during this visit. Depending on your insurance coverage, you may receive a charge related to this service.  I need to obtain your verbal consent now. Are you willing to proceed with your visit today? Cody Cruz has provided verbal consent on 03/28/2022 for a virtual visit (video or telephone). Evelina Dun, FNP  Date: 03/28/2022 9:30 AM  Virtual Visit via Video Note   I, Evelina Dun, connected with  Cody Cruz  ([id number], 06-22-1975) on 03/28/22 at  9:30 AM EST by a video-enabled telemedicine application and verified that I am speaking with the correct person using two identifiers.  Location: Patient: Virtual Visit Location Patient:  Home Provider: Virtual Visit Location Provider: Home Office   I discussed the limitations of evaluation and management by telemedicine and the availability of in person appointments. The patient expressed understanding and agreed to proceed.    History of Present Illness: Cody Cruz is a 47 y.o. who identifies as a male who was assigned male at birth, and is being seen today for cough for over the last week.  HPI: Cough This is a new problem. The current episode started 1 to 4 weeks ago. The problem has been gradually worsening. The problem occurs every few minutes. The cough is Non-productive. Associated symptoms include chest pain, ear congestion, headaches, nasal congestion, shortness of breath and wheezing. Pertinent negatives include no chills, ear pain, fever or sore throat. He has tried rest and OTC cough suppressant for the symptoms. The treatment provided mild relief. His past medical history is significant for asthma.    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   Nonallopathic lesion of lower extremities 04/21/2014   Pes planus of both feet 10/30/2013  Health maintenance examination 07/07/2013   Plantar fasciitis, bilateral 07/07/2013   Achilles tendon pain 07/07/2013   Onychomycosis 07/07/2013    Allergies:  Allergies  Allergen Reactions   Statins Other (See Comments)    ?gout   Medications:  Current Outpatient Medications:    albuterol (VENTOLIN HFA) 108 (90 Base) MCG/ACT inhaler, Inhale 2 puffs into the lungs  every 6 (six) hours as needed for wheezing or shortness of breath., Disp: 8 g, Rfl: 0   azithromycin (ZITHROMAX) 250 MG tablet, Take 500 mg once, then 250 mg for four days, Disp: 6 tablet, Rfl: 0   predniSONE (STERAPRED UNI-PAK 21 TAB) 10 MG (21) TBPK tablet, Use as directed, Disp: 21 tablet, Rfl: 0   aspirin EC 81 MG EC tablet, Take 1 tablet (81 mg total) by mouth daily., Disp: 150 tablet, Rfl: 3   eszopiclone (LUNESTA) 2 MG TABS tablet, Take 1 tablet (2 mg total) by mouth at bedtime as needed for sleep. Take immediately before bedtime, Disp: 90 tablet, Rfl: 1   ezetimibe (ZETIA) 10 MG tablet, TAKE 1 TABLET BY MOUTH EVERY DAY, Disp: 30 tablet, Rfl: 0   indomethacin (INDOCIN) 50 MG capsule, 1 tab po tid prn pain, Disp: 30 capsule, Rfl: 1   losartan (COZAAR) 50 MG tablet, TAKE 1 TABLET BY MOUTH EVERY DAY, Disp: 90 tablet, Rfl: 1   Omega-3 Fatty Acids (OMEGA-3 FISH OIL) 500 MG CAPS, Take 500 mg by mouth., Disp: , Rfl:    pravastatin (PRAVACHOL) 40 MG tablet, Take 1 tablet (40 mg total) by mouth daily., Disp: 90 tablet, Rfl: 1  Observations/Objective: Patient is well-developed, well-nourished in no acute distress.  Resting comfortably  at home.  Head is normocephalic, atraumatic.  No labored breathing.  Speech is clear and coherent with logical content.  Patient is alert and oriented at baseline.  Hoarse voice   Assessment and Plan: 1. Mild intermittent asthma with exacerbation - predniSONE (STERAPRED UNI-PAK 21 TAB) 10 MG (21) TBPK tablet; Use as directed  Dispense: 21 tablet; Refill: 0 - albuterol (VENTOLIN HFA) 108 (90 Base) MCG/ACT inhaler; Inhale 2 puffs into the lungs every 6 (six) hours as needed for wheezing or shortness of breath.  Dispense: 8 g; Refill: 0 - azithromycin (ZITHROMAX) 250 MG tablet; Take 500 mg once, then 250 mg for four days  Dispense: 6 tablet; Refill: 0  - Take meds as prescribed - Use a cool mist humidifier  -Use saline nose sprays frequently -Force fluids -For  any cough or congestion  Use plain Mucinex- regular strength or max strength is fine -For fever or aces or pains- take tylenol or ibuprofen. -Throat lozenges if help -Follow up if symptoms worsen or do not improve   Follow Up Instructions: I discussed the assessment and treatment plan with the patient. The patient was provided an opportunity to ask questions and all were answered. The patient agreed with the plan and demonstrated an understanding of the instructions.  A copy of instructions were sent to the patient via MyChart unless otherwise noted below.     The patient was advised to call back or seek an in-person evaluation if the symptoms worsen or if the condition fails to improve as anticipated.  Time:  I spent 8 minutes with the patient via telehealth technology discussing the above problems/concerns.    Evelina Dun, FNP

## 2022-04-09 ENCOUNTER — Other Ambulatory Visit: Payer: Self-pay | Admitting: Family Medicine

## 2022-04-09 ENCOUNTER — Encounter: Payer: Self-pay | Admitting: Family Medicine

## 2022-04-09 MED ORDER — EZETIMIBE 10 MG PO TABS: 10.0000 mg | ORAL_TABLET | Freq: Every day | ORAL | 0 refills | Status: DC

## 2022-05-02 ENCOUNTER — Encounter: Payer: Self-pay | Admitting: Pulmonary Disease

## 2022-05-02 ENCOUNTER — Ambulatory Visit (INDEPENDENT_AMBULATORY_CARE_PROVIDER_SITE_OTHER): Payer: 59 | Admitting: Pulmonary Disease

## 2022-05-02 VITALS — BP 124/82 | HR 71 | Ht 73.0 in | Wt 310.0 lb

## 2022-05-02 DIAGNOSIS — G4733 Obstructive sleep apnea (adult) (pediatric): Secondary | ICD-10-CM

## 2022-05-02 MED ORDER — ZOLPIDEM TARTRATE ER 12.5 MG PO TBCR: 12.5000 mg | EXTENDED_RELEASE_TABLET | Freq: Every evening | ORAL | 2 refills | Status: DC | PRN

## 2022-05-02 NOTE — Progress Notes (Signed)
Cody Cruz    DR:[id number]    01-31-1976  Primary Care Physician:Cruz, Cody Blackwater, MD  Referring Physician: Tammi Sou, MD [address] [place],  [place] [address]  Chief complaint:   History of obstructive sleep apnea Multiple awakenings at night  HPI:  Has been using Lunesta 2 mg at night Still wakes up multiple times at night  Uses his CPAP on a nightly basis and continues to benefit from CPAP use  Cannot sleep without the CPAP Functions well during the day  Usually goes to bed between 10 and 1030 Wakes up about 6 AM Daytime symptoms are better  Uses Lunesta probably 3 or 4 days a week to help with sleep  No identifiable environmental factors contributing to awakenings Does not have significant musculoskeletal pain  Weight remains about the same  Pets: dogs Smoking history:non Smoker  Outpatient Encounter Medications as of 05/02/2022  Medication Sig   albuterol (VENTOLIN HFA) 108 (90 Base) MCG/ACT inhaler Inhale 2 puffs into the lungs every 6 (six) hours as needed for wheezing or shortness of breath.   aspirin EC 81 MG EC tablet Take 1 tablet (81 mg total) by mouth daily.   azithromycin (ZITHROMAX) 250 MG tablet Take 500 mg once, then 250 mg for four days   cetirizine (ZYRTEC) 10 MG chewable tablet Chew 10 mg by mouth daily.   eszopiclone (LUNESTA) 2 MG TABS tablet Take 1 tablet (2 mg total) by mouth at bedtime as needed for sleep. Take immediately before bedtime   ezetimibe (ZETIA) 10 MG tablet Take 1 tablet (10 mg total) by mouth daily.   indomethacin (INDOCIN) 50 MG capsule 1 tab po tid prn pain   losartan (COZAAR) 50 MG tablet TAKE 1 TABLET BY MOUTH EVERY DAY   Omega-3 Fatty Acids (OMEGA-3 FISH OIL) 500 MG CAPS Take 500 mg by mouth.   pravastatin (PRAVACHOL) 40 MG tablet Take 1 tablet (40 mg total) by mouth daily.   predniSONE (STERAPRED UNI-PAK 21 TAB) 10 MG (21) TBPK tablet Use as directed   No facility-administered encounter  medications on file as of 05/02/2022.    Allergies as of 05/02/2022 - Review Complete 05/02/2022  Allergen Reaction Noted   Statins Other (See Comments) 11/11/2020    Past Medical History:  Diagnosis Date   Chest pain 07/2019   ACS ruled out. Stress test and echo normal.  BP was out of control at the time of his eval/hosp.   Esophageal abnormality 08/2019   mildly dilated distal esophagus + food debris in esoph noted on coronary CT calcium scoring imaging--refer to GI for eval for possible esoph dysmotility--pt declined this as of 08/2019.   Essential hypertension    Gout    toes   Hyperlipidemia    statins->gout?  ok on pravastatin 2023   Insomnia    lunesta helpful   Mild intermittent asthma    activity induced, typically.  Allergies trigger as a kid.   Obesity, Class II, BMI 35-39.9    OSA on CPAP    [facility]- uses cpap nightly   Plantar fasciitis 01/2019   steroid inj, Dr. Paulla Dolly   Pulmonary nodules 08/2019   incidentally noted on coronary calcium scoring imaging-> enlarging nodule on 1 yr f/u imaging->pulm eval->LN bx no malignant cells, bronch washings/lavage with some atypical cells favored to be reactive, no malignant cells.    Past Surgical History:  Procedure Laterality Date   APPENDECTOMY  1989   BRONCHIAL BRUSHINGS  09/27/2020   Procedure: BRONCHIAL BRUSHINGS;  Surgeon: [name], DO;  Location: [facility];  Service: Pulmonary;;   BRONCHIAL NEEDLE ASPIRATION BIOPSY  09/27/2020   Procedure: BRONCHIAL NEEDLE ASPIRATION BIOPSIES;  Surgeon: [name], DO;  Location: [facility];  Service: Pulmonary;;   BRONCHIAL WASHINGS  09/27/2020   Procedure: BRONCHIAL WASHINGS;  Surgeon: [name], DO;  Location: [facility];  Service: Pulmonary;;   CARDIOVASCULAR STRESS TEST  07/31/2019   NO ISCHEMIA   coronary ct  08/2019   calcium score ZERO.     TONSILLECTOMY AND ADENOIDECTOMY  1979   TRANSTHORACIC ECHOCARDIOGRAM  07/31/2019   normal   VIDEO  BRONCHOSCOPY WITH ENDOBRONCHIAL NAVIGATION Right 09/27/2020   Procedure: VIDEO BRONCHOSCOPY WITH ENDOBRONCHIAL NAVIGATION;  Surgeon: [name], DO;  Location: [facility];  Service: Pulmonary;  Laterality: Right;   VIDEO BRONCHOSCOPY WITH ENDOBRONCHIAL ULTRASOUND N/A 09/27/2020   Procedure: VIDEO BRONCHOSCOPY WITH ENDOBRONCHIAL ULTRASOUND;  Surgeon: [name], DO;  Location: [facility];  Service: Pulmonary;  Laterality: N/A;   WISDOM TOOTH EXTRACTION      Family History  Problem Relation Age of Onset   Colon polyps Mother    Arthritis Mother    Hyperlipidemia Mother    Hypertension Mother    Colon cancer Father 52   Hypertension Father    CAD Maternal Uncle 57 - 83   Colon cancer Paternal Uncle 37   CAD Maternal Grandfather 33 - 55   Esophageal cancer Neg Hx    Rectal cancer Neg Hx    Stomach cancer Neg Hx     Social History   Socioeconomic History   Marital status: Married    Spouse name: Not on file   Number of children: Not on file   Years of education: Not on file   Highest education level: Master's degree (e.g., MA, MS, MEng, MEd, MSW, MBA)  Occupational History   Not on file  Tobacco Use   Smoking status: Never   Smokeless tobacco: Never  Vaping Use   Vaping Use: Never used  Substance and Sexual Activity   Alcohol use: Yes    Comment: 10-14   Drug use: No   Sexual activity: Not on file  Other Topics Concern   Not on file  Social History Narrative   Married, two children (one son and one daughter).   Education: [name].   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   No tobacco.  Alc 3-4 beers per week avg.  No alc/drugs.   Exercise: CV and wt's at a GYM 3-5 days a week when feet are not hurting him.   Social Determinants of Health   Financial Resource Strain: Low Risk  (08/21/2021)   Overall Financial Resource Strain (CARDIA)    Difficulty of Paying Living Expenses: Not hard at all  Food Insecurity: No Food Insecurity  (08/21/2021)   Hunger Vital Sign    Worried About Running Out of Food in the Last Year: Never true    Ran Out of Food in the Last Year: Never true  Transportation Needs: No Transportation Needs (08/21/2021)   PRAPARE - Hydrologist (Medical): No    Lack of Transportation (Non-Medical): No  Physical Activity: Sufficiently Active (08/21/2021)   Exercise Vital Sign    Days of Exercise per Week: 3 days    Minutes of Exercise per Session: 60 min  Stress: No  Stress Concern Present (08/21/2021)   [place]    Feeling of Stress : Only a little  Social Connections: Moderately Isolated (08/21/2021)   Social Connection and Isolation Panel [NHANES]    Frequency of Communication with Friends and Family: Twice a week    Frequency of Social Gatherings with Friends and Family: Once a week    Attends Religious Services: Never    Marine scientist or Organizations: No    Attends Music therapist: Not on file    Marital Status: Married  Human resources officer Violence: Not on file    Review of Systems  Constitutional:  Positive for fatigue.  Eyes: Negative.   Respiratory:  Positive for apnea. Negative for shortness of breath.   Endocrine: Negative.   Skin: Negative.   Psychiatric/Behavioral:  Positive for sleep disturbance.   All other systems reviewed and are negative.   Vitals:   05/02/22 1355  BP: 124/82  Pulse: 71  SpO2: 96%     Physical Exam Constitutional:      General: He is not in acute distress.    Appearance: He is well-developed. He is obese. He is not diaphoretic.     Comments: Obese  HENT:     Head: Normocephalic and atraumatic.  Eyes:     General:        Right eye: No discharge.        Left eye: No discharge.     Conjunctiva/sclera: Conjunctivae normal.     Pupils: Pupils are equal, round, and reactive to light.  Neck:     Thyroid: No thyromegaly.     Trachea: No  tracheal deviation.  Cardiovascular:     Rate and Rhythm: Normal rate and regular rhythm.  Pulmonary:     Effort: No respiratory distress.     Breath sounds: Normal breath sounds. No wheezing.  Abdominal:     General: Bowel sounds are normal. There is no distension.     Palpations: Abdomen is soft.  Musculoskeletal:     Cervical back: Normal range of motion and neck supple.  Neurological:     Mental Status: He is alert.    Data Reviewed: Sleep study results reviewed  Excellent compliance Residual AHI of 0.6 95 percentile pressure of 10.2    Assessment:  Obstructive sleep apnea - Compliance Appears to be adequately treated at present  Multiple awakenings -Lunesta seems to help but not optimal yet  Continues to tolerate CPAP  History of lung nodules, adenopathy-being followed with CT  Plan/Recommendations:  Continue auto CPAP 7-12  Prescription for Ambien CR 12.5 provided in place of Lunesta  Other options of treatment will be Belsomra or Dayvigo  Follow-up in about 6 months     Sherrilyn Rist MD [facility] 05/02/2022, 2:12 PM  CC: Cruz, Cody Blackwater, MD

## 2022-05-02 NOTE — Patient Instructions (Signed)
Trial with Ambien in place of Lunesta  Continue using your CPAP on a nightly basis  Call us with any significant concerns  Other medications we can try in place of Ambien will be Belsomra, dayviigo

## 2022-05-06 ENCOUNTER — Other Ambulatory Visit: Payer: Self-pay | Admitting: Family Medicine

## 2022-05-07 ENCOUNTER — Encounter: Payer: Self-pay | Admitting: Cardiovascular Disease

## 2022-05-18 ENCOUNTER — Other Ambulatory Visit: Payer: 59

## 2022-05-30 ENCOUNTER — Ambulatory Visit: Payer: 59 | Attending: Cardiovascular Disease

## 2022-05-30 DIAGNOSIS — E7800 Pure hypercholesterolemia, unspecified: Secondary | ICD-10-CM

## 2022-05-30 DIAGNOSIS — I10 Essential (primary) hypertension: Secondary | ICD-10-CM

## 2022-05-30 DIAGNOSIS — E782 Mixed hyperlipidemia: Secondary | ICD-10-CM

## 2022-05-30 LAB — HEPATIC FUNCTION PANEL
ALT: 47 IU/L — ABNORMAL HIGH (ref 0–44)
AST: 32 IU/L (ref 0–40)
Albumin: 4.8 g/dL (ref 4.1–5.1)
Alkaline Phosphatase: 90 IU/L (ref 44–121)
Bilirubin Total: 0.7 mg/dL (ref 0.0–1.2)
Bilirubin, Direct: 0.17 mg/dL (ref 0.00–0.40)
Total Protein: 6.8 g/dL (ref 6.0–8.5)

## 2022-05-30 LAB — LIPID PANEL
Chol/HDL Ratio: 5.1 ratio — ABNORMAL HIGH (ref 0.0–5.0)
Cholesterol, Total: 198 mg/dL (ref 100–199)
HDL: 39 mg/dL — ABNORMAL LOW (ref >39)
LDL Chol Calc (NIH): 126 mg/dL — ABNORMAL HIGH (ref 0–99)
Triglycerides: 185 mg/dL — ABNORMAL HIGH (ref 0–149)
VLDL Cholesterol Cal: 33 mg/dL (ref 5–40)

## 2022-06-01 ENCOUNTER — Encounter (HOSPITAL_COMMUNITY): Payer: Self-pay

## 2022-06-01 ENCOUNTER — Ambulatory Visit (HOSPITAL_COMMUNITY)
Admission: RE | Admit: 2022-06-01 | Discharge: 2022-06-01 | Disposition: A | Discharge status: Home / Self Care | Payer: 59 | Source: Ambulatory Visit | Attending: Pulmonary Disease | Admitting: Pulmonary Disease

## 2022-06-01 ENCOUNTER — Telehealth: Payer: Self-pay

## 2022-06-01 ENCOUNTER — Other Ambulatory Visit: Payer: Self-pay | Admitting: Cardiovascular Disease

## 2022-06-01 DIAGNOSIS — E782 Mixed hyperlipidemia: Secondary | ICD-10-CM

## 2022-06-01 DIAGNOSIS — E7800 Pure hypercholesterolemia, unspecified: Secondary | ICD-10-CM

## 2022-06-01 DIAGNOSIS — I10 Essential (primary) hypertension: Secondary | ICD-10-CM

## 2022-06-01 DIAGNOSIS — R911 Solitary pulmonary nodule: Secondary | ICD-10-CM | POA: Diagnosis present

## 2022-06-01 MED ORDER — SODIUM CHLORIDE (PF) 0.9 % IJ SOLN
INTRAMUSCULAR | Status: AC
  Filled 2022-06-01: qty 50

## 2022-06-01 MED ORDER — IOHEXOL 300 MG/ML  SOLN
75.0000 mL | Freq: Once | INTRAMUSCULAR | Status: AC | PRN
  Administered 2022-06-01: 75 mL via INTRAVENOUS

## 2022-06-01 NOTE — Telephone Encounter (Signed)
The patient has been notified of the result and verbalized understanding.  All questions (if any) were answered. Ewell Poe [place], RN 06/01/2022 5:37 PM   Placed order for calcium score and pharmD.

## 2022-06-01 NOTE — Telephone Encounter (Signed)
-----   Message from Josue Hector, MD sent at 05/30/2022  5:56 PM EDT ----- LDL still above goal. ? Had he been taking pravastatin 40 mg with Zetia F/U with lipid clinic and repeat calcium score  ( previously 0 ) F/U lipid clinic after calcium score to consider going upt to 80 mg on pravastatin or consider PSK9

## 2022-06-08 NOTE — Progress Notes (Signed)
Patty Sermons, please let patient know that I have reviewed CT imaging.  Nodules are stable they have been stable for 21 months.  Presumed benign.  No additional follow-up needed.  He can continue to follow-up in sleep clinic with Dr. Jenetta Downer.  Thanks,  BLI  [name], DO [facility] 06/08/2022 3:46 PM

## 2022-06-27 ENCOUNTER — Other Ambulatory Visit: Payer: Self-pay | Admitting: Cardiovascular Disease

## 2022-06-27 DIAGNOSIS — E7800 Pure hypercholesterolemia, unspecified: Secondary | ICD-10-CM

## 2022-07-08 ENCOUNTER — Other Ambulatory Visit: Payer: Self-pay | Admitting: Cardiovascular Disease

## 2022-07-12 NOTE — Progress Notes (Addendum)
Patient ID: Cody Cruz                 DOB: Dec 03, 1975                    MRN: [id number]     HPI: Cody Cruz is a 47 y.o. male patient referred to lipid clinic by Dr. Eden Emms. PMH is significant for HTN, HLD, OSA, atypical chest pain, and obesity.   Patient was hospitalized on 07/31/2019 for chest pain, but no ischemia was seen on Lexiscan myovue, and echos showed normal EF with no valve disease. CAC score on coronary CT was 0. Patient was prescribed rosuvastatin 5 mg daily at discharge. Patient saw Dr. Eden Emms on 06/06/2021 and reported gout flares with rosuvastatin 5 mg daily, and the dose was subsequently decreased to 5 mg three times per week. He then experienced recurrent toe pain 6 weeks after this dose change, at which point rosuvastatin was stopped and his pain subsided. Patient was placed on ezetimibe 10 mg daily, which resulted in only modest LDL reductions. Patient was referred to lipid clinic.   At first visit with lipid clinic on 09/07/2021, patient expressed no interest in maintenance therapies for gout or retrial with rosuvastatin. He was started on pravastatin 20 mg daily and ezetimibe was continued. LDL was 115 mg/dL on 5/62/1308, so MD advised pravastatin dose increase to 40 mg daily. Notably, Lp(a) at the time was 416 nmol/L.   At next visit with lipid clinic on 12/11/2021, patient reported that he had not increased his pravastatin dose because a new prescription had not been sent in. Dose of pravastatin was again adjusted to 40 mg daily. However, most recent lipid panel on 05/30/2022 shows an LDL of 126 mg/dL.   Today, patient presents for further optimization of LLT. Patient reports taking pravastatin 40 mg daily and tolerating it well, but notes that he ran out of his ezetimibe a few weeks ago and has not been taking it because of that. There have been no recent changes to his diet or physical activity. Patient reports that he has 3-4 gout flares per year (with or without statin  therapy) and is not currently on any gout medications. He is not a candidate for Lp(a) study. Patient had a done earlier this morning and updated CAC score is pending. Patient takes OTC fish oil for joint pain and had not noticed any improvement in symptoms.   Current Medications: pravastatin 40 mg daily, ezetimibe 10 mg daily  Intolerances: rosuvastatin 5 mg three days per week (reported gout flares) Risk Factors: elevated Lp(a), MESA ASCVD risk score 4.3%, HTN, obesity, family history of premature MI  LDL goal: could target <70 mg/dL   Diet: Patient reports fairly healthy diet. Pt does enjoy occasional red meat and consumes alcohol on occasion. Knowledgeable about diet management of gout and cholesterol.   Exercise: Walk/jogs 3-5 miles per day with strength training 2 days per week.  Family History: Hyperlipidemia in mother and father, MI at <55 years in his grandfather and multiple uncles   Social History: Never-smoker, drinks 3-4 beers per week.  Labs: 05/30/2022: TC 198, LDL 126, HDL 39, TG 185 (on pravastatin 40 mg daily, ezetimibe 10 mg daily)  02/12/2022: TC 187, LDL 118, HDL 43, TG 145 (on pravastatin 40 mg daily, ezetimibe 10 mg daily)  11/03/2021: TC 196, LDL 115, HDL 39, TG 242, Lp(a) 416 nmol/L, ApoB 116 mg/dL (on pravastatin 20 mg daily, ezetimibe 10 mg daily)  08/25/2019: CAC score 0  Past Medical History:  Diagnosis Date   Chest pain 07/2019   ACS ruled out. Stress test and echo normal.  BP was out of control at the time of his eval/hosp.   Esophageal abnormality 08/2019   mildly dilated distal esophagus + food debris in esoph noted on coronary CT calcium scoring imaging--refer to GI for eval for possible esoph dysmotility--pt declined this as of 08/2019.   Essential hypertension    Gout    toes   Hyperlipidemia    statins->gout?  ok on pravastatin 2023   Insomnia    lunesta helpful   Mild intermittent asthma    activity induced, typically.  Allergies trigger as a kid.    Obesity, Class II, BMI 35-39.9    OSA on CPAP    [facility]- uses cpap nightly   Plantar fasciitis 01/2019   steroid inj, Dr. Charlsie Merles   Pulmonary nodules 08/2019   incidentally noted on coronary calcium scoring imaging-> enlarging nodule on 1 yr f/u imaging->pulm eval->LN bx no malignant cells, bronch washings/lavage with some atypical cells favored to be reactive, no malignant cells.    Current Outpatient Medications on File Prior to Visit  Medication Sig Dispense Refill   albuterol (VENTOLIN HFA) 108 (90 Base) MCG/ACT inhaler Inhale 2 puffs into the lungs every 6 (six) hours as needed for wheezing or shortness of breath. 8 g 0   aspirin EC 81 MG EC tablet Take 1 tablet (81 mg total) by mouth daily. 150 tablet 3   azithromycin (ZITHROMAX) 250 MG tablet Take 500 mg once, then 250 mg for four days 6 tablet 0   cetirizine (ZYRTEC) 10 MG chewable tablet Chew 10 mg by mouth daily.     ezetimibe (ZETIA) 10 MG tablet TAKE 1 TABLET BY MOUTH EVERY DAY 14 tablet 0   indomethacin (INDOCIN) 50 MG capsule 1 tab po tid prn pain 30 capsule 1   losartan (COZAAR) 50 MG tablet TAKE 1 TABLET BY MOUTH EVERY DAY 30 tablet 0   Omega-3 Fatty Acids (OMEGA-3 FISH OIL) 500 MG CAPS Take 500 mg by mouth.     pravastatin (PRAVACHOL) 40 MG tablet Take 1 tablet (40 mg total) by mouth daily. Please call our office and schedule an appt with DR. Nishan for future refills. Thank you 2nd attempt 15 tablet 0   predniSONE (STERAPRED UNI-PAK 21 TAB) 10 MG (21) TBPK tablet Use as directed 21 tablet 0   zolpidem (AMBIEN CR) 12.5 MG CR tablet Take 1 tablet (12.5 mg total) by mouth at bedtime as needed for sleep. 30 tablet 2   No current facility-administered medications on file prior to visit.    Allergies  Allergen Reactions   Statins Other (See Comments)    ?gout    Assessment/Plan:  1. Hyperlipidemia - Patient presents with LDL 126 mg/dL on pravastatin 40 mg daily and ezetimibe 10 mg daily, above goal < 100 mg/dL.  Patient has experienced gout flares on rosuvastatin 5 mg three times per week in the past, though seems to be tolerating pravastatin 40 mg daily well. He seems receptive to switching out his pravastatin for higher intensity/efficacy statin. Patient was counseled on potential need for PSCK9 inhibitor in the future, depending on the results of his updated CAC from this morning.   - Discontinue pravastatin 40 mg daily - Initiate atorvastatin 80 mg daily  - Continue ezetimibe 10mg  daily - Discontinue OTC fish oil - TG only mildly elevated, has not been improving  his joint pain - Encouraged to continue healthy eating habits and regular physical activity - Follow-up CAC score to re-assess LDL goal  - Recheck lipid panel on 09/24/2022 to evaluate need for further lipid-lowering therapies  Patient seen with Michiel Cowboy, PharmD Candidate  Megan E. Supple, PharmD, BCACP, CPP [facility] [address], [place], [place] [address] Phone: [phone]; Fax: [phone] 07/13/2022 2:00 PM  Addendum: CAC score 1.23, Dr Eden Emms ok with current medication regimen. Called pt and left message letting him know.

## 2022-07-13 ENCOUNTER — Ambulatory Visit
Admission: RE | Admit: 2022-07-13 | Discharge: 2022-07-13 | Disposition: A | Discharge status: Home / Self Care | Payer: 59 | Source: Ambulatory Visit | Attending: Cardiovascular Disease | Admitting: Cardiovascular Disease

## 2022-07-13 ENCOUNTER — Ambulatory Visit (INDEPENDENT_AMBULATORY_CARE_PROVIDER_SITE_OTHER): Payer: 59 | Admitting: Pharmacist

## 2022-07-13 DIAGNOSIS — E782 Mixed hyperlipidemia: Secondary | ICD-10-CM | POA: Diagnosis not present

## 2022-07-13 DIAGNOSIS — I10 Essential (primary) hypertension: Secondary | ICD-10-CM

## 2022-07-13 MED ORDER — ATORVASTATIN CALCIUM 80 MG PO TABS: 80.0000 mg | ORAL_TABLET | Freq: Every day | ORAL | 3 refills | Status: DC

## 2022-07-13 MED ORDER — EZETIMIBE 10 MG PO TABS: 10.0000 mg | ORAL_TABLET | Freq: Every day | ORAL | 3 refills | Status: DC

## 2022-07-13 NOTE — Patient Instructions (Signed)
Your LDL cholesterol was 126 most recently, your goal for now is < 100  Depending on the result of your calcium score from this morning, we may lower your goal to < 70  Stop taking pravastatin and start taking atorvastatin - 1 tablet daily  Resume taking ezetimibe - 1 tablet daily  You can stop your fish oil - limit intake of carbs, sugar, and alcohol to keep your triglycerides controlled  Limit saturated fats (red meat, fried food) to help keep your LDL cholesterol low  Recheck fasting labs on Monday, July 8th any time after 7:30am

## 2022-08-01 ENCOUNTER — Other Ambulatory Visit: Payer: Self-pay | Admitting: Cardiovascular Disease

## 2022-09-01 ENCOUNTER — Other Ambulatory Visit: Payer: Self-pay | Admitting: Cardiovascular Disease

## 2022-09-03 ENCOUNTER — Other Ambulatory Visit: Payer: Self-pay

## 2022-09-24 ENCOUNTER — Ambulatory Visit: Payer: 59

## 2022-09-25 ENCOUNTER — Other Ambulatory Visit: Payer: Self-pay | Admitting: Cardiovascular Disease

## 2022-10-01 ENCOUNTER — Ambulatory Visit: Payer: 59 | Attending: Cardiovascular Disease

## 2022-10-01 DIAGNOSIS — E782 Mixed hyperlipidemia: Secondary | ICD-10-CM

## 2022-10-02 LAB — HEPATIC FUNCTION PANEL
ALT: 46 IU/L — ABNORMAL HIGH (ref 0–44)
AST: 31 IU/L (ref 0–40)
Albumin: 4.6 g/dL (ref 4.1–5.1)
Alkaline Phosphatase: 96 IU/L (ref 44–121)
Bilirubin Total: 0.6 mg/dL (ref 0.0–1.2)
Bilirubin, Direct: 0.19 mg/dL (ref 0.00–0.40)
Total Protein: 6.5 g/dL (ref 6.0–8.5)

## 2022-10-02 LAB — LIPID PANEL
Chol/HDL Ratio: 4.2 ratio (ref 0.0–5.0)
Cholesterol, Total: 155 mg/dL (ref 100–199)
HDL: 37 mg/dL — ABNORMAL LOW (ref >39)
LDL Chol Calc (NIH): 86 mg/dL (ref 0–99)
Triglycerides: 190 mg/dL — ABNORMAL HIGH (ref 0–149)
VLDL Cholesterol Cal: 32 mg/dL (ref 5–40)

## 2022-10-12 ENCOUNTER — Telehealth: Payer: Self-pay

## 2022-10-12 DIAGNOSIS — E782 Mixed hyperlipidemia: Secondary | ICD-10-CM

## 2022-10-12 DIAGNOSIS — Z79899 Other long term (current) drug therapy: Secondary | ICD-10-CM

## 2022-10-12 NOTE — Telephone Encounter (Signed)
Placed orders for lab work and office visit follow-up on same day.

## 2022-10-12 NOTE — Telephone Encounter (Signed)
-----   Message from Charlton Haws sent at 10/03/2022  5:49 PM EDT ----- That's fine can repeat labs in 6 months ----- Message ----- From: Ethelda Chick, RN Sent: 10/03/2022   5:23 PM EDT To: Wendall Stade, MD  The patient has been notified of the result and verbalized understanding.  All questions (if any) were answered. Cindi Carbon [place], RN 10/03/2022 5:22 PM   Will see if Dr. Eden Emms wants to still get lab work after Pharmacist note.

## 2022-10-14 ENCOUNTER — Other Ambulatory Visit: Payer: Self-pay | Admitting: Cardiovascular Disease

## 2022-10-23 IMAGING — CT CT CHEST W/O CM
2 of 5 series · 15 of 36 positions shown, 18 images · non-contrast
Comparison: None.

CLINICAL DATA: Follow-up lung nodules

EXAM:
CT CHEST WITHOUT CONTRAST
TECHNIQUE: Multidetector CT imaging of the chest was performed following the
standard protocol without IV contrast.

[Series 4: thins · axial · 0.79mm/px · z∈[-320,-37]mm · 12 of 468 slices shown, 15 images]
[im 32/468  mediastinal]
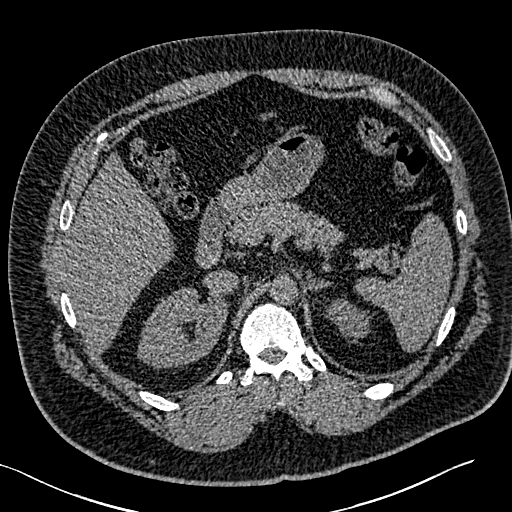
[im 32/468  lung]
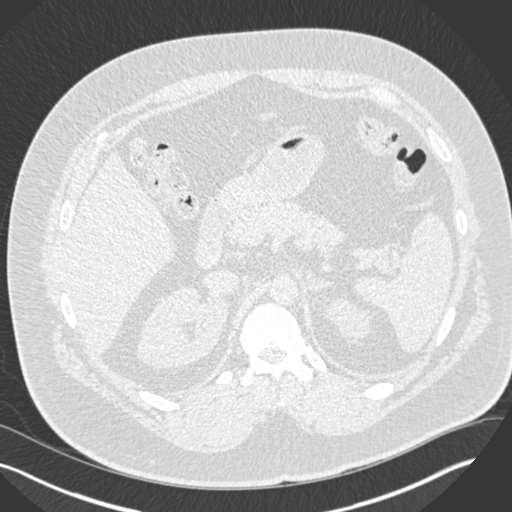
[im 63/468  lung]
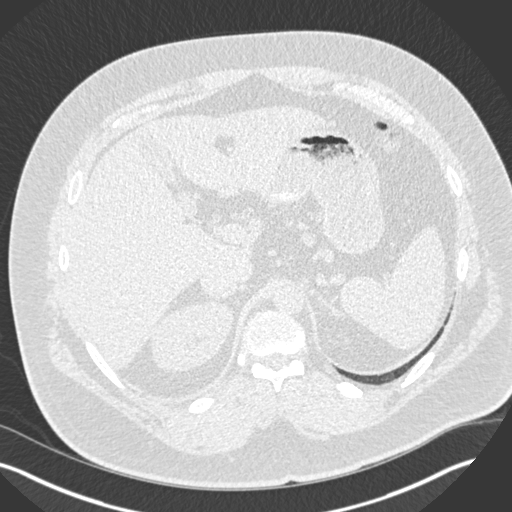
[im 94/468  lung]
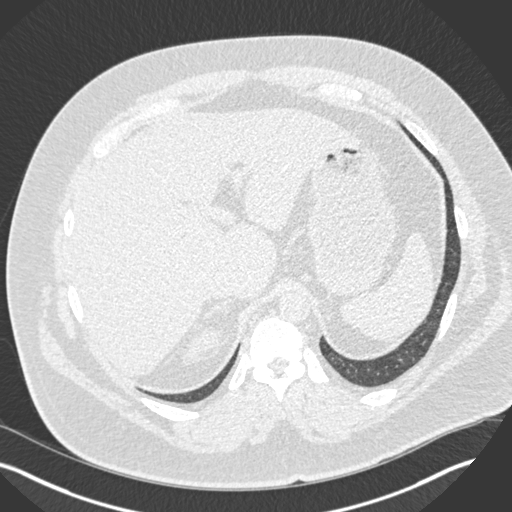
[im 156/468  lung]
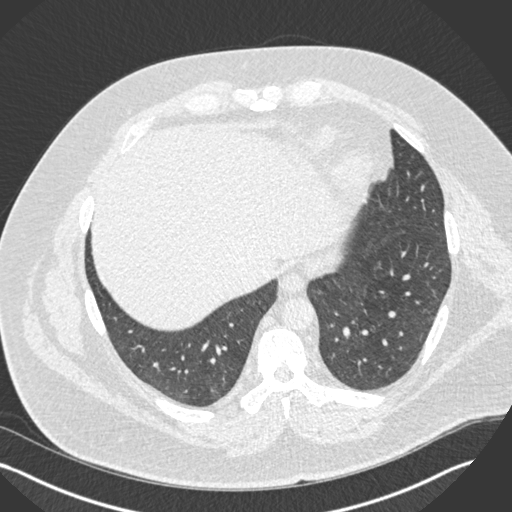
[im 187/468  mediastinal]
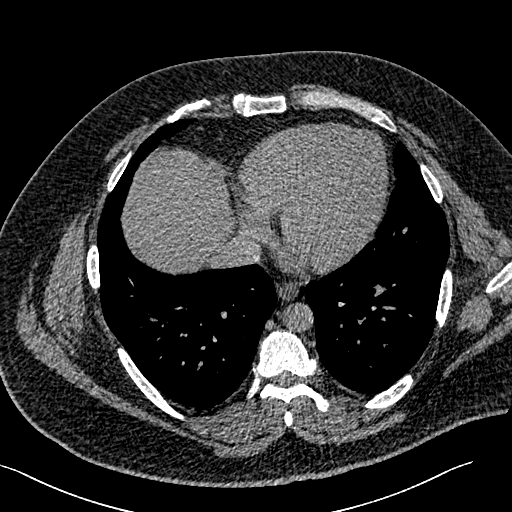
[im 187/468  lung]
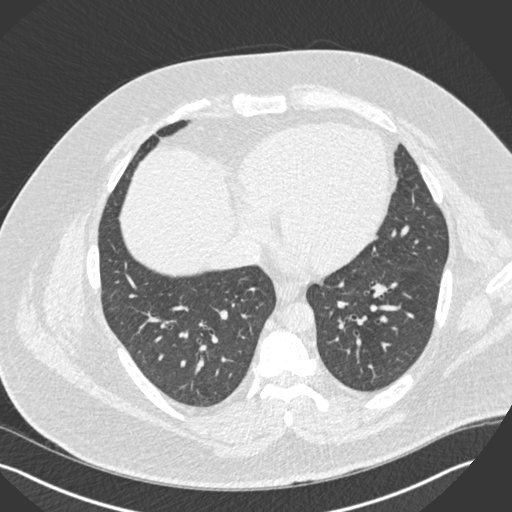
[im 218/468  lung]
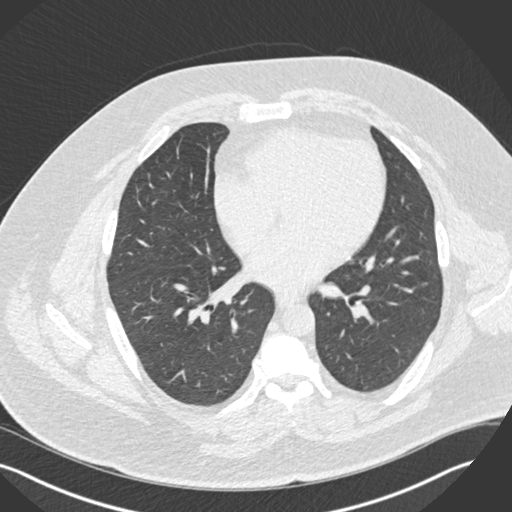
[im 250/468  lung]
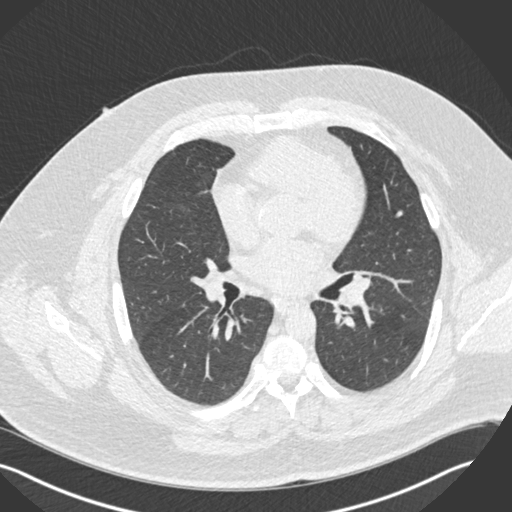
[im 281/468  lung]
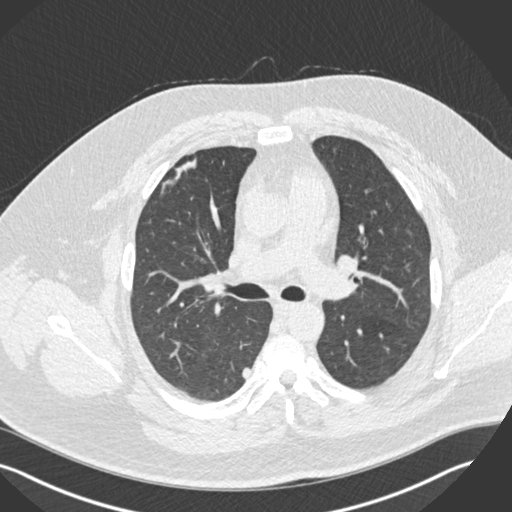
[im 312/468  mediastinal]
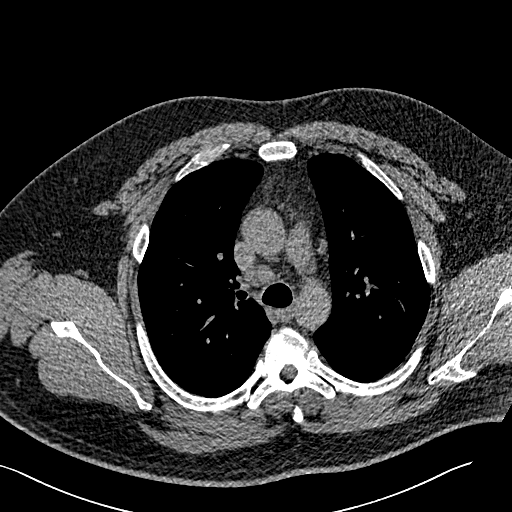
[im 312/468  lung]
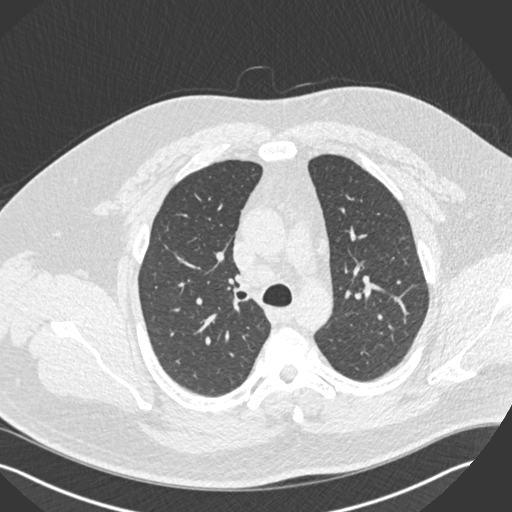
[im 374/468  lung]
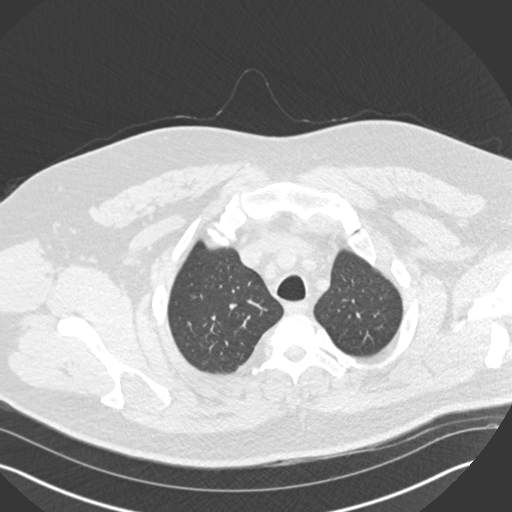
[im 405/468  lung]
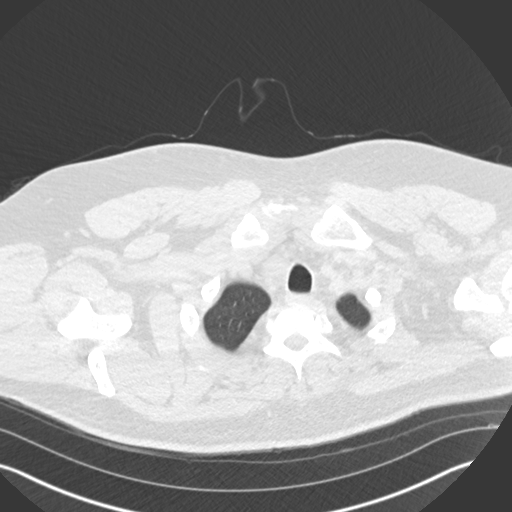
[im 436/468  lung]
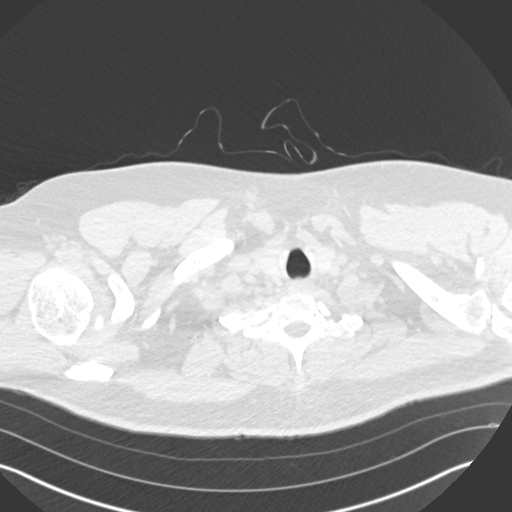

[Series 5: coronal · coronal · 0.64mm/px · 3 of 144 slices shown]
[im 29/144  lung]
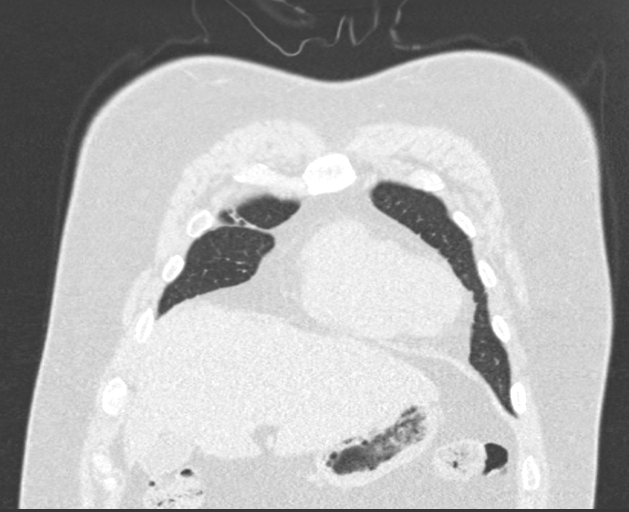
[im 58/144  lung]
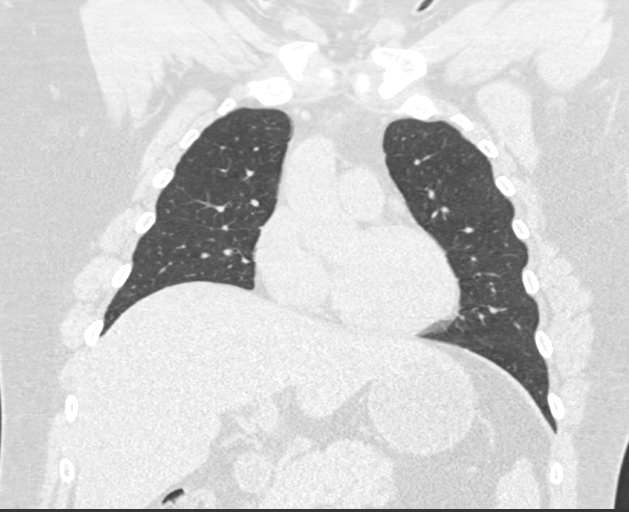
[im 86/144  lung]
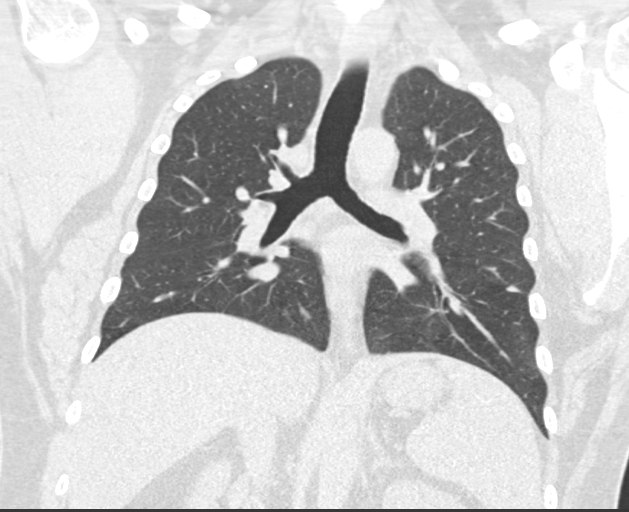

[15 of 36 positions shown; findings below may reference images not displayed]

FINDINGS: Cardiovascular: Heart size appears normal. Mild aortic
atherosclerosis. No pericardial effusion.

Mediastinum/Nodes: Normal appearance of the thyroid. The trachea
appears patent and is midline. Normal appearance of the esophagus.
Multiple prominent mediastinal lymph nodes are identified. The
largest is in the right paratracheal region measuring 1.7 cm.

Lungs/Pleura: Several bilateral pulmonary nodules are identified
including:

-within the periphery of the right upper lobe there is a nodule
measuring 1 cm, image 47/3.

-Nodule within the superior segment of right lower lobe measures 5
mm, image 65/3. Previously 4 mm.

-Subpleural nodule within the posteromedial aspect of the superior
segment of right lower lobe measures 6 mm, image 66/3. This measures
3 mm on the previous exam.

-Perifissural nodule along the oblique fissure of the left lung
measures 7 mm, image 95/3. Unchanged from the previous exam.

Several scattered calcified nodules identified compatible with prior
granulomatous disease.

Upper Abdomen: No acute abnormality.

Musculoskeletal: No chest wall mass or suspicious bone lesions
identified.
IMPRESSION: 1. There is a 1 cm nodule within the periphery of the right upper
lobe (this area was not imaged on the previous coronary calcium
score exam). 6 mm nodule within the superior segment of the right
lower lobe has increased from 3 mm previously. Additionally, there
is an enlarged lymph node within the right paratracheal region
measuring 1.7 cm. Recommend referral to thoracic surgery or
pulmonary medicine for further management.
2. Previously noted 5 mm nodule in the superior segment of right
lower lobe is stable from the previous exam and is presumably
benign.
3. Aortic atherosclerosis.

Aortic Atherosclerosis ([id number].E).

## 2022-10-24 ENCOUNTER — Ambulatory Visit: Payer: 59 | Admitting: Cardiovascular Disease

## 2022-10-30 ENCOUNTER — Other Ambulatory Visit: Payer: Self-pay | Admitting: Pulmonary Disease

## 2022-11-02 ENCOUNTER — Encounter: Payer: Self-pay | Admitting: Pulmonary Disease

## 2022-11-02 ENCOUNTER — Other Ambulatory Visit: Payer: Self-pay | Admitting: Pulmonary Disease

## 2022-11-02 MED ORDER — ZOLPIDEM TARTRATE ER 12.5 MG PO TBCR: 12.5000 mg | EXTENDED_RELEASE_TABLET | Freq: Every evening | ORAL | 2 refills | Status: DC | PRN

## 2022-11-02 NOTE — Telephone Encounter (Signed)
Already addressed

## 2022-11-02 NOTE — Progress Notes (Signed)
Ambien refilled

## 2022-11-07 ENCOUNTER — Ambulatory Visit: Payer: 59 | Admitting: Pulmonary Disease

## 2022-12-12 ENCOUNTER — Ambulatory Visit: Payer: 59 | Admitting: Pulmonary Disease

## 2022-12-17 ENCOUNTER — Ambulatory Visit (INDEPENDENT_AMBULATORY_CARE_PROVIDER_SITE_OTHER): Payer: 59 | Admitting: Pulmonary Disease

## 2022-12-17 ENCOUNTER — Encounter: Payer: Self-pay | Admitting: Pulmonary Disease

## 2022-12-17 VITALS — BP 122/82 | HR 66 | Ht 73.0 in | Wt 310.0 lb

## 2022-12-17 DIAGNOSIS — G4733 Obstructive sleep apnea (adult) (pediatric): Secondary | ICD-10-CM

## 2022-12-17 MED ORDER — ZOLPIDEM TARTRATE 10 MG PO TABS: 10.0000 mg | ORAL_TABLET | Freq: Every evening | ORAL | 5 refills | Status: DC | PRN

## 2022-12-17 NOTE — Patient Instructions (Signed)
I will see you in about a year  Continue using CPAP on a nightly basis  Prescription for Ambien 10 mg placed in place of 12.5 mg  Apologize for the delay in resolving obtaining a previous prescription

## 2022-12-17 NOTE — Progress Notes (Signed)
Cody Cruz    [id number]    Aug 15, 1975  Primary Care Physician:McGowen, Maryjean Morn, MD  Referring Physician: Jeoffrey Massed, MD [address] [place],  [place] [address]  Chief complaint:   History of obstructive sleep apnea Multiple awakenings at night  HPI:  Has had difficulty refilling 12.5 mg of Ambien which when he had it was working okay Sometimes feels it may be too strong  Lunesta was tried previously and was still waking up multiple times at night  He has a history of moderate obstructive sleep apnea for which he uses CPAP on a nightly basis Continues to benefit from CPAP, does not like using it but tolerates it  Good energy level during the day Wakes up feeling like his rested  Usually goes to bed between 10 and 1030 Wakes up about 6 AM Daytime symptoms are better  Uses Lunesta probably 3 or 4 days a week to help with sleep  No identifiable environmental factors contributing to awakenings Does not have significant musculoskeletal pain  Weight remains about the same  Pets: dogs Smoking history:non Smoker  Outpatient Encounter Medications as of 12/17/2022  Medication Sig   albuterol (VENTOLIN HFA) 108 (90 Base) MCG/ACT inhaler Inhale 2 puffs into the lungs every 6 (six) hours as needed for wheezing or shortness of breath.   aspirin EC 81 MG EC tablet Take 1 tablet (81 mg total) by mouth daily.   atorvastatin (LIPITOR) 80 MG tablet Take 1 tablet (80 mg total) by mouth daily.   cetirizine (ZYRTEC) 10 MG chewable tablet Chew 10 mg by mouth daily.   ezetimibe (ZETIA) 10 MG tablet Take 1 tablet (10 mg total) by mouth daily.   losartan (COZAAR) 50 MG tablet TAKE 1 TABLET (50 MG TOTAL) BY MOUTH DAILY. PLEASE KEEP UPCOMING APPT FOR FURTHER REFILLS.   zolpidem (AMBIEN CR) 12.5 MG CR tablet Take 1 tablet (12.5 mg total) by mouth at bedtime as needed for sleep.   No facility-administered encounter medications on file as of 12/17/2022.     Allergies as of 12/17/2022 - Review Complete 12/17/2022  Allergen Reaction Noted   Statins Other (See Comments) 11/11/2020    Past Medical History:  Diagnosis Date   Chest pain 07/2019   ACS ruled out. Stress test and echo normal.  BP was out of control at the time of his eval/hosp.   Esophageal abnormality 08/2019   mildly dilated distal esophagus + food debris in esoph noted on coronary CT calcium scoring imaging--refer to GI for eval for possible esoph dysmotility--pt declined this as of 08/2019.   Essential hypertension    Gout    toes   Hyperlipidemia    statins->gout?  ok on pravastatin 2023   Insomnia    lunesta helpful   Mild intermittent asthma    activity induced, typically.  Allergies trigger as a kid.   Obesity, Class II, BMI 35-39.9    OSA on CPAP    [facility]- uses cpap nightly   Plantar fasciitis 01/2019   steroid inj, Dr. Charlsie Merles   Pulmonary nodules 08/2019   incidentally noted on coronary calcium scoring imaging-> enlarging nodule on 1 yr f/u imaging->pulm eval->LN bx no malignant cells, bronch washings/lavage with some atypical cells favored to be reactive, no malignant cells.    Past Surgical History:  Procedure Laterality Date   APPENDECTOMY  1989   BRONCHIAL BRUSHINGS  09/27/2020  Procedure: BRONCHIAL BRUSHINGS;  Surgeon: Josephine Igo, DO;  Location: [facility];  Service: Pulmonary;;   BRONCHIAL NEEDLE ASPIRATION BIOPSY  09/27/2020   Procedure: BRONCHIAL NEEDLE ASPIRATION BIOPSIES;  Surgeon: Josephine Igo, DO;  Location: [facility];  Service: Pulmonary;;   BRONCHIAL WASHINGS  09/27/2020   Procedure: BRONCHIAL WASHINGS;  Surgeon: Josephine Igo, DO;  [facility];  Service: Pulmonary;;   CARDIOVASCULAR STRESS TEST  07/31/2019   NO ISCHEMIA   coronary ct  08/2019   calcium score ZERO.     TONSILLECTOMY AND ADENOIDECTOMY  1979   TRANSTHORACIC ECHOCARDIOGRAM  07/31/2019   normal   VIDEO BRONCHOSCOPY WITH ENDOBRONCHIAL NAVIGATION  Right 09/27/2020   Procedure: VIDEO BRONCHOSCOPY WITH ENDOBRONCHIAL NAVIGATION;  Surgeon: Josephine Igo, DO;  [facility];  Service: Pulmonary;  Laterality: Right;   VIDEO BRONCHOSCOPY WITH ENDOBRONCHIAL ULTRASOUND N/A 09/27/2020   Procedure: VIDEO BRONCHOSCOPY WITH ENDOBRONCHIAL ULTRASOUND;  Surgeon: Josephine Igo, DO;  [facility];  Service: Pulmonary;  Laterality: N/A;   WISDOM TOOTH EXTRACTION      Family History  Problem Relation Age of Onset   Colon polyps Mother    Arthritis Mother    Hyperlipidemia Mother    Hypertension Mother    Colon cancer Father 64   Hypertension Father    CAD Maternal Uncle 80 - 71   Colon cancer Paternal Uncle 66   CAD Maternal Grandfather 8 - 55   Esophageal cancer Neg Hx    Rectal cancer Neg Hx    Stomach cancer Neg Hx     Social History   Socioeconomic History   Marital status: Married    Spouse name: Not on file   Number of children: Not on file   Years of education: Not on file   Highest education level: Master's degree (e.g., MA, MS, MEng, MEd, MSW, MBA)  Occupational History   Not on file  Tobacco Use   Smoking status: Never   Smokeless tobacco: Never  Vaping Use   Vaping status: Never Used  Substance and Sexual Activity   Alcohol use: Yes    Comment: 10-14   Drug use: No   Sexual activity: Not on file  Other Topics Concern   Not on file  Social History Narrative   Married, two children (one son and one daughter).   Education: [employer] [place].   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   No tobacco.  Alc 3-4 beers per week avg.  No alc/drugs.   Exercise: CV and wt's at a GYM 3-5 days a week when feet are not hurting him.   Social Determinants of Health   Financial Resource Strain: Low Risk  (08/21/2021)   Overall Financial Resource Strain (CARDIA)    Difficulty of Paying Living Expenses: Not hard at all  Food Insecurity: No Food Insecurity (08/21/2021)   Hunger Vital Sign     Worried About Running Out of Food in the Last Year: Never true    Ran Out of Food in the Last Year: Never true  Transportation Needs: No Transportation Needs (08/21/2021)   PRAPARE - Administrator, Civil Service (Medical): No    Lack of Transportation (Non-Medical): No  Physical Activity: Sufficiently Active (08/21/2021)   Exercise Vital Sign    Days of Exercise per Week: 3 days    Minutes of Exercise per Session: 60 min  Stress: No Stress Concern Present (08/21/2021)   [employer] of Occupational Health - Occupational Stress Questionnaire  Feeling of Stress : Only a little  Social Connections: Moderately Isolated (08/21/2021)   Social Connection and Isolation Panel [NHANES]    Frequency of Communication with Friends and Family: Twice a week    Frequency of Social Gatherings with Friends and Family: Once a week    Attends Religious Services: Never    Database administrator or Organizations: No    Attends Engineer, structural: Not on file    Marital Status: Married  Catering manager Violence: Not on file    Review of Systems  Constitutional:  Negative for fatigue.  Eyes: Negative.   Respiratory:  Positive for apnea. Negative for shortness of breath.   Endocrine: Negative.   Skin: Negative.   Psychiatric/Behavioral:  Positive for sleep disturbance.   All other systems reviewed and are negative.   Vitals:   12/17/22 0905  BP: 122/82  Pulse: 66  SpO2: 97%     Physical Exam Constitutional:      Appearance: He is well-developed. He is obese.     Comments: Obese  HENT:     Head: Normocephalic and atraumatic.  Eyes:     General:        Right eye: No discharge.        Left eye: No discharge.     Conjunctiva/sclera: Conjunctivae normal.     Pupils: Pupils are equal, round, and reactive to light.  Neck:     Thyroid: No thyromegaly.     Trachea: No tracheal deviation.  Cardiovascular:     Rate and Rhythm: Normal rate and regular rhythm.  Pulmonary:      Effort: No respiratory distress.     Breath sounds: Normal breath sounds. No stridor. No wheezing or rhonchi.  Abdominal:     Palpations: Abdomen is soft.  Musculoskeletal:     Cervical back: Normal range of motion and neck supple.  Neurological:     Mental Status: He is alert.  Psychiatric:        Mood and Affect: Mood normal.    Data Reviewed: Sleep study results reviewed  Excellent compliance with residual AHI of 0.5  Assessment:  Obstructive sleep apnea -Compliance continues to be very good -Obstructive sleep apnea appears to be well treated at present  Multiple awakenings -Was on Ambien 12.5 -Was unable to get refills on this -We agreed on trying Ambien 10 mg as an alternative  He continues to function well with his CPAP   Plan/Recommendations:  Continue auto CPAP 7-12  Prescription for Ambien 10 mg sent to pharmacy in place of CR 12.5  I did describe the process of an inspire device as an option for intolerance to CPAP essentially tolerating CPAP well at present  Follow-up in a year  Encouraged to give [place] a call if you are still not able to get Ambien 10 mg filled  Virl Diamond MD [facility] 12/17/2022, 9:09 AM  CC: McGowen, Maryjean Morn, MD

## 2023-01-22 ENCOUNTER — Other Ambulatory Visit: Payer: Self-pay | Admitting: Cardiovascular Disease

## 2023-02-28 NOTE — Progress Notes (Signed)
CARDIOLOGY CONSULT NOTE       Patient ID: Cody Cruz MRN: [id number] DOB/AGE: 11/19/1975 47 y.o.  Primary Physician: Jeoffrey Massed, MD Primary Cardiologist: Eden Emms   HPI:  47 y.o. seen in hospital 07/31/19 for somewhat atypical chest pain.  R/O no acute ECG changes Lexiscan myovue with no ischemia EF 53% TTE with normal EF no RWMAls and no valve disease CXR NAD CRFls include HTN and HLD D/c on statin and ARB Prior to admission had not been compliant with his HTN meds   Been fine since d/c no chest pain. BP still up Has not seen primary yet. 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 Daughter at [employer] for college   Now on high dose lipitor and zetia for his HLD LDL improved 86 Acceptble with calcium score 07/15/22 of only 1.23 isolated to LM  Discussed GLP-1 meds and weight loss before any knee surgery   ROS All other systems reviewed and negative except as noted above  Past Medical History:  Diagnosis Date   Chest pain 07/2019   ACS ruled out. Stress test and echo normal.  BP was out of control at the time of his eval/hosp.   Esophageal abnormality 08/2019   mildly dilated distal esophagus + food debris in esoph noted on coronary CT calcium scoring imaging--refer to GI for eval for possible esoph dysmotility--pt declined this as of 08/2019.   Essential hypertension    Gout    toes   Hyperlipidemia    statins->gout?  ok on pravastatin 2023   Insomnia    lunesta helpful   Mild intermittent asthma    activity induced, typically.  Allergies trigger as a kid.    Obesity, Class II, BMI 35-39.9    OSA on CPAP    [facility]- uses cpap nightly   Plantar fasciitis 01/2019   steroid inj, Dr. Charlsie Merles   Pulmonary nodules 08/2019   incidentally noted on coronary calcium scoring imaging-> enlarging nodule on 1 yr f/u imaging->pulm eval->LN bx no malignant cells, bronch washings/lavage with some atypical cells favored to be reactive, no malignant cells.    Family History  Problem Relation Age of Onset   Colon polyps Mother    Arthritis Mother    Hyperlipidemia Mother    Hypertension Mother    Colon cancer Father 77   Hypertension Father    CAD Maternal Uncle 17 - 35   Colon cancer Paternal Uncle 96   CAD Maternal Grandfather 2 - 55   Esophageal cancer Neg Hx    Rectal cancer Neg Hx    Stomach cancer Neg Hx     Social History   Socioeconomic History   Marital status: Married    Spouse name: Not on file   Number of children: Not on file   Years of education: Not on file   Highest education level: Master's degree (e.g., MA, MS, MEng, MEd, MSW, MBA)  Occupational History   Not on file  Tobacco Use   Smoking status: Never  Smokeless tobacco: Never  Vaping Use   Vaping status: Never Used  Substance and Sexual Activity   Alcohol use: Yes    Comment: 10-14   Drug use: No   Sexual activity: Not on file  Other Topics Concern   Not on file  Social History Narrative   Married, two children (one son and one daughter).   Education: [employer] [place].   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   No tobacco.  Alc 3-4 beers per week avg.  No alc/drugs.   Exercise: CV and wt's at a GYM 3-5 days a week when feet are not hurting him.   Social Drivers of Corporate investment banker Strain: Low Risk  (08/21/2021)   Overall Financial Resource Strain (CARDIA)    Difficulty of Paying Living Expenses: Not hard at all  Food Insecurity: No Food Insecurity (08/21/2021)   Hunger Vital Sign    Worried About Running Out of Food in the Last Year:  Never true    Ran Out of Food in the Last Year: Never true  Transportation Needs: No Transportation Needs (08/21/2021)   PRAPARE - Administrator, Civil Service (Medical): No    Lack of Transportation (Non-Medical): No  Physical Activity: Sufficiently Active (08/21/2021)   Exercise Vital Sign    Days of Exercise per Week: 3 days    Minutes of Exercise per Session: 60 min  Stress: No Stress Concern Present (08/21/2021)   [employer] of Occupational Health - Occupational Stress Questionnaire    Feeling of Stress : Only a little  Social Connections: Moderately Isolated (08/21/2021)   Social Connection and Isolation Panel [NHANES]    Frequency of Communication with Friends and Family: Twice a week    Frequency of Social Gatherings with Friends and Family: Once a week    Attends Religious Services: Never    Database administrator or Organizations: No    Attends Engineer, structural: Not on file    Marital Status: Married  Catering manager Violence: Not on file    Past Surgical History:  Procedure Laterality Date   APPENDECTOMY  1989   BRONCHIAL BRUSHINGS  09/27/2020   Procedure: BRONCHIAL BRUSHINGS;  Surgeon: Josephine Igo, DO;  [facility];  Service: Pulmonary;;   BRONCHIAL NEEDLE ASPIRATION BIOPSY  09/27/2020   Procedure: BRONCHIAL NEEDLE ASPIRATION BIOPSIES;  Surgeon: Josephine Igo, DO;  [facility];  Service: Pulmonary;;   BRONCHIAL WASHINGS  09/27/2020   Procedure: BRONCHIAL WASHINGS;  Surgeon: Josephine Igo, DO;  [facility];  Service: Pulmonary;;   CARDIOVASCULAR STRESS TEST  07/31/2019   NO ISCHEMIA   coronary ct  08/2019   calcium score ZERO.     TONSILLECTOMY AND ADENOIDECTOMY  1979   TRANSTHORACIC ECHOCARDIOGRAM  07/31/2019   normal   VIDEO BRONCHOSCOPY WITH ENDOBRONCHIAL NAVIGATION Right 09/27/2020   Procedure: VIDEO BRONCHOSCOPY WITH ENDOBRONCHIAL NAVIGATION;  Surgeon: Josephine Igo, DO;  [facility];  Service: Pulmonary;  Laterality: Right;   VIDEO BRONCHOSCOPY WITH ENDOBRONCHIAL ULTRASOUND N/A 09/27/2020   Procedure: VIDEO BRONCHOSCOPY WITH ENDOBRONCHIAL ULTRASOUND;  Surgeon: Josephine Igo, DO;  [facility];  Service: Pulmonary;  Laterality: N/A;   WISDOM TOOTH EXTRACTION        Current Outpatient Medications:    albuterol (VENTOLIN HFA) 108 (90 Base) MCG/ACT inhaler, Inhale 2 puffs into the lungs every 6 (six) hours as needed for wheezing or shortness of breath., Disp: 8 g, Rfl: 0  aspirin EC 81 MG EC tablet, Take 1 tablet (81 mg total) by mouth daily., Disp: 150 tablet, Rfl: 3   atorvastatin (LIPITOR) 80 MG tablet, Take 1 tablet (80 mg total) by mouth daily., Disp: 90 tablet, Rfl: 3   cetirizine (ZYRTEC) 10 MG chewable tablet, Chew 10 mg by mouth daily., Disp: , Rfl:    ezetimibe (ZETIA) 10 MG tablet, Take 1 tablet (10 mg total) by mouth daily., Disp: 90 tablet, Rfl: 3   losartan (COZAAR) 50 MG tablet, TAKE 1 TABLET (50 MG TOTAL) BY MOUTH DAILY. PLEASE KEEP UPCOMING APPT FOR FURTHER REFILLS., Disp: 30 tablet, Rfl: 0   zolpidem (AMBIEN) 10 MG tablet, Take 1 tablet (10 mg total) by mouth at bedtime as needed for sleep., Disp: 30 tablet, Rfl: 5    Physical Exam: Blood pressure 120/76, pulse (!) 56, resp. rate 16, height 6\' 1"  (1.854 m), weight (!) 321 lb 3.2 oz (145.7 kg), SpO2 98%.   Affect appropriate Healthy:  appears stated age HEENT: normal Neck supple with no adenopathy JVP normal no bruits no thyromegaly Lungs clear with no wheezing and good diaphragmatic motion Heart:  S1/S2 no murmur, no rub, gallop or click PMI normal Abdomen: benighn, BS positve, no tenderness, no AAA no bruit.  No HSM or HJR Distal pulses intact with no bruits No edema Neuro non-focal Skin warm and dry No muscular weakness   Labs:   Lab Results  Component Value Date   WBC 6.3 07/31/2019   HGB 15.6 09/27/2020   HCT 46.0 09/27/2020   MCV 88.1 07/31/2019   PLT 224  07/31/2019    No results for input(s): "NA", "K", "CL", "CO2", "BUN", "CREATININE", "CALCIUM", "PROT", "BILITOT", "ALKPHOS", "ALT", "AST", "GLUCOSE" in the last 168 hours.  Invalid input(s): "LABALBU" No results found for: "CKTOTAL", "CKMB", "CKMBINDEX", "TROPONINI"  Lab Results  Component Value Date   CHOL 155 10/01/2022   CHOL 198 05/30/2022   CHOL 187 02/12/2022   Lab Results  Component Value Date   HDL 37 (L) 10/01/2022   HDL 39 (L) 05/30/2022   HDL 43 02/12/2022   Lab Results  Component Value Date   LDLCALC 86 10/01/2022   LDLCALC 126 (H) 05/30/2022   LDLCALC 118 (H) 02/12/2022   Lab Results  Component Value Date   TRIG 190 (H) 10/01/2022   TRIG 185 (H) 05/30/2022   TRIG 145 02/12/2022   Lab Results  Component Value Date   CHOLHDL 4.2 10/01/2022   CHOLHDL 5.1 (H) 05/30/2022   CHOLHDL 4.3 02/12/2022   No results found for: "LDLDIRECT"    Radiology: No results found.  EKG: NSR normal eCG 07/31/19   ASSESSMENT AND PLAN:   1. Chest pain atypical r/o normal ECG, Echo and myovue 07/31/19.  Also reassuring that calcium score was 0 on 08/25/19 and only 1.26 07/13/22  2. HLD :  improved on high dose lipitor and zetia  3. HTN: discussed diet, weight loss and exercise continue ARB increased dose to 50 mg daily on 01/13/21   4. Gout:  Avoid dehydration discuss steroids, colchicine and xray with primary Doubt this is from statin   5. OSA/Lung Nodule:  post biopsy/PET negative biopsy f/u CT scan 06/05/22 stable OSA using CPAP   6. Ortho:  right knee degenerated after arthroscopic surgery F/U primary start GLP-1 try to get under 300 lbs before any surgery Gave him Thayer Ohm Blackmon's name as good surgeion    F/U with me in a year   Time spent reviewing chart,  echo myovue direct patient interview and composing note 25 minutes   Signed: Charlton Haws 03/11/2023, 8:09 AM

## 2023-03-11 ENCOUNTER — Ambulatory Visit: Payer: 59

## 2023-03-11 ENCOUNTER — Encounter: Payer: Self-pay | Admitting: Cardiovascular Disease

## 2023-03-11 ENCOUNTER — Ambulatory Visit: Payer: 59 | Attending: Cardiovascular Disease | Admitting: Cardiovascular Disease

## 2023-03-11 VITALS — BP 120/76 | HR 56 | Resp 16 | Ht 73.0 in | Wt 321.2 lb

## 2023-03-11 DIAGNOSIS — E782 Mixed hyperlipidemia: Secondary | ICD-10-CM

## 2023-03-11 DIAGNOSIS — I10 Essential (primary) hypertension: Secondary | ICD-10-CM | POA: Diagnosis not present

## 2023-03-11 DIAGNOSIS — Z79899 Other long term (current) drug therapy: Secondary | ICD-10-CM

## 2023-03-11 LAB — LIPID PANEL
Chol/HDL Ratio: 4 {ratio} (ref 0.0–5.0)
Cholesterol, Total: 164 mg/dL (ref 100–199)
HDL: 41 mg/dL (ref >39)
LDL Chol Calc (NIH): 91 mg/dL (ref 0–99)
Triglycerides: 189 mg/dL — ABNORMAL HIGH (ref 0–149)
VLDL Cholesterol Cal: 32 mg/dL (ref 5–40)

## 2023-03-11 LAB — HEPATIC FUNCTION PANEL
ALT: 75 [IU]/L — ABNORMAL HIGH (ref 0–44)
AST: 52 [IU]/L — ABNORMAL HIGH (ref 0–40)
Albumin: 4.6 g/dL (ref 4.1–5.1)
Alkaline Phosphatase: 101 [IU]/L (ref 44–121)
Bilirubin Total: 0.7 mg/dL (ref 0.0–1.2)
Bilirubin, Direct: 0.22 mg/dL (ref 0.00–0.40)
Total Protein: 6.9 g/dL (ref 6.0–8.5)

## 2023-03-11 NOTE — Patient Instructions (Signed)

## 2023-03-12 ENCOUNTER — Telehealth: Payer: Self-pay

## 2023-03-12 DIAGNOSIS — E7800 Pure hypercholesterolemia, unspecified: Secondary | ICD-10-CM

## 2023-03-12 DIAGNOSIS — E782 Mixed hyperlipidemia: Secondary | ICD-10-CM

## 2023-03-12 DIAGNOSIS — Z79899 Other long term (current) drug therapy: Secondary | ICD-10-CM

## 2023-03-12 DIAGNOSIS — I10 Essential (primary) hypertension: Secondary | ICD-10-CM

## 2023-03-12 NOTE — Telephone Encounter (Signed)
-----   Message from Charlton Haws sent at 03/12/2023  9:02 AM EST ----- LDL < 100 on max dose ok  LFTls elevated needs to lose weight limit ETOH and recheck LFTls in 3 months

## 2023-03-12 NOTE — Telephone Encounter (Signed)
Sent this message to patient through his mychart. Per Dr. Eden Emms your LDL is below 100 on max dose of cholesterol medication, which is okay. Your liver panel was elevated, you need to lose wight and limit alcohol to help with this. We will recheck liver panel in 3 months. Please go to any [facility] to get lab work done in March.

## 2023-05-01 ENCOUNTER — Other Ambulatory Visit: Payer: Self-pay | Admitting: Cardiovascular Disease

## 2023-05-06 ENCOUNTER — Encounter: Payer: Self-pay | Admitting: Cardiovascular Disease

## 2023-06-05 ENCOUNTER — Ambulatory Visit: Payer: 59 | Admitting: Orthopaedic Surgery

## 2023-06-17 ENCOUNTER — Other Ambulatory Visit (INDEPENDENT_AMBULATORY_CARE_PROVIDER_SITE_OTHER)

## 2023-06-17 ENCOUNTER — Encounter: Payer: Self-pay | Admitting: Orthopaedic Surgery

## 2023-06-17 ENCOUNTER — Ambulatory Visit (INDEPENDENT_AMBULATORY_CARE_PROVIDER_SITE_OTHER): Admitting: Orthopaedic Surgery

## 2023-06-17 VITALS — Ht 73.0 in | Wt 321.0 lb

## 2023-06-17 DIAGNOSIS — M25562 Pain in left knee: Secondary | ICD-10-CM

## 2023-06-17 DIAGNOSIS — G8929 Other chronic pain: Secondary | ICD-10-CM

## 2023-06-17 DIAGNOSIS — S76312S Strain of muscle, fascia and tendon of the posterior muscle group at thigh level, left thigh, sequela: Secondary | ICD-10-CM

## 2023-06-17 DIAGNOSIS — M25561 Pain in right knee: Secondary | ICD-10-CM | POA: Diagnosis not present

## 2023-06-17 MED ORDER — METHYLPREDNISOLONE ACETATE 40 MG/ML IJ SUSP
40.0000 mg | INTRAMUSCULAR | Status: AC | PRN
  Administered 2023-06-17: 40 mg via INTRA_ARTICULAR

## 2023-06-17 MED ORDER — LIDOCAINE HCL 1 % IJ SOLN
3.0000 mL | INTRAMUSCULAR | Status: AC | PRN
  Administered 2023-06-17: 3 mL

## 2023-06-17 NOTE — Progress Notes (Signed)
 The patient is a very pleasant and active 48 year old gentleman who comes in for evaluation treatment of bilateral knee pain this been going on for many years now.  He does have a remote history of a right knee arthroscopy with meniscal surgery on the medial meniscus.  This was several years ago in 2017.  In 2018 he was sliding into third base playing softball and he sustained a significant hamstring tear of the left hamstring.  That has been a significant chronic problem for him.  He has been to physical therapy off and on for several years now.  He is a Advertising copywriter as well.  He says his biggest issue right now is the hamstring on the left side.  He says his knees do hurt and the left knee has been hurting on the medial aspect of the knee with pivoting activities.  There is no locking and catching.  Occasionally he still gets some right knee pain.  Examination of both knees today shows no effusion of either knee.  Both knees have strong quads with normal extension and flexion and the alignment appears neutral.  When I do examine his hamstrings though he does have a significant deficit of his hamstring on the left side and even just the muscle itself seems lower from a chronic rupture.  An AP and lateral both knees today show no effusion.  The alignment is neutral.  The joint space medial and lateral as well as patellofemoral overall well-maintained.  I actually talked him about trying a steroid injection in his left knee today to see how this helps with the symptoms from the medial joint line tenderness and pain that he is having.  I would also like to send him to my partner Dr. Steward Drone who is a fellowship trained sports medicine orthopedic surgeon who can critically assess the chronic hamstring tendon tear and let the patient know what the potential options are.  We will obtain a MRI of his left thigh from the ischium of the pelvis down to the lower femur in order to assess the hamstring muscle and tendon  at its insertion.  I did place a steroid injection in his left knee without difficulty.  I will also have him follow-up with my partner for his knees as well given that the patient is still an active athlete and has a high level of function.  The patient agrees with this treatment plan.    Procedure Note  Patient: Cody Cruz             Date of Birth: 03/19/1976           MRN: [id number]             Visit Date: 06/17/2023  Procedures: Visit Diagnoses:  1. Chronic pain of left knee   2. Chronic pain of right knee   3. Tear of left hamstring, sequela     Large Joint Inj: L knee on 06/17/2023 12:23 PM Indications: diagnostic evaluation and pain Details: 22 G 1.5 in needle, superolateral approach  Arthrogram: No  Medications: 3 mL lidocaine 1 %; 40 mg methylPREDNISolone acetate 40 MG/ML Outcome: tolerated well, no immediate complications Procedure, treatment alternatives, risks and benefits explained, specific risks discussed. Consent was given by the patient. Immediately prior to procedure a time out was called to verify the correct patient, procedure, equipment, support staff and site/side marked as required. Patient was prepped and draped in the usual sterile fashion.

## 2023-06-18 ENCOUNTER — Other Ambulatory Visit: Payer: Self-pay

## 2023-06-18 DIAGNOSIS — S76312S Strain of muscle, fascia and tendon of the posterior muscle group at thigh level, left thigh, sequela: Secondary | ICD-10-CM

## 2023-06-28 ENCOUNTER — Ambulatory Visit
Admission: RE | Admit: 2023-06-28 | Discharge: 2023-06-28 | Disposition: A | Discharge status: Home / Self Care | Source: Ambulatory Visit | Attending: Orthopaedic Surgery | Admitting: Orthopaedic Surgery

## 2023-06-28 DIAGNOSIS — S76312S Strain of muscle, fascia and tendon of the posterior muscle group at thigh level, left thigh, sequela: Secondary | ICD-10-CM

## 2023-07-12 ENCOUNTER — Ambulatory Visit (HOSPITAL_BASED_OUTPATIENT_CLINIC_OR_DEPARTMENT_OTHER): Admitting: Orthopaedic Surgery

## 2023-07-12 ENCOUNTER — Ambulatory Visit (HOSPITAL_BASED_OUTPATIENT_CLINIC_OR_DEPARTMENT_OTHER): Payer: Self-pay | Admitting: Orthopaedic Surgery

## 2023-07-12 ENCOUNTER — Other Ambulatory Visit (HOSPITAL_BASED_OUTPATIENT_CLINIC_OR_DEPARTMENT_OTHER): Payer: Self-pay

## 2023-07-12 DIAGNOSIS — S76312A Strain of muscle, fascia and tendon of the posterior muscle group at thigh level, left thigh, initial encounter: Secondary | ICD-10-CM | POA: Diagnosis not present

## 2023-07-12 DIAGNOSIS — S76312S Strain of muscle, fascia and tendon of the posterior muscle group at thigh level, left thigh, sequela: Secondary | ICD-10-CM | POA: Diagnosis not present

## 2023-07-12 MED ORDER — OXYCODONE HCL 5 MG PO TABS
5.0000 mg | ORAL_TABLET | ORAL | 0 refills | Status: DC | PRN
  Filled 2023-07-12: qty 10, 2d supply, fill #0

## 2023-07-12 MED ORDER — IBUPROFEN 800 MG PO TABS
800.0000 mg | ORAL_TABLET | Freq: Three times a day (TID) | ORAL | 0 refills | Status: AC
  Filled 2023-07-12: qty 30, 10d supply, fill #0

## 2023-07-12 MED ORDER — ACETAMINOPHEN 500 MG PO TABS
500.0000 mg | ORAL_TABLET | Freq: Three times a day (TID) | ORAL | 0 refills | Status: AC
  Filled 2023-07-12: qty 30, 10d supply, fill #0

## 2023-07-12 MED ORDER — ASPIRIN 325 MG PO TBEC
325.0000 mg | DELAYED_RELEASE_TABLET | Freq: Every day | ORAL | 0 refills | Status: AC
Start: 2023-07-12 — End: ?
  Filled 2023-07-12: qty 14, 14d supply, fill #0

## 2023-07-12 NOTE — Progress Notes (Signed)
 Chief Complaint: Buttocks pain     History of Present Illness:    Cody Cruz is a 48 y.o. male presents with left posterior hip pain after an injury in 2019 where he was sliding into third base and hyper flexed at the left hip with the knee in full extension.  Since this time he was found to have a hamstring rupture on the left side with bruising and pain.  He is experiencing pain about the ischial tuberosity.  He has had 3 bouts of physical therapy without any significant improvement.  He has not had any injections in this area.  Is very active enjoys golfing.  He is referred to me today from my partner Dr. Lucienne Ryder for further discussion of the proximal hamstring    PMH/PSH/Family History/Social History/Meds/Allergies:    Past Medical History:  Diagnosis Date   Chest pain 07/2019   ACS ruled out. Stress test and echo normal.  BP was out of control at the time of his eval/hosp.   Esophageal abnormality 08/2019   mildly dilated distal esophagus + food debris in esoph noted on coronary CT calcium  scoring imaging--refer to GI for eval for possible esoph dysmotility--pt declined this as of 08/2019.   Essential hypertension    Gout    toes   Hyperlipidemia    statins->gout?  ok on pravastatin  2023   Insomnia    lunesta  helpful   Mild intermittent asthma    activity induced, typically.  Allergies trigger as a kid.   Obesity, Class II, BMI 35-39.9    OSA on CPAP    [facility]- uses cpap nightly   Plantar fasciitis 01/2019   steroid inj, Dr. Celia Coles   Pulmonary nodules 08/2019   incidentally noted on coronary calcium  scoring imaging-> enlarging nodule on 1 yr f/u imaging->pulm eval->LN bx no malignant cells, bronch washings/lavage with some atypical cells favored to be reactive, no malignant cells.   Past Surgical History:  Procedure Laterality Date   APPENDECTOMY  1989   BRONCHIAL BRUSHINGS  09/27/2020   Procedure: BRONCHIAL BRUSHINGS;  Surgeon: Prudy Brownie, DO;   Location: [facility];  Service: Pulmonary;;   BRONCHIAL NEEDLE ASPIRATION BIOPSY  09/27/2020   Procedure: BRONCHIAL NEEDLE ASPIRATION BIOPSIES;  Surgeon: Prudy Brownie, DO;  Location: [facility];  Service: Pulmonary;;   BRONCHIAL WASHINGS  09/27/2020   Procedure: BRONCHIAL WASHINGS;  Surgeon: Prudy Brownie, DO;  Location: [facility];  Service: Pulmonary;;   CARDIOVASCULAR STRESS TEST  07/31/2019   NO ISCHEMIA   coronary ct  08/2019   calcium  score ZERO.     TONSILLECTOMY AND ADENOIDECTOMY  1979   TRANSTHORACIC ECHOCARDIOGRAM  07/31/2019   normal   VIDEO BRONCHOSCOPY WITH ENDOBRONCHIAL NAVIGATION Right 09/27/2020   Procedure: VIDEO BRONCHOSCOPY WITH ENDOBRONCHIAL NAVIGATION;  Surgeon: Prudy Brownie, DO;  Location: [facility];  Service: Pulmonary;  Laterality: Right;   VIDEO BRONCHOSCOPY WITH ENDOBRONCHIAL ULTRASOUND N/A 09/27/2020   Procedure: VIDEO BRONCHOSCOPY WITH ENDOBRONCHIAL ULTRASOUND;  Surgeon: Prudy Brownie, DO;  Location: [facility];  Service: Pulmonary;  Laterality: N/A;   WISDOM TOOTH EXTRACTION     Social History   Socioeconomic History   Marital status: Married    Spouse name: Not on file   Number of children: Not on file   Years of education: Not on file   Highest education level: Master's degree (e.g., MA, MS, MEng, MEd, MSW, MBA)  Occupational History   Not on file  Tobacco Use   Smoking  status: Never   Smokeless tobacco: Never  Vaping Use   Vaping status: Never Used  Substance and Sexual Activity   Alcohol use: Yes    Comment: 10-14   Drug use: No   Sexual activity: Not on file  Other Topics Concern   Not on file  Social History Narrative   Married, two children (one son and one daughter).   Education: [name] [place] [employer].   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   No tobacco.  Alc 3-4 beers per week avg.  No alc/drugs.   Exercise: CV and wt's at a GYM 3-5 days a week when feet are not hurting him.   Social Drivers of  Corporate investment banker Strain: Low Risk  (08/21/2021)   Overall Financial Resource Strain (CARDIA)    Difficulty of Paying Living Expenses: Not hard at all  Food Insecurity: No Food Insecurity (08/21/2021)   Hunger Vital Sign    Worried About Running Out of Food in the Last Year: Never true    Ran Out of Food in the Last Year: Never true  Transportation Needs: No Transportation Needs (08/21/2021)   PRAPARE - Administrator, Civil Service (Medical): No    Lack of Transportation (Non-Medical): No  Physical Activity: Sufficiently Active (08/21/2021)   Exercise Vital Sign    Days of Exercise per Week: 3 days    Minutes of Exercise per Session: 60 min  Stress: No Stress Concern Present (08/21/2021)   [employer] of Occupational Health - Occupational Stress Questionnaire    Feeling of Stress : Only a little  Social Connections: Moderately Isolated (08/21/2021)   Social Connection and Isolation Panel [NHANES]    Frequency of Communication with Friends and Family: Twice a week    Frequency of Social Gatherings with Friends and Family: Once a week    Attends Religious Services: Never    Database administrator or Organizations: No    Attends Engineer, structural: Not on file    Marital Status: Married   Family History  Problem Relation Age of Onset   Colon polyps Mother    Arthritis Mother    Hyperlipidemia Mother    Hypertension Mother    Colon cancer Father 38   Hypertension Father    CAD Maternal Uncle 81 - 60   Colon cancer Paternal Uncle 60   CAD Maternal Grandfather 9 - 55   Esophageal cancer Neg Hx    Rectal cancer Neg Hx    Stomach cancer Neg Hx    Allergies  Allergen Reactions   Statins Other (See Comments)    ?gout   Current Outpatient Medications  Medication Sig Dispense Refill   acetaminophen  (TYLENOL ) 500 MG tablet Take 1 tablet (500 mg total) by mouth every 8 (eight) hours for 10 days. 30 tablet 0   aspirin  EC 325 MG tablet Take 1 tablet  (325 mg total) by mouth daily. 14 tablet 0   ibuprofen  (ADVIL ) 800 MG tablet Take 1 tablet (800 mg total) by mouth every 8 (eight) hours for 10 days. Please take with food, please alternate with acetaminophen  30 tablet 0   oxyCODONE  (ROXICODONE ) 5 MG immediate release tablet Take 1 tablet (5 mg total) by mouth every 4 (four) hours as needed for severe pain (pain score 7-10) or breakthrough pain. 10 tablet 0   albuterol  (VENTOLIN  HFA) 108 (90 Base) MCG/ACT inhaler Inhale 2 puffs into the lungs every 6 (six) hours as needed for wheezing or shortness  of breath. 8 g 0   aspirin  EC 81 MG EC tablet Take 1 tablet (81 mg total) by mouth daily. 150 tablet 3   atorvastatin  (LIPITOR) 80 MG tablet Take 1 tablet (80 mg total) by mouth daily. 90 tablet 3   cetirizine (ZYRTEC) 10 MG chewable tablet Chew 10 mg by mouth daily.     ezetimibe  (ZETIA ) 10 MG tablet Take 1 tablet (10 mg total) by mouth daily. 90 tablet 3   losartan  (COZAAR ) 50 MG tablet Take 1 tablet (50 mg total) by mouth daily. 90 tablet 3   zolpidem  (AMBIEN ) 10 MG tablet Take 1 tablet (10 mg total) by mouth at bedtime as needed for sleep. 30 tablet 5   No current facility-administered medications for this visit.   No results found.  Review of Systems:   A ROS was performed including pertinent positives and negatives as documented in the HPI.  Physical Exam :   Constitutional: NAD and appears stated age Neurological: Alert and oriented Psych: Appropriate affect and cooperative There were no vitals taken for this visit.   Comprehensive Musculoskeletal Exam:    Tenderness about the left ischial tuberosity with resisted hamstring curl in particular.  There is evidence of intrasubstance tearing of the muscular belly of the left hamstring although he does have symptoms that are predominantly isolated to the insertional site of the proximal hamstring.  Manger of distal neurosensory exam is intact.  There is no obvious bruising   Imaging:      MRI (left femur): Insertional tearing of the proximal hamstring without evidence of retraction   I personally reviewed and interpreted the radiographs.   Assessment and Plan:   48 y.o. 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  At this time he has trialed physical therapy multiple times without relief.  He is hoping to stay active and continues to golf.  Given this we did discuss the possibility of an endoscopic proximal hamstring repair.  I did discuss the risks and limitations associated with this.  I did discuss recovery timeframe.  I discussed the tenderness to surgery including shockwave and PRP.  After discussion he is elected proceed with this  -plan for left proximal hamstring endoscopic repair    After a lengthy discussion of treatment options, including risks, benefits, alternatives, complications of surgical and nonsurgical conservative options, the patient elected surgical repair.   The patient  is aware of the material risks  and complications including, but not limited to injury to adjacent structures, neurovascular injury, infection, numbness, bleeding, implant failure, thermal burns, stiffness, persistent pain, failure to heal, disease transmission from allograft, need for further surgery, dislocation, anesthetic risks, blood clots, risks of death,and others. The probabilities of surgical success and failure discussed with patient given their particular co-morbidities.The time and nature of expected rehabilitation and recovery was discussed.The patient's questions were all answered preoperatively.  No barriers to understanding were noted. I explained the natural history of the disease process and Rx rationale.  I explained to the patient what I considered to be reasonable expectations given their personal situation.  The final treatment plan was arrived at through a shared patient decision making process model.  I personally saw and evaluated the  patient, and participated in the management and treatment plan.  Wilhelmenia Harada, MD Attending Physician, Orthopedic Surgery  This document was dictated using Dragon voice recognition software. A reasonable attempt at proof reading has been made to minimize errors.

## 2023-07-16 ENCOUNTER — Telehealth: Payer: Self-pay

## 2023-07-16 NOTE — Telephone Encounter (Signed)
 Surgical Clearance, to be filled out by provider. Patient requested to send it back via Fax within 5-days. Document is located in providers tray at front office.Please advise at Mobile [phone] (mobile)  Placed on PCP desk to review and sign, if appropriate.

## 2023-07-16 NOTE — Telephone Encounter (Signed)
 Spoke with pt and advised an appt would be needed for form completion. Pt scheduled for 5/14

## 2023-07-16 NOTE — Telephone Encounter (Signed)
 I have not seen him since June 2023. I will need to see him in the office before I can complete the form.

## 2023-07-30 ENCOUNTER — Other Ambulatory Visit: Payer: Self-pay | Admitting: Pulmonary Disease

## 2023-07-30 NOTE — Telephone Encounter (Signed)
**Note De-identified  [place] Obfuscation** Please advise 

## 2023-07-31 ENCOUNTER — Other Ambulatory Visit (HOSPITAL_COMMUNITY): Payer: Self-pay

## 2023-07-31 ENCOUNTER — Ambulatory Visit (INDEPENDENT_AMBULATORY_CARE_PROVIDER_SITE_OTHER): Admitting: Family Medicine

## 2023-07-31 ENCOUNTER — Encounter: Payer: Self-pay | Admitting: Family Medicine

## 2023-07-31 ENCOUNTER — Telehealth: Payer: Self-pay

## 2023-07-31 VITALS — BP 120/79 | HR 66 | Temp 99.0°F | Ht 73.0 in | Wt 305.8 lb

## 2023-07-31 DIAGNOSIS — Z0000 Encounter for general adult medical examination without abnormal findings: Secondary | ICD-10-CM

## 2023-07-31 DIAGNOSIS — I10 Essential (primary) hypertension: Secondary | ICD-10-CM | POA: Diagnosis not present

## 2023-07-31 DIAGNOSIS — Z125 Encounter for screening for malignant neoplasm of prostate: Secondary | ICD-10-CM

## 2023-07-31 DIAGNOSIS — E7800 Pure hypercholesterolemia, unspecified: Secondary | ICD-10-CM

## 2023-07-31 DIAGNOSIS — Z23 Encounter for immunization: Secondary | ICD-10-CM

## 2023-07-31 DIAGNOSIS — R7301 Impaired fasting glucose: Secondary | ICD-10-CM

## 2023-07-31 DIAGNOSIS — Z01818 Encounter for other preprocedural examination: Secondary | ICD-10-CM

## 2023-07-31 MED ORDER — SEMAGLUTIDE(0.25 OR 0.5MG/DOS) 2 MG/3ML ~~LOC~~ SOPN: PEN_INJECTOR | SUBCUTANEOUS | 0 refills | Status: DC

## 2023-07-31 NOTE — Patient Instructions (Signed)
PS

## 2023-07-31 NOTE — Telephone Encounter (Signed)

## 2023-07-31 NOTE — Progress Notes (Signed)
 Office Note 07/31/2023  CC:  Chief Complaint  Patient presents with   Surgical Clearance    No scheduled surgery date as of yet, will need completed clearance form first; pt is not fasting   Patient is a 48 y.o. male who is here for annual health maintenance exam and preoperative medical clearance for left proximal hamstring endoscopic repair. I last saw him in June 2023 for follow-up hypertension, insomnia, impaired fasting glucose, and hyperlipidemia. A/P as of last visit: "#1 hypertension, well controlled on losartan  50 mg a day. Electrolytes and creatinine today.  2.  Hyperlipidemia.  Patient suspects that rosuvastatin  elicits toe pain/gout and right foot. History seems more compatible with idiopathic gout flares, unrelated to statin. However, agree with cardiologist recent referral to their lipid clinic pharmacologist. Continue Zetia  10 mg a day--refilled today. Hepatic panel today. He will be getting fasting lipid panel with the lipid clinic.   #3 insomnia.  Good response to Lunesta  2 mg nightly. He feels like his impaired sleep may be related to uncontrolled sleep apnea.  He has follow-up with his pulmonologist soon. Refilled Lunesta  2 mg, 1 nightly as needed, #90, refill x 1."  INTERIM HX: Cody Cruz is feeling well other than chronic left hamstring pain.  His hyperlipidemia and hypertension have been followed and managed by Dr. Stann Earnest.  Takes Ambien  about once a week on average for insomnia. PMP AWARE reviewed today: most recent rx for Ambien  was filled 12/17/2022, # 30, rx by Dr. Gaynell Keeler. No red flags.  Past Medical History:  Diagnosis Date   Chest pain 07/2019   ACS ruled out. Stress test and echo normal.  BP was out of control at the time of his eval/hosp.   Esophageal abnormality 08/2019   mildly dilated distal esophagus + food debris in esoph noted on coronary CT calcium  scoring imaging--refer to GI for eval for possible esoph dysmotility--pt declined this as of  08/2019.   Essential hypertension    Gout    toes   Hamstring tear    Hyperlipidemia    statins->gout?  ok on pravastatin  2023   Insomnia    Mild intermittent asthma    activity induced, typically.  Allergies trigger as a kid.   Obesity, Class II, BMI 35-39.9    OSA on CPAP    [facility]- uses cpap nightly   Plantar fasciitis 01/2019   steroid inj, Dr. Celia Coles   Pulmonary nodules 08/2019   incidentally noted on coronary calcium  scoring imaging-> enlarging nodule on 1 yr f/u imaging->pulm eval->LN bx no malignant cells, bronch washings/lavage with some atypical cells favored to be reactive, no malignant cells.  CT 2024 stable.    Past Surgical History:  Procedure Laterality Date   APPENDECTOMY  1989   BRONCHIAL BRUSHINGS  09/27/2020   Procedure: BRONCHIAL BRUSHINGS;  Surgeon: Prudy Brownie, DO;  [facility];  Service: Pulmonary;;   BRONCHIAL NEEDLE ASPIRATION BIOPSY  09/27/2020   Procedure: BRONCHIAL NEEDLE ASPIRATION BIOPSIES;  Surgeon: Prudy Brownie, DO;  [facility];  Service: Pulmonary;;   BRONCHIAL WASHINGS  09/27/2020   Procedure: BRONCHIAL WASHINGS;  Surgeon: Prudy Brownie, DO;  [facility];  Service: Pulmonary;;   CARDIOVASCULAR STRESS TEST  07/31/2019   NO ISCHEMIA   COLONOSCOPY W/ POLYPECTOMY     06/2021, multiple adenomas, recall 3 years   coronary ct  08/2019   calcium  score ZERO.     TONSILLECTOMY AND ADENOIDECTOMY  1979   TRANSTHORACIC ECHOCARDIOGRAM  07/31/2019   normal  VIDEO BRONCHOSCOPY WITH ENDOBRONCHIAL NAVIGATION Right 09/27/2020   Procedure: VIDEO BRONCHOSCOPY WITH ENDOBRONCHIAL NAVIGATION;  Surgeon: Prudy Brownie, DO;  Location: [facility];  Service: Pulmonary;  Laterality: Right;   VIDEO BRONCHOSCOPY WITH ENDOBRONCHIAL ULTRASOUND N/A 09/27/2020   Procedure: VIDEO BRONCHOSCOPY WITH ENDOBRONCHIAL ULTRASOUND;  Surgeon: Prudy Brownie, DO;  Location: [facility];  Service: Pulmonary;  Laterality: N/A;    WISDOM TOOTH EXTRACTION      Family History  Problem Relation Age of Onset   Colon polyps Mother    Arthritis Mother    Hyperlipidemia Mother    Hypertension Mother    Colon cancer Father 64   Hypertension Father    CAD Maternal Uncle 37 - 104   Colon cancer Paternal Uncle 37   CAD Maternal Grandfather 37 - 55   Esophageal cancer Neg Hx    Rectal cancer Neg Hx    Stomach cancer Neg Hx     Social History   Socioeconomic History   Marital status: Married    Spouse name: Not on file   Number of children: Not on file   Years of education: Not on file   Highest education level: Master's degree (e.g., MA, MS, MEng, MEd, MSW, MBA)  Occupational History   Not on file  Tobacco Use   Smoking status: Never   Smokeless tobacco: Never  Vaping Use   Vaping status: Never Used  Substance and Sexual Activity   Alcohol use: Yes    Comment: 10-14   Drug use: No   Sexual activity: Not on file  Other Topics Concern   Not on file  Social History Narrative   Married, two children (one son and one daughter).   Education: [name] [place] [employer].   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   No tobacco.  Alc 3-4 beers per week avg.  No alc/drugs.   Exercise: CV and wt's at a GYM 3-5 days a week when feet are not hurting him.   Social Drivers of Corporate investment banker Strain: Low Risk  (07/27/2023)   Overall Financial Resource Strain (CARDIA)    Difficulty of Paying Living Expenses: Not very hard  Food Insecurity: No Food Insecurity (07/27/2023)   Hunger Vital Sign    Worried About Running Out of Food in the Last Year: Never true    Ran Out of Food in the Last Year: Never true  Transportation Needs: No Transportation Needs (07/27/2023)   PRAPARE - Administrator, Civil Service (Medical): No    Lack of Transportation (Non-Medical): No  Physical Activity: Insufficiently Active (07/27/2023)   Exercise Vital Sign    Days of Exercise per Week: 2 days    Minutes of  Exercise per Session: 30 min  Stress: Stress Concern Present (07/27/2023)   [employer] of Occupational Health - Occupational Stress Questionnaire    Feeling of Stress : To some extent  Social Connections: Moderately Isolated (07/27/2023)   Social Connection and Isolation Panel [NHANES]    Frequency of Communication with Friends and Family: Twice a week    Frequency of Social Gatherings with Friends and Family: Once a week    Attends Religious Services: Never    Database administrator or Organizations: No    Attends Engineer, structural: Not on file    Marital Status: Married  Catering manager Violence: Not on file    Outpatient Medications Prior to Visit  Medication Sig Dispense Refill   albuterol  (VENTOLIN  HFA) 108 (90 Base)  MCG/ACT inhaler Inhale 2 puffs into the lungs every 6 (six) hours as needed for wheezing or shortness of breath. 8 g 0   atorvastatin  (LIPITOR) 80 MG tablet Take 1 tablet (80 mg total) by mouth daily. 90 tablet 3   cetirizine (ZYRTEC) 10 MG chewable tablet Chew 10 mg by mouth daily.     ezetimibe  (ZETIA ) 10 MG tablet Take 1 tablet (10 mg total) by mouth daily. 90 tablet 3   losartan  (COZAAR ) 50 MG tablet Take 1 tablet (50 mg total) by mouth daily. 90 tablet 3   zolpidem  (AMBIEN ) 10 MG tablet TAKE 1 TABLET BY MOUTH AT BEDTIME AS NEEDED FOR SLEEP. 30 tablet 2   aspirin  EC 325 MG tablet Take 1 tablet (325 mg total) by mouth daily. (Patient not taking: Reported on 07/31/2023) 14 tablet 0   oxyCODONE  (ROXICODONE ) 5 MG immediate release tablet Take 1 tablet (5 mg total) by mouth every 4 (four) hours as needed for severe pain (pain score 7-10) or breakthrough pain. (Patient not taking: Reported on 07/31/2023) 10 tablet 0   aspirin  EC 81 MG EC tablet Take 1 tablet (81 mg total) by mouth daily. (Patient not taking: Reported on 07/31/2023) 150 tablet 3   No facility-administered medications prior to visit.    Allergies  Allergen Reactions   Statins Other (See  Comments)    ?gout    Review of Systems  Constitutional:  Negative for appetite change, chills, fatigue and fever.  HENT:  Negative for congestion, dental problem, ear pain and sore throat.   Eyes:  Negative for discharge, redness and visual disturbance.  Respiratory:  Negative for cough, chest tightness, shortness of breath and wheezing.   Cardiovascular:  Negative for chest pain, palpitations and leg swelling.  Gastrointestinal:  Negative for abdominal pain, blood in stool, diarrhea, nausea and vomiting.  Genitourinary:  Negative for difficulty urinating, dysuria, flank pain, frequency, hematuria and urgency.  Musculoskeletal:  Positive for arthralgias (intermittent MCP jt pain diffusely, R>L hand). Negative for back pain, joint swelling, myalgias and neck stiffness.  Skin:  Negative for pallor and rash.  Neurological:  Negative for dizziness, speech difficulty, weakness and headaches.  Hematological:  Negative for adenopathy. Does not bruise/bleed easily.  Psychiatric/Behavioral:  Negative for confusion and sleep disturbance. The patient is not nervous/anxious.     PE;    07/31/2023    8:40 AM 06/17/2023    8:55 AM 03/11/2023    8:07 AM  Vitals with BMI  Height 6\' 1"  6\' 1"  6\' 1"   Weight 305 lbs 13 oz 321 lbs 321 lbs 3 oz  BMI 40.35 42.36 42.39  Systolic 120  120  Diastolic 79  76  Pulse 66  56   Gen: Alert, well appearing.  Patient is oriented to person, place, time, and situation. AFFECT: pleasant, lucid thought and speech. ENT: Ears: EACs clear, normal epithelium.  TMs with good light reflex and landmarks bilaterally.  Eyes: no injection, icteris, swelling, or exudate.  EOMI, PERRLA. Nose: no drainage or turbinate edema/swelling.  No injection or focal lesion.  Mouth: lips without lesion/swelling.  Oral mucosa pink and moist.  Dentition intact and without obvious caries or gingival swelling.  Oropharynx without erythema, exudate, or swelling.  Neck: supple/nontender.  No LAD,  mass, or TM.  Carotid pulses 2+ bilaterally, without bruits. CV: RRR, no m/r/g.   LUNGS: CTA bilat, nonlabored resps, good aeration in all lung fields. ABD: soft, NT, ND, BS normal.  No hepatospenomegaly or mass.  No  bruits. EXT: no clubbing, cyanosis, or edema.  Musculoskeletal: no joint swelling, erythema, warmth, or tenderness.  ROM of all joints intact. Skin - no sores or suspicious lesions or rashes or color changes  Pertinent labs:  Lab Results  Component Value Date   TSH 2.38 11/11/2020   Lab Results  Component Value Date   WBC 6.3 07/31/2019   HGB 15.6 09/27/2020   HCT 46.0 09/27/2020   MCV 88.1 07/31/2019   PLT 224 07/31/2019   Lab Results  Component Value Date   CREATININE 1.00 08/23/2021   BUN 18 08/23/2021   NA 138 08/23/2021   K 4.2 08/23/2021   CL 101 08/23/2021   CO2 28 08/23/2021   Lab Results  Component Value Date   ALT 75 (H) 03/11/2023   AST 52 (H) 03/11/2023   ALKPHOS 101 03/11/2023   BILITOT 0.7 03/11/2023   Lab Results  Component Value Date   CHOL 164 03/11/2023   Lab Results  Component Value Date   HDL 41 03/11/2023   Lab Results  Component Value Date   LDLCALC 91 03/11/2023   Lab Results  Component Value Date   TRIG 189 (H) 03/11/2023   Lab Results  Component Value Date   CHOLHDL 4.0 03/11/2023   Lab Results  Component Value Date   HGBA1C 5.5 11/11/2020   ASSESSMENT AND PLAN:   No problem-specific Assessment & Plan notes found for this encounter.  1 Health maintenance exam: Reviewed age and gender appropriate health maintenance issues (prudent diet, regular exercise, health risks of tobacco and excessive alcohol, use of seatbelts, fire alarms in home, use of sunscreen).  Also reviewed age and gender appropriate health screening as well as vaccine recommendations. Vaccines: Tdap->given today.  Prevnar 20->deferred. Labs: HP labs, PSA, and hemoglobin A1c ordered future when fasting. Prostate ca screening: average risk patient=  as per latest guidelines, start screening at 50 yrs of age-->initial PSA ordered future. Colon ca screening: Multiple adenomatous/sessile serrated polyps, recall approximately April 2026.  #2 hypertension and hypercholesterolemia. Well-controlled, managed by cardiology. Lipid panel and complete metabolic panel ordered future.  #3 morbid obesity. It would be great for him to get on a GLP-1 agonist. Will prescribe Ozempic 0.5 mg subcu weekly. Therapeutic expectations and side effect profile of medication discussed today.  Patient's questions answered.  #4 left hamstring tear. He will be getting surgery soon and he is fully cleared from a medical standpoint.  An After Visit Summary was printed and given to the patient.  FOLLOW UP:  Return in about 1 year (around 07/30/2024) for annual CPE (fasting).  Signed:  Arletha Lady, MD           07/31/2023

## 2023-08-05 ENCOUNTER — Other Ambulatory Visit (INDEPENDENT_AMBULATORY_CARE_PROVIDER_SITE_OTHER)

## 2023-08-05 DIAGNOSIS — I10 Essential (primary) hypertension: Secondary | ICD-10-CM | POA: Diagnosis not present

## 2023-08-05 DIAGNOSIS — Z125 Encounter for screening for malignant neoplasm of prostate: Secondary | ICD-10-CM | POA: Diagnosis not present

## 2023-08-05 DIAGNOSIS — R7301 Impaired fasting glucose: Secondary | ICD-10-CM

## 2023-08-05 DIAGNOSIS — E7800 Pure hypercholesterolemia, unspecified: Secondary | ICD-10-CM

## 2023-08-05 LAB — COMPREHENSIVE METABOLIC PANEL WITH GFR
ALT: 55 U/L — ABNORMAL HIGH (ref 0–53)
AST: 37 U/L (ref 0–37)
Albumin: 4.5 g/dL (ref 3.5–5.2)
Alkaline Phosphatase: 84 U/L (ref 39–117)
BUN: 18 mg/dL (ref 6–23)
CO2: 30 meq/L (ref 19–32)
Calcium: 9.1 mg/dL (ref 8.4–10.5)
Chloride: 101 meq/L (ref 96–112)
Creatinine, Ser: 0.98 mg/dL (ref 0.40–1.50)
GFR: 90.25 mL/min (ref >60.00)
Glucose, Bld: 101 mg/dL — ABNORMAL HIGH (ref 70–99)
Potassium: 4.7 meq/L (ref 3.5–5.1)
Sodium: 138 meq/L (ref 135–145)
Total Bilirubin: 0.7 mg/dL (ref 0.2–1.2)
Total Protein: 6.7 g/dL (ref 6.0–8.3)

## 2023-08-05 LAB — LIPID PANEL
Cholesterol: 147 mg/dL (ref 0–200)
HDL: 41.8 mg/dL (ref >39.00)
LDL Cholesterol: 74 mg/dL (ref 0–99)
NonHDL: 105.37
Total CHOL/HDL Ratio: 4
Triglycerides: 156 mg/dL — ABNORMAL HIGH (ref 0.0–149.0)
VLDL: 31.2 mg/dL (ref 0.0–40.0)

## 2023-08-05 LAB — CBC WITH DIFFERENTIAL/PLATELET
Basophils Absolute: 0 10*3/uL (ref 0.0–0.1)
Basophils Relative: 0.6 % (ref 0.0–3.0)
Eosinophils Absolute: 0.2 10*3/uL (ref 0.0–0.7)
Eosinophils Relative: 2.5 % (ref 0.0–5.0)
HCT: 42.7 % (ref 39.0–52.0)
Hemoglobin: 14.6 g/dL (ref 13.0–17.0)
Lymphocytes Relative: 28 % (ref 12.0–46.0)
Lymphs Abs: 1.7 10*3/uL (ref 0.7–4.0)
MCHC: 34.1 g/dL (ref 30.0–36.0)
MCV: 88.8 fl (ref 78.0–100.0)
Monocytes Absolute: 0.6 10*3/uL (ref 0.1–1.0)
Monocytes Relative: 10.3 % (ref 3.0–12.0)
Neutro Abs: 3.6 10*3/uL (ref 1.4–7.7)
Neutrophils Relative %: 58.6 % (ref 43.0–77.0)
Platelets: 240 10*3/uL (ref 150.0–400.0)
RBC: 4.81 Mil/uL (ref 4.22–5.81)
RDW: 12.9 % (ref 11.5–15.5)
WBC: 6.2 10*3/uL (ref 4.0–10.5)

## 2023-08-05 LAB — HEMOGLOBIN A1C: Hgb A1c MFr Bld: 5.7 % (ref 4.6–6.5)

## 2023-08-06 LAB — PSA: PSA: 0.41 ng/mL (ref 0.10–4.00)

## 2023-08-06 LAB — TSH: TSH: 1.15 u[IU]/mL (ref 0.35–5.50)

## 2023-08-07 ENCOUNTER — Ambulatory Visit

## 2023-08-07 ENCOUNTER — Ambulatory Visit: Payer: Self-pay | Admitting: Family Medicine

## 2023-08-07 DIAGNOSIS — R7401 Elevation of levels of liver transaminase levels: Secondary | ICD-10-CM

## 2023-08-08 LAB — HEPATITIS B CORE ANTIBODY, IGM: Hep B C IgM: NONREACTIVE

## 2023-08-08 LAB — HEPATITIS B SURFACE ANTIGEN: Hepatitis B Surface Ag: NONREACTIVE

## 2023-08-08 LAB — HEPATITIS C ANTIBODY: Hepatitis C Ab: NONREACTIVE

## 2023-08-09 ENCOUNTER — Other Ambulatory Visit: Payer: Self-pay | Admitting: Family Medicine

## 2023-08-09 MED ORDER — TIRZEPATIDE-WEIGHT MANAGEMENT 2.5 MG/0.5ML ~~LOC~~ SOLN: 2.5000 mg | SUBCUTANEOUS | 0 refills | Status: DC

## 2023-08-09 MED ORDER — TIRZEPATIDE-WEIGHT MANAGEMENT 2.5 MG/0.5ML ~~LOC~~ SOAJ: 2.5000 mg | SUBCUTANEOUS | 0 refills | Status: DC

## 2023-08-09 NOTE — Telephone Encounter (Signed)
 Please Advise

## 2023-08-09 NOTE — Addendum Note (Signed)
 Addended by: Shelvia Dick on: 08/09/2023 02:57 PM   Modules accepted: Orders

## 2023-08-09 NOTE — Telephone Encounter (Signed)
 Okay. Zepbound prescription sent

## 2023-08-13 ENCOUNTER — Encounter: Payer: Self-pay | Admitting: Family Medicine

## 2023-08-14 ENCOUNTER — Other Ambulatory Visit (HOSPITAL_BASED_OUTPATIENT_CLINIC_OR_DEPARTMENT_OTHER): Payer: Self-pay | Admitting: Orthopaedic Surgery

## 2023-08-14 DIAGNOSIS — S76312S Strain of muscle, fascia and tendon of the posterior muscle group at thigh level, left thigh, sequela: Secondary | ICD-10-CM

## 2023-08-19 MED ORDER — TIRZEPATIDE 2.5 MG/0.5ML ~~LOC~~ SOAJ: 2.5000 mg | SUBCUTANEOUS | 0 refills | Status: DC

## 2023-08-19 NOTE — Telephone Encounter (Signed)
 Mounjaro rx sent

## 2023-08-19 NOTE — Telephone Encounter (Signed)
 If clinically appropriate, alternative options such as Saxenda, Zepbound , or Wegovy  may be considered for this patient.

## 2023-08-19 NOTE — Addendum Note (Signed)
 Addended by: Shelvia Dick on: 08/19/2023 09:40 AM   Modules accepted: Orders

## 2023-08-23 ENCOUNTER — Telehealth: Payer: Self-pay

## 2023-08-23 ENCOUNTER — Other Ambulatory Visit (HOSPITAL_COMMUNITY): Payer: Self-pay

## 2023-08-23 MED ORDER — TIRZEPATIDE-WEIGHT MANAGEMENT 2.5 MG/0.5ML ~~LOC~~ SOAJ: 2.5000 mg | SUBCUTANEOUS | 0 refills | Status: DC

## 2023-08-23 NOTE — Telephone Encounter (Signed)
 Please confirm Zepbound  strength and dosing, new rx needed for PA

## 2023-08-23 NOTE — Telephone Encounter (Signed)
 Pharmacy Patient Advocate Encounter   Received notification from CoverMyMeds that prior authorization for Zepbound  2.5MG /0.5ML pen-injectors  is required/requested.   Insurance verification completed.   The patient is insured through [facility] .   Per test claim: PA required; PA started via CoverMyMeds. KEY [id number] . Waiting for clinical questions to populate.

## 2023-08-23 NOTE — Telephone Encounter (Signed)
 Noted

## 2023-08-23 NOTE — Telephone Encounter (Signed)
 Yes do PA pls

## 2023-08-23 NOTE — Telephone Encounter (Signed)
 Please review and advise.

## 2023-08-23 NOTE — Telephone Encounter (Signed)
 New rx sent in

## 2023-08-23 NOTE — Telephone Encounter (Signed)
 Please assist with PA.

## 2023-08-23 NOTE — Telephone Encounter (Signed)
 Message forwarded to provider, see other encounter.

## 2023-08-23 NOTE — Telephone Encounter (Signed)
 Please assist with PA for [facility]

## 2023-08-23 NOTE — Telephone Encounter (Signed)
 PLEASE BE ADVISED Clinical questions have been answered and PA submitted.TO PLAN. PA currently Pending.

## 2023-08-23 NOTE — Telephone Encounter (Signed)
 PER TEST CLAIM LOOK LIKE IT IS EXCLUDED FROM PLAN FOR ZEPBOUND  BUT I CAN STILL TRY FOR PA FOR IT.  I WILL DO TEST CLAIM FOR MOUNJARO  AS WELL REQUIRE A PA BUT IT NOT DIABETIC MELLITUS TYPE 2 IT WILL BE DENIED  SEE BELOW  Sloane Junkin (Key: [id number]) - [id number] Mounjaro  2.5MG /0.5ML auto-injectors status: New - DeniedCreated: June 2nd, 2025 [phone] Denied Medical criteria not met

## 2023-08-23 NOTE — Telephone Encounter (Signed)
 Please do prescription for Zepbound  2.5 mg q. 7 days, 1 month supply, no refill.

## 2023-08-23 NOTE — Addendum Note (Signed)
 Addended by: Terris Fickle D on: 08/23/2023 02:37 PM   Modules accepted: Orders

## 2023-08-26 ENCOUNTER — Encounter (HOSPITAL_BASED_OUTPATIENT_CLINIC_OR_DEPARTMENT_OTHER): Payer: Self-pay | Admitting: Orthopaedic Surgery

## 2023-08-26 ENCOUNTER — Other Ambulatory Visit: Payer: Self-pay

## 2023-08-26 NOTE — Progress Notes (Signed)
   08/26/23 1301  PAT Phone Screen  Is the patient taking a GLP-1 receptor agonist? (S)  No (has it ordered but is waiting until p sx to start)  Do You Have Diabetes? No  Do You Have Hypertension? Yes  Have You Ever Been to the ER for Asthma? No  Have You Taken Oral Steroids in the Past 3 Months? No  Do you Take Phenteramine or any Other Diet Drugs? No  Recent  Lab Work, EKG, CXR? Yes  Where was this test performed? 03-11-23 EKG [facility] 08/05/23  Cardiologist Name Dr Stann Earnest for HTN last OV 03/11/23  Have you ever had tests on your heart? Yes  What cardiac tests were performed? Echo  What date/year were cardiac tests completed? 07/31/19 EF 60-65%  Results viewable: [facility] Media Tab  Any Recent Hospitalizations? No  Height 6\' 1"  (1.854 m)  Weight (!) 138 kg  Pat Appointment Scheduled No  Reason for No Appointment Not Needed   Medical clearance placed on chart.

## 2023-08-27 ENCOUNTER — Other Ambulatory Visit: Payer: Self-pay | Admitting: *Deleted

## 2023-08-27 MED ORDER — ATORVASTATIN CALCIUM 80 MG PO TABS: 80.0000 mg | ORAL_TABLET | Freq: Every day | ORAL | 2 refills | Status: DC

## 2023-08-27 MED ORDER — EZETIMIBE 10 MG PO TABS: 10.0000 mg | ORAL_TABLET | Freq: Every day | ORAL | 2 refills | Status: DC

## 2023-08-28 NOTE — Telephone Encounter (Signed)
 Pt notified via My Chart

## 2023-08-28 NOTE — Telephone Encounter (Signed)
 Other encounter updated, pt was notified

## 2023-08-28 NOTE — Telephone Encounter (Signed)
 Pharmacy Patient Advocate Encounter  Received notification from [employer] that Prior Authorization for Zepbound  2.5MG /0.5ML  has been DENIED.  See denial reason below. No denial letter attached in CMM. Will attach denial letter to Media tab once received. The requested medication and/or diagnosis are not a covered benefit and excluded from coverage in accordance with the terms and conditions of your plan benefit. Therefore, the request has been ADMINISTRATIVELY DENIED.    PA #/Case ID/Reference #: [id number].

## 2023-08-30 NOTE — Progress Notes (Signed)

## 2023-09-02 ENCOUNTER — Ambulatory Visit (HOSPITAL_COMMUNITY)

## 2023-09-02 ENCOUNTER — Encounter (HOSPITAL_BASED_OUTPATIENT_CLINIC_OR_DEPARTMENT_OTHER)
Admission: RE | Disposition: A | Discharge status: Home / Self Care | Payer: Self-pay | Source: Ambulatory Visit | Attending: Orthopaedic Surgery

## 2023-09-02 ENCOUNTER — Encounter (HOSPITAL_BASED_OUTPATIENT_CLINIC_OR_DEPARTMENT_OTHER): Payer: Self-pay | Admitting: Orthopaedic Surgery

## 2023-09-02 ENCOUNTER — Ambulatory Visit (HOSPITAL_BASED_OUTPATIENT_CLINIC_OR_DEPARTMENT_OTHER): Admitting: Anesthesiology

## 2023-09-02 ENCOUNTER — Other Ambulatory Visit: Payer: Self-pay

## 2023-09-02 ENCOUNTER — Ambulatory Visit (HOSPITAL_BASED_OUTPATIENT_CLINIC_OR_DEPARTMENT_OTHER)
Admission: RE | Admit: 2023-09-02 | Discharge: 2023-09-02 | Disposition: A | Discharge status: Home / Self Care | Source: Ambulatory Visit | Attending: Orthopaedic Surgery | Admitting: Orthopaedic Surgery

## 2023-09-02 DIAGNOSIS — X509XXA Other and unspecified overexertion or strenuous movements or postures, initial encounter: Secondary | ICD-10-CM | POA: Insufficient documentation

## 2023-09-02 DIAGNOSIS — G4733 Obstructive sleep apnea (adult) (pediatric): Secondary | ICD-10-CM | POA: Insufficient documentation

## 2023-09-02 DIAGNOSIS — Z6841 Body Mass Index (BMI) 40.0 and over, adult: Secondary | ICD-10-CM | POA: Insufficient documentation

## 2023-09-02 DIAGNOSIS — S76312D Strain of muscle, fascia and tendon of the posterior muscle group at thigh level, left thigh, subsequent encounter: Secondary | ICD-10-CM

## 2023-09-02 DIAGNOSIS — S76312A Strain of muscle, fascia and tendon of the posterior muscle group at thigh level, left thigh, initial encounter: Secondary | ICD-10-CM

## 2023-09-02 DIAGNOSIS — I10 Essential (primary) hypertension: Secondary | ICD-10-CM

## 2023-09-02 DIAGNOSIS — E66813 Obesity, class 3: Secondary | ICD-10-CM | POA: Insufficient documentation

## 2023-09-02 DIAGNOSIS — S76012A Strain of muscle, fascia and tendon of left hip, initial encounter: Secondary | ICD-10-CM | POA: Insufficient documentation

## 2023-09-02 DIAGNOSIS — S76312S Strain of muscle, fascia and tendon of the posterior muscle group at thigh level, left thigh, sequela: Secondary | ICD-10-CM

## 2023-09-02 HISTORY — PX: REPAIR QUADRICEPS/HAMSTRING MUSCLES: SHX6563

## 2023-09-02 SURGERY — REPAIR, MUSCLE, QUADRICEPS OR HAMSTRING
Anesthesia: General | Laterality: Left

## 2023-09-02 MED ORDER — ACETAMINOPHEN 500 MG PO TABS
1000.0000 mg | ORAL_TABLET | Freq: Once | ORAL | Status: AC
  Administered 2023-09-02: 1000 mg via ORAL

## 2023-09-02 MED ORDER — DEXAMETHASONE SODIUM PHOSPHATE 10 MG/ML IJ SOLN
INTRAMUSCULAR | Status: DC | PRN
  Administered 2023-09-02: 5 mg via INTRAVENOUS

## 2023-09-02 MED ORDER — OXYCODONE HCL 5 MG PO TABS
5.0000 mg | ORAL_TABLET | Freq: Once | ORAL | Status: AC | PRN
  Administered 2023-09-02: 5 mg via ORAL

## 2023-09-02 MED ORDER — ATROPINE SULFATE 0.4 MG/ML IV SOLN
INTRAVENOUS | Status: AC
  Filled 2023-09-02: qty 1

## 2023-09-02 MED ORDER — TRANEXAMIC ACID-NACL 1000-0.7 MG/100ML-% IV SOLN
INTRAVENOUS | Status: AC
Start: 2023-09-02 — End: 2023-09-02
  Filled 2023-09-02: qty 100

## 2023-09-02 MED ORDER — SODIUM CHLORIDE (PF) 0.9 % IJ SOLN
INTRAMUSCULAR | Status: DC | PRN
  Administered 2023-09-02: 3000 mL

## 2023-09-02 MED ORDER — CEFAZOLIN SODIUM-DEXTROSE 3-4 GM/150ML-% IV SOLN
3.0000 g | INTRAVENOUS | Status: AC
  Administered 2023-09-02: 3 g via INTRAVENOUS

## 2023-09-02 MED ORDER — KETOROLAC TROMETHAMINE 30 MG/ML IJ SOLN
INTRAMUSCULAR | Status: AC
  Filled 2023-09-02: qty 1

## 2023-09-02 MED ORDER — OXYCODONE HCL 5 MG PO TABS
ORAL_TABLET | ORAL | Status: AC
  Filled 2023-09-02: qty 1

## 2023-09-02 MED ORDER — PROPOFOL 10 MG/ML IV BOLUS
INTRAVENOUS | Status: DC | PRN
  Administered 2023-09-02: 200 mg via INTRAVENOUS

## 2023-09-02 MED ORDER — ONDANSETRON HCL 4 MG/2ML IJ SOLN
INTRAMUSCULAR | Status: DC | PRN
  Administered 2023-09-02: 4 mg via INTRAVENOUS

## 2023-09-02 MED ORDER — HYDROMORPHONE HCL 1 MG/ML IJ SOLN
INTRAMUSCULAR | Status: AC
  Filled 2023-09-02: qty 0.5

## 2023-09-02 MED ORDER — KETOROLAC TROMETHAMINE 30 MG/ML IJ SOLN
INTRAMUSCULAR | Status: DC | PRN
  Administered 2023-09-02: 30 mg via INTRAVENOUS

## 2023-09-02 MED ORDER — KETOROLAC TROMETHAMINE 30 MG/ML IJ SOLN: 30.0000 mg | Freq: Once | INTRAMUSCULAR | Status: DC | PRN

## 2023-09-02 MED ORDER — ACETAMINOPHEN 500 MG PO TABS
ORAL_TABLET | ORAL | Status: AC
  Filled 2023-09-02: qty 2

## 2023-09-02 MED ORDER — CEFAZOLIN SODIUM-DEXTROSE 3-4 GM/150ML-% IV SOLN
INTRAVENOUS | Status: AC
  Filled 2023-09-02: qty 150

## 2023-09-02 MED ORDER — LIDOCAINE 2% (20 MG/ML) 5 ML SYRINGE
INTRAMUSCULAR | Status: DC | PRN
Start: 2023-09-02 — End: 2023-09-02

## 2023-09-02 MED ORDER — ROCURONIUM BROMIDE 10 MG/ML (PF) SYRINGE
PREFILLED_SYRINGE | INTRAVENOUS | Status: DC | PRN
  Administered 2023-09-02: 80 mg via INTRAVENOUS

## 2023-09-02 MED ORDER — SUCCINYLCHOLINE CHLORIDE 200 MG/10ML IV SOSY
PREFILLED_SYRINGE | INTRAVENOUS | Status: AC
  Filled 2023-09-02: qty 10

## 2023-09-02 MED ORDER — ONDANSETRON HCL 4 MG/2ML IJ SOLN
INTRAMUSCULAR | Status: AC
  Filled 2023-09-02: qty 2

## 2023-09-02 MED ORDER — MIDAZOLAM HCL 5 MG/5ML IJ SOLN
INTRAMUSCULAR | Status: DC | PRN
  Administered 2023-09-02: 2 mg via INTRAVENOUS

## 2023-09-02 MED ORDER — GABAPENTIN 300 MG PO CAPS
300.0000 mg | ORAL_CAPSULE | Freq: Once | ORAL | Status: AC
  Administered 2023-09-02: 300 mg via ORAL

## 2023-09-02 MED ORDER — FENTANYL CITRATE (PF) 100 MCG/2ML IJ SOLN
INTRAMUSCULAR | Status: AC
  Filled 2023-09-02: qty 2

## 2023-09-02 MED ORDER — MIDAZOLAM HCL 2 MG/2ML IJ SOLN
INTRAMUSCULAR | Status: AC
  Filled 2023-09-02: qty 2

## 2023-09-02 MED ORDER — AMISULPRIDE (ANTIEMETIC) 5 MG/2ML IV SOLN: 10.0000 mg | Freq: Once | INTRAVENOUS | Status: DC | PRN

## 2023-09-02 MED ORDER — DEXAMETHASONE SODIUM PHOSPHATE 10 MG/ML IJ SOLN
INTRAMUSCULAR | Status: AC
  Filled 2023-09-02: qty 1

## 2023-09-02 MED ORDER — SUGAMMADEX SODIUM 200 MG/2ML IV SOLN
INTRAVENOUS | Status: DC | PRN
  Administered 2023-09-02: 200 mg via INTRAVENOUS

## 2023-09-02 MED ORDER — GABAPENTIN 300 MG PO CAPS
ORAL_CAPSULE | ORAL | Status: AC
  Filled 2023-09-02: qty 1

## 2023-09-02 MED ORDER — PROPOFOL 10 MG/ML IV BOLUS
INTRAVENOUS | Status: AC
  Filled 2023-09-02: qty 20

## 2023-09-02 MED ORDER — ONDANSETRON HCL 4 MG/2ML IJ SOLN: 4.0000 mg | Freq: Once | INTRAMUSCULAR | Status: DC | PRN

## 2023-09-02 MED ORDER — LACTATED RINGERS IV SOLN: INTRAVENOUS | Status: DC | PRN

## 2023-09-02 MED ORDER — HYDROMORPHONE HCL 1 MG/ML IJ SOLN
0.2500 mg | INTRAMUSCULAR | Status: DC | PRN
  Administered 2023-09-02 (×2): 0.5 mg via INTRAVENOUS

## 2023-09-02 MED ORDER — ROCURONIUM BROMIDE 10 MG/ML (PF) SYRINGE
PREFILLED_SYRINGE | INTRAVENOUS | Status: AC
  Filled 2023-09-02: qty 10

## 2023-09-02 MED ORDER — OXYCODONE HCL 5 MG/5ML PO SOLN: 5.0000 mg | Freq: Once | ORAL | Status: AC | PRN

## 2023-09-02 MED ORDER — LIDOCAINE 2% (20 MG/ML) 5 ML SYRINGE
INTRAMUSCULAR | Status: AC
  Filled 2023-09-02: qty 5

## 2023-09-02 MED ORDER — TRANEXAMIC ACID-NACL 1000-0.7 MG/100ML-% IV SOLN
1000.0000 mg | INTRAVENOUS | Status: AC
  Administered 2023-09-02: 1000 mg via INTRAVENOUS

## 2023-09-02 MED ORDER — ROCURONIUM BROMIDE 10 MG/ML (PF) SYRINGE
PREFILLED_SYRINGE | INTRAVENOUS | Status: AC
  Filled 2023-09-02: qty 20

## 2023-09-02 MED ORDER — FENTANYL CITRATE (PF) 100 MCG/2ML IJ SOLN
INTRAMUSCULAR | Status: DC | PRN
  Administered 2023-09-02: 100 ug via INTRAVENOUS

## 2023-09-02 SURGICAL SUPPLY — 65 items
ANCHOR SUT 1.4 FLEX (Anchor) IMPLANT
BENZOIN TINCTURE PRP APPL 2/3 (GAUZE/BANDAGES/DRESSINGS) IMPLANT
BLADE EXCALIBUR 4.0X13 (MISCELLANEOUS) IMPLANT
BLADE SURG 15 STRL LF DISP TIS (BLADE) ×1 IMPLANT
BNDG ELASTIC 4INX 5YD STR LF (GAUZE/BANDAGES/DRESSINGS) ×1 IMPLANT
BNDG ELASTIC 6INX 5YD STR LF (GAUZE/BANDAGES/DRESSINGS) ×1 IMPLANT
CANNULA TWIST IN 8.25X7CM (CANNULA) IMPLANT
CHLORAPREP W/TINT 26 (MISCELLANEOUS) ×1 IMPLANT
COOLER ICEMAN CLASSIC (MISCELLANEOUS) ×1 IMPLANT
DISSECTOR 3.8MM X 13CM (MISCELLANEOUS) ×1 IMPLANT
DRAPE C-ARM 42X72 X-RAY (DRAPES) ×1 IMPLANT
DRAPE C-ARMOR (DRAPES) ×1 IMPLANT
DRAPE IMP U-DRAPE 54X76 (DRAPES) IMPLANT
DRAPE INCISE IOBAN 66X45 STRL (DRAPES) IMPLANT
DRAPE STERI IOBAN 125X83 (DRAPES) IMPLANT
DRAPE U-SHAPE 47X51 STRL (DRAPES) ×1 IMPLANT
DRAPE-T ARTHROSCOPY W/POUCH (DRAPES) ×1 IMPLANT
DRSG TEGADERM 4X4.75 (GAUZE/BANDAGES/DRESSINGS) ×3 IMPLANT
DW OUTFLOW CASSETTE/TUBE SET (MISCELLANEOUS) IMPLANT
ELECTRODE REM PT RTRN 9FT ADLT (ELECTROSURGICAL) ×1 IMPLANT
EXCALIBUR 3.8MM X 13CM (MISCELLANEOUS) IMPLANT
FEE RENTAL EQUIP HIP INSTR KIT (INSTRUMENTS) IMPLANT
GAUZE 4X4 16PLY ~~LOC~~+RFID DBL (SPONGE) IMPLANT
GAUZE PAD ABD 8X10 STRL (GAUZE/BANDAGES/DRESSINGS) ×1 IMPLANT
GAUZE SPONGE 4X4 12PLY STRL (GAUZE/BANDAGES/DRESSINGS) ×1 IMPLANT
GAUZE XEROFORM 1X8 LF (GAUZE/BANDAGES/DRESSINGS) ×1 IMPLANT
GLOVE BIO SURGEON STRL SZ 6 (GLOVE) ×1 IMPLANT
GLOVE BIO SURGEON STRL SZ7.5 (GLOVE) ×2 IMPLANT
GLOVE BIOGEL PI IND STRL 6.5 (GLOVE) ×1 IMPLANT
GLOVE BIOGEL PI IND STRL 8 (GLOVE) ×1 IMPLANT
GOWN STRL REUS W/ TWL LRG LVL3 (GOWN DISPOSABLE) ×1 IMPLANT
GOWN STRL REUS W/ TWL XL LVL3 (GOWN DISPOSABLE) ×1 IMPLANT
INSTRUMENT ORTHO TEXT HIP FEM (INSTRUMENTS) IMPLANT
KIT TRANSTIBIAL (DISPOSABLE) ×1 IMPLANT
MANIFOLD NEPTUNE II (INSTRUMENTS) ×1 IMPLANT
NDL HYPO 18GX1.5 BLUNT FILL (NEEDLE) IMPLANT
NDL INJECTOR II CARTRIDGE (MISCELLANEOUS) IMPLANT
NDL SUT 6 .5 CRC .975X.05 MAYO (NEEDLE) IMPLANT
NEEDLE HYPO 18GX1.5 BLUNT FILL (NEEDLE) IMPLANT
NEEDLE INJECTOR II CARTRIDGE (MISCELLANEOUS) ×1 IMPLANT
PACK ARTHROSCOPY DSU (CUSTOM PROCEDURE TRAY) ×1 IMPLANT
PACK BASIN DAY SURGERY FS (CUSTOM PROCEDURE TRAY) ×1 IMPLANT
PAD COLD SHLDR WRAP-ON (PAD) ×1 IMPLANT
PADDING CAST COTTON 6X4 STRL (CAST SUPPLIES) IMPLANT
PASSER SUT 70D UP ANGLED (INSTRUMENTS) IMPLANT
PENCIL SMOKE EVACUATOR (MISCELLANEOUS) ×1 IMPLANT
SLEEVE SCD COMPRESS KNEE MED (STOCKING) ×1 IMPLANT
SPONGE T-LAP 18X18 ~~LOC~~+RFID (SPONGE) ×1 IMPLANT
STRIP CLOSURE SKIN 1/2X4 (GAUZE/BANDAGES/DRESSINGS) IMPLANT
SUCTION TUBE FRAZIER 10FR DISP (SUCTIONS) ×1 IMPLANT
SUT ETHILON 3 0 PS 1 (SUTURE) ×1 IMPLANT
SUT MNCRL AB 3-0 PS2 27 (SUTURE) ×1 IMPLANT
SUT VIC AB 0 CT1 27XBRD ANBCTR (SUTURE) IMPLANT
SUT VIC AB 2-0 CT1 TAPERPNT 27 (SUTURE) IMPLANT
SUT VIC AB 3-0 FS2 27 (SUTURE) IMPLANT
SUTURE 0 FIBERLP 38 BLU TPR ND (SUTURE) IMPLANT
SUTURE FIBERWR #2 38 T-5 BLUE (SUTURE) ×1 IMPLANT
SUTURE TAPE 1.3 FIBERLOP 20 ST (SUTURE) ×1 IMPLANT
TOWEL GREEN STERILE FF (TOWEL DISPOSABLE) ×2 IMPLANT
TRAY ARTHROSCOPY HIP STRE FEE (INSTRUMENTS) ×1 IMPLANT
TRAY PIVOT PORT STRE FEE (INSTRUMENTS) ×1 IMPLANT
TUBE CONNECTING 20X1/4 (TUBING) IMPLANT
TUBING ARTHROSCOPY IRRIG 16FT (MISCELLANEOUS) ×1 IMPLANT
WAND ABLATOR APOLLO I90 (BUR) ×1 IMPLANT
YANKAUER SUCT BULB TIP NO VENT (SUCTIONS) ×1 IMPLANT

## 2023-09-02 NOTE — Anesthesia Postprocedure Evaluation (Signed)
 Anesthesia Post Note  Patient: Cody Cruz  Procedure(s) Performed: REPAIR, MUSCLE, QUADRICEPS OR HAMSTRING (Left)     Patient location during evaluation: PACU Anesthesia Type: General Level of consciousness: awake and alert, oriented and patient cooperative Pain management: pain level controlled Vital Signs Assessment: post-procedure vital signs reviewed and stable Respiratory status: spontaneous breathing, nonlabored ventilation and respiratory function stable Cardiovascular status: blood pressure returned to baseline and stable Postop Assessment: no apparent nausea or vomiting Anesthetic complications: no   No notable events documented.  Last Vitals:  Vitals:   09/02/23 1515 09/02/23 1544  BP: 125/69 128/80  Pulse: (!) 53 60  Resp: (!) 9 18  Temp:  (!) 36.3 C  SpO2: 92% 93%    Last Pain:  Vitals:   09/02/23 1544  PainSc: 4                  Jacquelyne Matte

## 2023-09-02 NOTE — Op Note (Signed)
   Date of Surgery: 09/02/2023  INDICATIONS: Mr. Dauzat is a 48 y.o.-year-old male with with symptomatic left proximal hamstring tear.  The risk and benefits of the procedure were discussed in detail and documented in the pre-operative evaluation.   PREOPERATIVE DIAGNOSIS: 1.  Left insertional proximal hamstring tear  POSTOPERATIVE DIAGNOSIS: Same.  PROCEDURE: 1.  Left proximal hamstring repair  SURGEON: Carmina Chris MD  ASSISTANT: Deon Flatter, ATC  ANESTHESIA:  general  IV FLUIDS AND URINE: See anesthesia record.  ANTIBIOTICS: Ancef   ESTIMATED BLOOD LOSS: 5 mL.  IMPLANTS:  Implant Name Type Inv. Item Serial No. Manufacturer Lot No. LRB No. Used Action  ANCHOR SUT 1.4 FLEX - [id number] Anchor ANCHOR SUT 1.4 FLEX  STRYKER ENDOSCOPY [id number] Left 2 Implanted    DRAINS: None  CULTURES: None  COMPLICATIONS: none  DESCRIPTION OF PROCEDURE:   The patient was identified in the preoperative holding area.  The correct site was marked according to universal protocol.  He subsequently taken back to the operating room.  Anesthesia was induced.  Antibiotics were given 1 hour prior to skin incision.  He was placed in the prone position with all bony prominences padded.   At this time fluoroscopy confirmed adequate imaging of the ischial tuberosity as well as the native insertion of the hamstring origin.  Final timeout was performed.  Needle localization was performed in order to establish the medial and lateral portals right proximal to the gluteal crease.  These were again done under direct visualization with the aid of fluoroscopy.  11 blade was used to incise just through skin and hemostat was had to spread down to bone of the ischial tuberosity.  Eilene Grater was introduced directly over the tendon long bone and used to open up the area.  Care was taken to place the hand over the leg to ensure no sciatic nerve activity.  The switching stick was introduced to break up some tissue over the  distal tendinous insertion.  This was partially ruptured at its insertion.  At this time a anchor was introduced medially and then laterally with 2 Nano tack anchors.  Using this lead I then placed a simple style repair suture through the tendinous tissue using the slingshot.  This was tied.  Both these had excellent apposition of the tendon onto the native origin of the hamstring.  This concluded the case.  All counts were correct at the end of the case.  3-0 Monocryl was used to close the portals.  Dressing was applied with Xeroform gauze and a Tegaderm.  He was taken the PACU without complication    POSTOPERATIVE PLAN: He will be weightbearing as tolerated on the left leg.  He will follow the hamstring repair protocol.  I will see him back in 2 weeks for wound assessment.  He will be on aspirin  325 until then.   Carmina Chris, MD 2:51 PM

## 2023-09-02 NOTE — Anesthesia Procedure Notes (Signed)
 Procedure Name: Intubation Date/Time: 09/02/2023 1:23 PM  Performed by: Glorya Larsson, CRNAPre-anesthesia Checklist: Patient identified, Emergency Drugs available, Suction available and Patient being monitored Patient Re-evaluated:Patient Re-evaluated prior to induction Oxygen  Delivery Method: Circle System Utilized Preoxygenation: Pre-oxygenation with 100% oxygen  Induction Type: IV induction Ventilation: Mask ventilation without difficulty Laryngoscope Size: Mac and 4 Grade View: Grade I Tube type: Oral Tube size: 7.5 mm Number of attempts: 1 Airway Equipment and Method: Stylet Placement Confirmation: ETT inserted through vocal cords under direct vision, positive ETCO2 and breath sounds checked- equal and bilateral Secured at: 23 cm Tube secured with: Tape Dental Injury: Teeth and Oropharynx as per pre-operative assessment

## 2023-09-02 NOTE — Anesthesia Preprocedure Evaluation (Addendum)
 Anesthesia Evaluation  Patient identified by MRN, date of birth, ID band Patient awake    Reviewed: Allergy & Precautions, H&P , NPO status , Patient's Chart, lab work & pertinent test results  Airway Mallampati: IV  TM Distance: >3 FB Neck ROM: Full    Dental  (+) Teeth Intact, Dental Advisory Given, Chipped,    Pulmonary asthma (rarely uses rescue inhaler) , sleep apnea and Continuous Positive Airway Pressure Ventilation    Pulmonary exam normal breath sounds clear to auscultation       Cardiovascular hypertension (136/80 preop), Pt. on medications Normal cardiovascular exam Rhythm:Regular Rate:Normal     Neuro/Psych negative neurological ROS  negative psych ROS   GI/Hepatic negative GI ROS, Neg liver ROS,,,  Endo/Other    Class 3 obesity (BMI 40)  Renal/GU negative Renal ROS  negative genitourinary   Musculoskeletal negative musculoskeletal ROS (+)    Abdominal  (+) + obese  Peds negative pediatric ROS (+)  Hematology negative hematology ROS (+)   Anesthesia Other Findings Mounjaro  LD: hasn't started yet  Reproductive/Obstetrics negative OB ROS                             Anesthesia Physical Anesthesia Plan  ASA: 3  Anesthesia Plan: General   Post-op Pain Management: Tylenol  PO (pre-op)* and Toradol  IV (intra-op)*   Induction: Intravenous  PONV Risk Score and Plan: 2 and Ondansetron , Dexamethasone , Midazolam  and Treatment may vary due to age or medical condition  Airway Management Planned: LMA  Additional Equipment: None  Intra-op Plan:   Post-operative Plan: Extubation in OR  Informed Consent: I have reviewed the patients History and Physical, chart, labs and discussed the procedure including the risks, benefits and alternatives for the proposed anesthesia with the patient or authorized representative who has indicated his/her understanding and acceptance.     Dental  advisory given  Plan Discussed with: CRNA  Anesthesia Plan Comments: (Last airway note: Induction Type: IV induction Ventilation: Mask ventilation without difficulty Tube type: Oral Tube size: 8.5 mm )       Anesthesia Quick Evaluation

## 2023-09-02 NOTE — Discharge Instructions (Addendum)
 Discharge Instructions    Attending Surgeon: Wilhelmenia Harada, MD Office Phone Number: [phone]   Diagnosis and Procedures:    Surgeries Performed: Left hamstring repair  Discharge Plan:    Diet: Resume usual diet. Begin with light or bland foods.  Drink plenty of fluids.  Activity:  Weightbearing as tolerated left leg.. You are advised to go home directly from the hospital or surgical center. Restrict your activities.  GENERAL INSTRUCTIONS: 1.  Please apply ice to your wound to help with swelling and inflammation. This will improve your comfort and your overall recovery following surgery.     2. Please call Dr. Verline Glow office at [phone] with questions Monday-Friday during business hours. If no one answers, please leave a message and someone should get back to the patient within 24 hours. For emergencies please call 911 or proceed to the emergency room.   3. Patient to notify surgical team if experiences any of the following: Bowel/Bladder dysfunction, uncontrolled pain, nerve/muscle weakness, incision with increased drainage or redness, nausea/vomiting and Fever greater than 101.0 F.  Be alert for signs of infection including redness, streaking, odor, fever or chills. Be alert for excessive pain or bleeding and notify your surgeon immediately.  WOUND INSTRUCTIONS:   Leave your dressing, cast, or splint in place until your post operative visit.  Keep it clean and dry.  Always keep the incision clean and dry until the staples/sutures are removed. If there is no drainage from the incision you should keep it open to air. If there is drainage from the incision you must keep it covered at all times until the drainage stops  Do not soak in a bath tub, hot tub, pool, lake or other body of water until 21 days after your surgery and your incision is completely dry and healed.  If you have removable sutures (or staples) they must be removed 10-14 days (unless otherwise  instructed) from the day of your surgery.     1)  Elevate the extremity as much as possible.  2)  Keep the dressing clean and dry.  3)  Please call us  if the dressing becomes wet or dirty.  4)  If you are experiencing worsening pain or worsening swelling, please call.     MEDICATIONS: Resume all previous home medications at the previous prescribed dose and frequency unless otherwise noted Start taking the  pain medications on an as-needed basis as prescribed  Please taper down pain medication over the next week following surgery.  Ideally you should not require a refill of any narcotic pain medication.  Take pain medication with food to minimize nausea. In addition to the prescribed pain medication, you may take over-the-counter pain relievers such as Tylenol .  Do NOT take additional tylenol  if your pain medication already has tylenol  in it.  Aspirin  325mg  daily per instructions on bottle. Narcotic policy: Per [facility] clinic policy, our goal is ensure optimal postoperative pain control with a multimodal pain management strategy. For all [facility] patients, our goal is to wean post-operative narcotic medications by 6 weeks post-operatively, and many times sooner. If this is not possible due to utilization of pain medication prior to surgery, your [facility] doctor will support your acute post-operative pain control for the first 6 weeks postoperatively, with a plan to transition you back to your primary pain team following that. Max Spain will work to ensure a Therapist, occupational.      No tylenol  until 7 p.m. No ibuprofen  until 8:30 p.m.  FOLLOWUP  INSTRUCTIONS: 1. Follow up at the Physical Therapy Clinic 3-4 days following surgery. This appointment should be scheduled unless other arrangements have been made.The Physical Therapy scheduling number is [phone] if an appointment has not already been arranged.  2. Contact Dr. Verline Glow office during office hours at [phone] or the practice  after hours line at [phone] for non-emergencies. For medical emergencies call 911.   Discharge Location: Home   Post Anesthesia Home Care Instructions  Activity: Get plenty of rest for the remainder of the day. A responsible individual must stay with you for 24 hours following the procedure.  For the next 24 hours, DO NOT: -Drive a car -Advertising copywriter -Drink alcoholic beverages -Take any medication unless instructed by your physician -Make any legal decisions or sign important papers.  Meals: Start with liquid foods such as gelatin or soup. Progress to regular foods as tolerated. Avoid greasy, spicy, heavy foods. If nausea and/or vomiting occur, drink only clear liquids until the nausea and/or vomiting subsides. Call your physician if vomiting continues.  Special Instructions/Symptoms: Your throat may feel dry or sore from the anesthesia or the breathing tube placed in your throat during surgery. If this causes discomfort, gargle with warm salt water. The discomfort should disappear within 24 hours.  If you had a scopolamine patch placed behind your ear for the management of post- operative nausea and/or vomiting:  1. The medication in the patch is effective for 72 hours, after which it should be removed.  Wrap patch in a tissue and discard in the trash. Wash hands thoroughly with soap and water. 2. You may remove the patch earlier than 72 hours if you experience unpleasant side effects which may include dry mouth, dizziness or visual disturbances. 3. Avoid touching the patch. Wash your hands with soap and water after contact with the patch.

## 2023-09-02 NOTE — Brief Op Note (Signed)
   Brief Op Note  Date of Surgery: 09/02/2023  Preoperative Diagnosis: LEFT HAMSTRING TEAR  Postoperative Diagnosis: same  Procedure: Procedure(s): REPAIR, MUSCLE, QUADRICEPS OR HAMSTRING  Implants: Implant Name Type Inv. Item Serial No. Manufacturer Lot No. LRB No. Used Action  ANCHOR SUT 1.4 FLEX - [id number] Anchor ANCHOR SUT 1.4 FLEX  STRYKER ENDOSCOPY [id number] Left 2 Implanted    Surgeons: Surgeon(s): Wilhelmenia Harada, MD  Anesthesia: General    Estimated Blood Loss: See anesthesia record  Complications: None  Condition to PACU: Stable  Carmina Chris, MD 09/02/2023 2:51 PM

## 2023-09-02 NOTE — Transfer of Care (Signed)
 Immediate Anesthesia Transfer of Care Note  Patient: Cody Cruz  Procedure(s) Performed: REPAIR, MUSCLE, QUADRICEPS OR HAMSTRING (Left)  Patient Location: PACU  Anesthesia Type:General  Level of Consciousness: awake and patient cooperative  Airway & Oxygen  Therapy: Patient Spontanous Breathing and Patient connected to face mask oxygen   Post-op Assessment: Report given to RN and Post -op Vital signs reviewed and stable  Post vital signs: Reviewed and stable  Last Vitals:  Vitals Value Taken Time  BP 131/73 09/02/23 14:45  Temp    Pulse 63 09/02/23 14:47  Resp 14 09/02/23 14:47  SpO2 97 % 09/02/23 14:47  Vitals shown include unfiled device data.  Last Pain:  Vitals:   09/02/23 1145  PainSc: 0-No pain      Patients Stated Pain Goal: 5 (09/02/23 1145)  Complications: No notable events documented.

## 2023-09-02 NOTE — H&P (Signed)
 Expand All Collapse All       Chief Complaint: Buttocks pain        History of Present Illness:      Cody Cruz is a 48 y.o. male presents with left posterior hip pain after an injury in 2019 where he was sliding into third base and hyper flexed at the left hip with the knee in full extension.  Since this time he was found to have a hamstring rupture on the left side with bruising and pain.  He is experiencing pain about the ischial tuberosity.  He has had 3 bouts of physical therapy without any significant improvement.  He has not had any injections in this area.  Is very active enjoys golfing.  He is referred to me today from my partner Dr. Lucienne Ryder for further discussion of the proximal hamstring       PMH/PSH/Family History/Social History/Meds/Allergies:         Past Medical History:  Diagnosis Date   Chest pain 07/2019    ACS ruled out. Stress test and echo normal.  BP was out of control at the time of his eval/hosp.   Esophageal abnormality 08/2019    mildly dilated distal esophagus + food debris in esoph noted on coronary CT calcium  scoring imaging--refer to GI for eval for possible esoph dysmotility--pt declined this as of 08/2019.   Essential hypertension     Gout      toes   Hyperlipidemia      statins->gout?  ok on pravastatin  2023   Insomnia      lunesta  helpful   Mild intermittent asthma      activity induced, typically.  Allergies trigger as a kid.   Obesity, Class II, BMI 35-39.9     OSA on CPAP      [facility]- uses cpap nightly   Plantar fasciitis 01/2019    steroid inj, Dr. Celia Coles   Pulmonary nodules 08/2019    incidentally noted on coronary calcium  scoring imaging-> enlarging nodule on 1 yr f/u imaging->pulm eval->LN bx no malignant cells, bronch washings/lavage with some atypical cells favored to be reactive, no malignant cells.             Past Surgical History:  Procedure Laterality Date   APPENDECTOMY   1989   BRONCHIAL BRUSHINGS   09/27/2020     Procedure: BRONCHIAL BRUSHINGS;  Surgeon: Prudy Brownie, DO;  Location: [facility];  Service: Pulmonary;;   BRONCHIAL NEEDLE ASPIRATION BIOPSY   09/27/2020    Procedure: BRONCHIAL NEEDLE ASPIRATION BIOPSIES;  Surgeon: Prudy Brownie, DO;  Location: [facility];  Service: Pulmonary;;   BRONCHIAL WASHINGS   09/27/2020    Procedure: BRONCHIAL WASHINGS;  Surgeon: Prudy Brownie, DO;  Location: [facility];  Service: Pulmonary;;   CARDIOVASCULAR STRESS TEST   07/31/2019    NO ISCHEMIA   coronary ct   08/2019    calcium  score ZERO.     TONSILLECTOMY AND ADENOIDECTOMY   1979   TRANSTHORACIC ECHOCARDIOGRAM   07/31/2019    normal   VIDEO BRONCHOSCOPY WITH ENDOBRONCHIAL NAVIGATION Right 09/27/2020    Procedure: VIDEO BRONCHOSCOPY WITH ENDOBRONCHIAL NAVIGATION;  Surgeon: Prudy Brownie, DO;  Location: [facility];  Service: Pulmonary;  Laterality: Right;   VIDEO BRONCHOSCOPY WITH ENDOBRONCHIAL ULTRASOUND N/A 09/27/2020    Procedure: VIDEO BRONCHOSCOPY WITH ENDOBRONCHIAL ULTRASOUND;  Surgeon: Prudy Brownie, DO;  Location: [facility];  Service: Pulmonary;  Laterality: N/A;   WISDOM TOOTH EXTRACTION  Social History         Socioeconomic History   Marital status: Married      Spouse name: Not on file   Number of children: Not on file   Years of education: Not on file   Highest education level: Master's degree (e.g., MA, MS, MEng, MEd, MSW, MBA)  Occupational History   Not on file  Tobacco Use   Smoking status: Never   Smokeless tobacco: Never  Vaping Use   Vaping status: Never Used  Substance and Sexual Activity   Alcohol use: Yes      Comment: 10-14   Drug use: No   Sexual activity: Not on file  Other Topics Concern   Not on file  Social History Narrative    Married, two children (one son and one daughter).    Education: [employer].    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    No tobacco.  Alc 3-4 beers per week avg.  No alc/drugs.     Exercise: CV and wt's at a GYM 3-5 days a week when feet are not hurting him.    Social Drivers of Acupuncturist Strain: Low Risk  (08/21/2021)    Overall Financial Resource Strain (CARDIA)     Difficulty of Paying Living Expenses: Not hard at all  Food Insecurity: No Food Insecurity (08/21/2021)    Hunger Vital Sign     Worried About Running Out of Food in the Last Year: Never true     Ran Out of Food in the Last Year: Never true  Transportation Needs: No Transportation Needs (08/21/2021)    PRAPARE - Therapist, art (Medical): No     Lack of Transportation (Non-Medical): No  Physical Activity: Sufficiently Active (08/21/2021)    Exercise Vital Sign     Days of Exercise per Week: 3 days     Minutes of Exercise per Session: 60 min  Stress: No Stress Concern Present (08/21/2021)    [employer] of Occupational Health - Occupational Stress Questionnaire     Feeling of Stress : Only a little  Social Connections: Moderately Isolated (08/21/2021)    Social Connection and Isolation Panel [NHANES]     Frequency of Communication with Friends and Family: Twice a week     Frequency of Social Gatherings with Friends and Family: Once a week     Attends Religious Services: Never     Database administrator or Organizations: No     Attends Engineer, structural: Not on file     Marital Status: Married         Family History  Problem Relation Age of Onset   Colon polyps Mother     Arthritis Mother     Hyperlipidemia Mother     Hypertension Mother     Colon cancer Father 82   Hypertension Father     CAD Maternal Uncle 59 - 74   Colon cancer Paternal Uncle 23   CAD Maternal Grandfather 54 - 55   Esophageal cancer Neg Hx     Rectal cancer Neg Hx     Stomach cancer Neg Hx          Allergies       Allergies  Allergen Reactions   Statins Other (See Comments)      ?gout            Current Outpatient Medications  Medication Sig  Dispense Refill   acetaminophen  (TYLENOL ) 500 MG tablet Take 1 tablet (500 mg total) by mouth every 8 (eight) hours for 10 days. 30 tablet 0   aspirin  EC 325 MG tablet Take 1 tablet (325 mg total) by mouth daily. 14 tablet 0   ibuprofen  (ADVIL ) 800 MG tablet Take 1 tablet (800 mg total) by mouth every 8 (eight) hours for 10 days. Please take with food, please alternate with acetaminophen  30 tablet 0   oxyCODONE  (ROXICODONE ) 5 MG immediate release tablet Take 1 tablet (5 mg total) by mouth every 4 (four) hours as needed for severe pain (pain score 7-10) or breakthrough pain. 10 tablet 0   albuterol  (VENTOLIN  HFA) 108 (90 Base) MCG/ACT inhaler Inhale 2 puffs into the lungs every 6 (six) hours as needed for wheezing or shortness of breath. 8 g 0   aspirin  EC 81 MG EC tablet Take 1 tablet (81 mg total) by mouth daily. 150 tablet 3   atorvastatin  (LIPITOR) 80 MG tablet Take 1 tablet (80 mg total) by mouth daily. 90 tablet 3   cetirizine (ZYRTEC) 10 MG chewable tablet Chew 10 mg by mouth daily.       ezetimibe  (ZETIA ) 10 MG tablet Take 1 tablet (10 mg total) by mouth daily. 90 tablet 3   losartan  (COZAAR ) 50 MG tablet Take 1 tablet (50 mg total) by mouth daily. 90 tablet 3   zolpidem  (AMBIEN ) 10 MG tablet Take 1 tablet (10 mg total) by mouth at bedtime as needed for sleep. 30 tablet 5      No current facility-administered medications for this visit.      Imaging Results (Last 48 hours)  No results found.     Review of Systems:   A ROS was performed including pertinent positives and negatives as documented in the HPI.   Physical Exam :   Constitutional: NAD and appears stated age Neurological: Alert and oriented Psych: Appropriate affect and cooperative There were no vitals taken for this visit.    Comprehensive Musculoskeletal Exam:     Tenderness about the left ischial tuberosity with resisted hamstring curl in particular.  There is evidence of intrasubstance tearing of the muscular belly  of the left hamstring although he does have symptoms that are predominantly isolated to the insertional site of the proximal hamstring.  Manger of distal neurosensory exam is intact.  There is no obvious bruising     Imaging:       MRI (left femur): Insertional tearing of the proximal hamstring without evidence of retraction     I personally reviewed and interpreted the radiographs.     Assessment and Plan:   48 y.o. 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  At this time he has trialed physical therapy multiple times without relief.  He is hoping to stay active and continues to golf.  Given this we did discuss the possibility of an endoscopic proximal hamstring repair.  I did discuss the risks and limitations associated with this.  I did discuss recovery timeframe.  I discussed the tenderness to surgery including shockwave and PRP.  After discussion he is elected proceed with this   -plan for left proximal hamstring endoscopic repair       After a lengthy discussion of treatment options, including risks, benefits, alternatives, complications of surgical and nonsurgical conservative options, the patient elected surgical repair.    The patient  is aware of the material risks  and complications including, but not limited to  injury to adjacent structures, neurovascular injury, infection, numbness, bleeding, implant failure, thermal burns, stiffness, persistent pain, failure to heal, disease transmission from allograft, need for further surgery, dislocation, anesthetic risks, blood clots, risks of death,and others. The probabilities of surgical success and failure discussed with patient given their particular co-morbidities.The time and nature of expected rehabilitation and recovery was discussed.The patient's questions were all answered preoperatively.  No barriers to understanding were noted. I explained the natural history of the disease process and Rx rationale.   I explained to the patient what I considered to be reasonable expectations given their personal situation.  The final treatment plan was arrived at through a shared patient decision making process model.   I personally saw and evaluated the patient, and participated in the management and treatment plan.   Wilhelmenia Harada, MD Attending Physician, Orthopedic Surgery   This document was dictated using Dragon voice recognition software. A reasonable attempt at proof reading has been made to minimize errors.

## 2023-09-03 ENCOUNTER — Other Ambulatory Visit: Payer: Self-pay

## 2023-09-04 ENCOUNTER — Encounter (HOSPITAL_BASED_OUTPATIENT_CLINIC_OR_DEPARTMENT_OTHER): Payer: Self-pay | Admitting: Orthopaedic Surgery

## 2023-09-05 ENCOUNTER — Encounter (HOSPITAL_BASED_OUTPATIENT_CLINIC_OR_DEPARTMENT_OTHER): Payer: Self-pay | Admitting: Physical Therapy

## 2023-09-05 ENCOUNTER — Ambulatory Visit (HOSPITAL_BASED_OUTPATIENT_CLINIC_OR_DEPARTMENT_OTHER): Attending: Orthopaedic Surgery | Admitting: Physical Therapy

## 2023-09-05 ENCOUNTER — Other Ambulatory Visit: Payer: Self-pay

## 2023-09-05 DIAGNOSIS — M6281 Muscle weakness (generalized): Secondary | ICD-10-CM | POA: Insufficient documentation

## 2023-09-05 DIAGNOSIS — M25552 Pain in left hip: Secondary | ICD-10-CM | POA: Diagnosis present

## 2023-09-05 DIAGNOSIS — S76312D Strain of muscle, fascia and tendon of the posterior muscle group at thigh level, left thigh, subsequent encounter: Secondary | ICD-10-CM | POA: Diagnosis not present

## 2023-09-05 DIAGNOSIS — S76312S Strain of muscle, fascia and tendon of the posterior muscle group at thigh level, left thigh, sequela: Secondary | ICD-10-CM | POA: Insufficient documentation

## 2023-09-05 DIAGNOSIS — X509XXD Other and unspecified overexertion or strenuous movements or postures, subsequent encounter: Secondary | ICD-10-CM | POA: Insufficient documentation

## 2023-09-05 NOTE — Therapy (Signed)
 OUTPATIENT PHYSICAL THERAPY EVALUATION   Patient Name: Cody Cruz MRN: [id number] DOB:1975/05/03, 48 y.o., male Today's Date: 09/05/2023  END OF SESSION:  PT End of Session - 09/05/23 1337     Visit Number 1    Number of Visits 18    Date for PT Re-Evaluation 11/16/23    PT Start Time 1337    PT Stop Time 1402    PT Time Calculation (min) 25 min    Activity Tolerance Patient tolerated treatment well    Behavior During Therapy [facility] for tasks assessed/performed          Past Medical History:  Diagnosis Date   Chest pain 07/2019   ACS ruled out. Stress test and echo normal.  BP was out of control at the time of his eval/hosp.   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   Essential hypertension    Gout    toes   Hamstring tear    Hyperlipidemia    statins->gout?  ok on pravastatin  2023   Insomnia    Mild intermittent asthma    activity induced, typically.  Allergies trigger as a kid.   Obesity, Class II, BMI 35-39.9    OSA on CPAP    [facility]- uses cpap nightly   Plantar fasciitis 01/2019   steroid inj, Dr. Celia Coles   Pulmonary nodules 08/2019   incidentally noted on coronary calcium  scoring imaging-> enlarging nodule on 1 yr f/u imaging->pulm eval->LN bx no malignant cells, bronch washings/lavage with some atypical cells favored to be reactive, no malignant cells.  CT 2024 stable.   Past Surgical History:  Procedure Laterality Date   APPENDECTOMY  1989   BRONCHIAL BRUSHINGS  09/27/2020   Procedure: BRONCHIAL BRUSHINGS;  Surgeon: Prudy Brownie, DO;  [facility]: [facility];  Service: Pulmonary;;   BRONCHIAL NEEDLE ASPIRATION BIOPSY  09/27/2020   Procedure: BRONCHIAL NEEDLE ASPIRATION BIOPSIES;  Surgeon: Prudy Brownie, DO;   Location: [facility];  Service: Pulmonary;;   BRONCHIAL WASHINGS  09/27/2020   Procedure: BRONCHIAL WASHINGS;  Surgeon: Prudy Brownie, DO;  Location: [facility];  Service: Pulmonary;;   CARDIOVASCULAR STRESS TEST  07/31/2019   NO ISCHEMIA   COLONOSCOPY W/ POLYPECTOMY     06/2021, multiple adenomas, recall 3 years   coronary ct  08/2019   calcium  score ZERO.     REPAIR QUADRICEPS/HAMSTRING MUSCLES Left 09/02/2023   Procedure: REPAIR, MUSCLE, QUADRICEPS OR HAMSTRING;  Surgeon: Wilhelmenia Harada, MD;  Location: [facility];  Service: Orthopedics;  Laterality: Left;  LEFT PROXIMAL HAMSTRING ENDOSCOPIC REPAIR   TONSILLECTOMY AND ADENOIDECTOMY  1979   TRANSTHORACIC ECHOCARDIOGRAM  07/31/2019   normal   VIDEO BRONCHOSCOPY WITH ENDOBRONCHIAL NAVIGATION Right 09/27/2020   Procedure: VIDEO BRONCHOSCOPY WITH ENDOBRONCHIAL NAVIGATION;  Surgeon: Prudy Brownie, DO;  Location: [facility];  Service: Pulmonary;  Laterality: Right;   VIDEO BRONCHOSCOPY WITH ENDOBRONCHIAL ULTRASOUND N/A 09/27/2020   Procedure: VIDEO BRONCHOSCOPY WITH ENDOBRONCHIAL ULTRASOUND;  Surgeon: Prudy Brownie, DO;  Location: [facility];  Service: Pulmonary;  Laterality: N/A;   WISDOM TOOTH EXTRACTION     Patient Active Problem List   Diagnosis Date Noted   Tear of left hamstring 09/02/2023   Nodule of upper lobe of right lung 09/12/2020   Mediastinal adenopathy  09/12/2020   Atypical chest pain 07/31/2019   Hypertension 07/31/2019   Chest pain 07/30/2019   S/P right knee arthroscopy 03/29/2017   Hamstring strain, left, sequela 02/15/2017   OSA (obstructive sleep apnea) 10/20/2015   Hypersomnia 07/11/2015   Obesity 07/11/2015   Asthma 07/11/2015   Hyperlipidemia 05/16/2015   Cuboid syndrome of right foot 04/21/2014   Nonallopathic lesion of lower extremities 04/21/2014   Pes planus of both feet 10/30/2013   Health maintenance examination 07/07/2013   Plantar fasciitis, bilateral 07/07/2013    Achilles tendon pain 07/07/2013   Onychomycosis 07/07/2013    REFERRING PROVIDER:  Wilhelmenia Harada, MD   REFERRING DIAG:  S76.312S (ICD-10-CM) - Tear of left hamstring, sequela    s/p Lt proximal hamstring repair  Rationale for Evaluation and Treatment: Rehabilitation  THERAPY DIAG:  Tear of left hamstring, sequela  Pain in left hip  Muscle weakness (generalized)  ONSET DATE: 09/02/23 DOS   SUBJECTIVE:                                                                                                                                                                                           SUBJECTIVE STATEMENT: Did 3 rounds of PT prior to surgery which made it worse. Playing softball caused injury.   PERTINENT HISTORY:  Chronic tear prior to repair  PAIN:  Are you having pain? Yes: NPRS scale: 2-3/10 Pain location: incision site Pain description: sore Aggravating factors: sitting, stretching Relieving factors: ice  PRECAUTIONS:  Avoid hip flexion past 90  RED FLAGS: None   WEIGHT BEARING RESTRICTIONS:  WBAT  FALLS:  Has patient fallen in last 6 months? 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  PATIENT SURVEYS:  HOOS jr. 09/05/23: 8/24 (goal 0/24)  LEFS  Extreme difficulty/unable (0), Quite a bit of difficulty (1), Moderate difficulty (2), Little difficulty (3), No difficulty (4) Survey date:  09/05/23  Any of your usual work, housework or school activities 3  2. Usual hobbies, recreational or sporting activities 1  3. Getting into/out of the bath 3  4. Walking between rooms 4  5. Putting on socks/shoes 3  6. Squatting  2  7. Lifting an object, like a bag of groceries from the floor 2  8. Performing light activities around your  home 2  9. Performing heavy activities around your home 2  10. Getting into/out of a car 3  11.  Walking 2 blocks 3  12. Walking 1 mile 3  13. Going up/down 10 stairs (1 flight) 3  14. Standing for 1 hour 2  15.  sitting for 1 hour 2  16. Running on even ground 1  17. Running on uneven ground 1  18. Making sharp turns while running fast 1  19. Hopping  1  20. Rolling over in bed 3  Score total:  45     COGNITIVE STATUS: Within functional limits for tasks assessed   SENSATION: Eval: numbness at surgical site  EDEMA:  No  POSTURE:  No Significant postural limitations   GAIT: Eval: no AD or brace, mild antalgic gait                                                                                                                                 TREATMENT DATE:   Eval: see HEP   PATIENT EDUCATION:  Education details: Anatomy of condition, POC, HEP, exercise form/rationale Person educated: Patient Education method: Explanation, Demonstration, Tactile cues, Verbal cues, and Handouts Education comprehension: verbalized understanding, returned demonstration, verbal cues required, tactile cues required, and needs further education  HOME EXERCISE PROGRAM: Access Code: URL: [email or web address] Date: 09/05/2023 Prepared by: Keven Pel  Exercises - Quadruped Transversus Abdominis Bracing  - 2 x daily - 7 x weekly - 5 sets - 3 breaths hold - Quadruped Hip Extension Kicks  - 2 x daily - 7 x weekly - 2 sets - 10 reps - Sidelying Hip Abduction  - 2 x daily - 7 x weekly - 2 sets - 10 reps - Sidelying Hip Circles  - 2 x daily - 7 x weekly - 2 sets - 10 reps - Supine Straight Leg Raise with ASIS Palpation  - 2 x daily - 7 x weekly - 2 sets - 10 reps - Heel Raise  - 2-3 x daily - 7 x weekly - 2 sets - 10 reps   ASSESSMENT:  CLINICAL IMPRESSION: Patient is a 48 y.o. M who was seen today for physical therapy evaluation and treatment for s/p Lt  proximal HS tear. Pt was doing all of his usual activities since the hamstring tear and is very active. Pt will benefit from skilled PT to progress appropriately through post op protocol and achieve long term functional goals.      REHAB POTENTIAL: Good  CLINICAL DECISION MAKING: Stable/uncomplicated  EVALUATION COMPLEXITY: Low   GOALS: Goals reviewed with patient? Yes  SHORT TERM GOALS: Target date: 7/14  Full passive motion of hip without insertion pain Baseline: Goal status: INITIAL  2.  Normalized gait pattern Baseline:  Goal status: INITIAL   LONG TERM GOALS: Target date: POC date  Stretching and isometric activation of hamstrings without insertion pain Baseline:  Goal status: INITIAL  2.  LEFS and HOOS jr to meet ceiling Baseline:  Goal status: INITIAL  3.  Progressing to gym program with understanding of  weight increases to protect repair  Baseline:  Goal status: INITIAL  4.  Demo single leg balance static and dynamic without compensation.  Baseline:  Goal status: INITIAL    PLAN:  PT FREQUENCY: 1-2x/week  PT DURATION: POC date  PLANNED INTERVENTIONS: [id number]- PT Re-evaluation, 97750- Physical Performance Testing, 97110-Therapeutic exercises, 97530- Therapeutic activity, [id number]- Neuromuscular re-education, 97535- Self Care, [id number]- Manual therapy, [phone]- Gait training, [phone]- Aquatic Therapy, [phone] (1-2 muscles), [phone] (3+ muscles)- Dry Needling, Patient/Family education, Balance training, Stair training, Taping, Joint mobilization, Spinal mobilization, Cryotherapy, and Moist heat.  PLAN FOR NEXT SESSION: [place]  [facility] protocol   Keven Pel, PT,DPT 09/05/2023, 2:25 PM

## 2023-09-11 ENCOUNTER — Encounter (HOSPITAL_BASED_OUTPATIENT_CLINIC_OR_DEPARTMENT_OTHER): Payer: Self-pay | Admitting: Rehabilitative and Restorative Service Providers"

## 2023-09-11 ENCOUNTER — Ambulatory Visit (HOSPITAL_BASED_OUTPATIENT_CLINIC_OR_DEPARTMENT_OTHER): Admitting: Rehabilitative and Restorative Service Providers"

## 2023-09-11 DIAGNOSIS — S76312S Strain of muscle, fascia and tendon of the posterior muscle group at thigh level, left thigh, sequela: Secondary | ICD-10-CM | POA: Diagnosis not present

## 2023-09-11 DIAGNOSIS — M25552 Pain in left hip: Secondary | ICD-10-CM

## 2023-09-11 DIAGNOSIS — M6281 Muscle weakness (generalized): Secondary | ICD-10-CM

## 2023-09-11 DIAGNOSIS — S76312D Strain of muscle, fascia and tendon of the posterior muscle group at thigh level, left thigh, subsequent encounter: Secondary | ICD-10-CM | POA: Diagnosis not present

## 2023-09-11 NOTE — Therapy (Signed)
 OUTPATIENT PHYSICAL THERAPY TREATMENT   Patient Name: Cody Cruz MRN: [id number] DOB:[birth year], 48 y.o., male Today's Date: 09/11/2023  END OF SESSION:  PT End of Session - 09/11/23 0753     Visit Number 2    Number of Visits 18    Date for PT Re-Evaluation 11/16/23    PT Start Time 0758    PT Stop Time 0841    PT Time Calculation (min) 43 min    Activity Tolerance Patient tolerated treatment well;No increased pain    Behavior During Therapy [facility] for tasks assessed/performed           Past Medical History:  Diagnosis Date   Chest pain 07/2019   ACS ruled out. Stress test and echo normal.  BP was out of control at the time of his eval/hosp.   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   Essential hypertension    Gout    toes   Hamstring tear    Hyperlipidemia    statins->gout?  ok on pravastatin  2023   Insomnia    Mild intermittent asthma    activity induced, typically.  Allergies trigger as a kid.   Obesity, Class II, BMI 35-39.9    OSA on CPAP    [facility]- uses cpap nightly   Plantar fasciitis 01/2019   steroid inj, Dr. Magdalen   Pulmonary nodules 08/2019   incidentally noted on coronary calcium  scoring imaging-> enlarging nodule on 1 yr f/u imaging->pulm eval->LN bx no malignant cells, bronch washings/lavage with some atypical cells favored to be reactive, no malignant cells.  CT 2024 stable.   Past Surgical History:  Procedure Laterality Date   APPENDECTOMY  1989   BRONCHIAL BRUSHINGS  09/27/2020   Procedure: BRONCHIAL BRUSHINGS;  Surgeon: Brenna Adine CROME, DO;  Location: [facility];  Service: Pulmonary;;   BRONCHIAL NEEDLE ASPIRATION BIOPSY  09/27/2020   Procedure: BRONCHIAL NEEDLE ASPIRATION BIOPSIES;  Surgeon: Brenna Adine CROME, DO;  Location: [facility];  Service: Pulmonary;;   BRONCHIAL WASHINGS  09/27/2020   Procedure: BRONCHIAL WASHINGS;  Surgeon: Brenna Adine CROME, DO;  Location: [facility];  Service: Pulmonary;;   CARDIOVASCULAR STRESS TEST  07/31/2019   NO ISCHEMIA   COLONOSCOPY W/ POLYPECTOMY     06/2021, multiple adenomas, recall 3 years   coronary ct  08/2019   calcium  score ZERO.     REPAIR QUADRICEPS/HAMSTRING MUSCLES Left 09/02/2023   Procedure: REPAIR, MUSCLE, QUADRICEPS OR HAMSTRING;  Surgeon: Genelle Standing, MD;  Location: [facility];  Service: Orthopedics;  Laterality: Left;  LEFT PROXIMAL HAMSTRING ENDOSCOPIC REPAIR   TONSILLECTOMY AND ADENOIDECTOMY  1979   TRANSTHORACIC ECHOCARDIOGRAM  07/31/2019   normal   VIDEO BRONCHOSCOPY WITH ENDOBRONCHIAL NAVIGATION Right 09/27/2020   Procedure: VIDEO BRONCHOSCOPY WITH ENDOBRONCHIAL NAVIGATION;  Surgeon: Brenna Adine CROME, DO;  Location: [facility];  Service: Pulmonary;  Laterality: Right;   VIDEO BRONCHOSCOPY WITH ENDOBRONCHIAL ULTRASOUND N/A 09/27/2020   Procedure: VIDEO BRONCHOSCOPY WITH ENDOBRONCHIAL ULTRASOUND;  Surgeon: Brenna Adine CROME, DO;  Location: [facility];  Service: Pulmonary;  Laterality: N/A;   WISDOM TOOTH EXTRACTION     Patient Active Problem List   Diagnosis Date Noted   Tear of left hamstring 09/02/2023   Nodule of upper lobe of right lung 09/12/2020  Mediastinal adenopathy 09/12/2020   Atypical chest pain 07/31/2019   Hypertension 07/31/2019   Chest pain 07/30/2019   S/P right knee arthroscopy 03/29/2017   Hamstring strain, left, sequela 02/15/2017   OSA (obstructive sleep apnea) 10/20/2015   Hypersomnia 07/11/2015   Obesity 07/11/2015   Asthma 07/11/2015   Hyperlipidemia 05/16/2015   Cuboid syndrome of right foot 04/21/2014   Nonallopathic lesion of lower extremities 04/21/2014   Pes planus of both feet 10/30/2013   Health maintenance examination 07/07/2013   Plantar fasciitis, bilateral  07/07/2013   Achilles tendon pain 07/07/2013   Onychomycosis 07/07/2013    REFERRING PROVIDER:  Genelle Standing, MD   REFERRING DIAG:  S76.312S (ICD-10-CM) - Tear of left hamstring, sequela    s/p Lt proximal hamstring repair  Rationale for Evaluation and Treatment: Rehabilitation  THERAPY DIAG:  Tear of left hamstring, sequela  Pain in left hip  Muscle weakness (generalized)  ONSET DATE: 09/02/23 DOS   SUBJECTIVE:                                                                                                                                                                                           SUBJECTIVE STATEMENT: Pain is about a 2/10; feel it going up the stairs; just like a slight pinch. I have been walking and when I go up an incline, I feel a little pinch. Otherwise, no pain.  PERTINENT HISTORY:  Chronic tear prior to repair  PAIN:  Are you having pain? Yes: NPRS scale: 2/10 Pain location: incision site Pain description: sore Aggravating factors: sitting, stretching Relieving factors: ice  PRECAUTIONS:  Avoid hip flexion past 90  RED FLAGS: None   WEIGHT BEARING RESTRICTIONS:  WBAT  FALLS:  Has patient fallen in last 6 months? 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  PATIENT SURVEYS:  HOOS jr. 09/05/23: 8/24 (goal 0/24)  LEFS  Extreme difficulty/unable (0), Quite a bit of difficulty (1), Moderate difficulty (2), Little difficulty (3), No difficulty (4) Survey date:  09/05/23  Any of your usual work, housework or school activities 3  2. Usual hobbies, recreational or sporting activities 1  3. Getting into/out of the bath 3  4. Walking between rooms 4  5. Putting on socks/shoes 3  6. Squatting  2  7. Lifting an  object, like a bag of groceries from the floor 2  8. Performing light activities around your home  2  9. Performing heavy activities around your home 2  10. Getting into/out of a car 3  11. Walking 2 blocks 3  12. Walking 1 mile 3  13. Going up/down 10 stairs (1 flight) 3  14. Standing for 1 hour 2  15.  sitting for 1 hour 2  16. Running on even ground 1  17. Running on uneven ground 1  18. Making sharp turns while running fast 1  19. Hopping  1  20. Rolling over in bed 3  Score total:  45     COGNITIVE STATUS: Within functional limits for tasks assessed   SENSATION: Eval: numbness at surgical site  EDEMA:  No  POSTURE:  No Significant postural limitations   GAIT: Eval: no AD or brace, mild antalgic gait                                                                                                                                 TREATMENT DATE:   [facility] Adult PT Treatment:                                                DATE: 09/11/23 Therapeutic Exercise: Review HEP R sidelying L hip abduction with knee flexed x 20 L SLR hip circles CW/CCW x 20 each Ball squeeze x 20 Prone L hip extension x 20 with glute set Prone alternating hip extension limited ROM x 20 Prone glute set bil x 20 Pelvic tilt x 12 Tilt with ball roll to knees x 20 Tilt with oblique crunch with ball x 20 Tilt with oblique reach supine x 20 Tilt with march x 20 L Quad set x 20 Supine L SLR/hip abduction combo x 15 Standing L hip abduction with knee flexed and extended x 20 with PT palpating for glute med activation Standing bil DF x 20 at counter Sidestepping across floor with PT palpating for glute med activation   Eval: see HEP   PATIENT EDUCATION:  Education details: Anatomy of condition, POC, HEP, exercise form/rationale Person educated: Patient Education method: Explanation, Demonstration, Tactile cues, Verbal cues, and Handouts Education comprehension: verbalized understanding,  returned demonstration, verbal cues required, tactile cues required, and needs further education  HOME EXERCISE PROGRAM: Access Code: URL: [email or web address] Date: 09/05/2023 Prepared by: Harlene Cordon  Exercises - Quadruped Transversus Abdominis Bracing  - 2 x daily - 7 x weekly - 5 sets - 3 breaths hold - Quadruped Hip Extension Kicks  - 2 x daily - 7 x weekly - 2 sets - 10 reps - Sidelying Hip Abduction  - 2 x daily - 7 x weekly - 2 sets - 10 reps - Sidelying Hip Circles  - 2 x daily - 7 x weekly - 2 sets - 10 reps - Supine Straight Leg Raise with  ASIS Palpation  - 2 x daily - 7 x weekly - 2 sets - 10 reps - Heel Raise  - 2-3 x daily - 7 x weekly - 2 sets - 10 reps   ASSESSMENT:  CLINICAL IMPRESSION: Patient is a 48 y.o. M who was seen today for physical therapy treatment 1 week post op for s/p Lt proximal HS tear. Pt was doing all of his usual activities since the hamstring tear and is very active. Pt was able to perform all exercises today in clinic without pain. Pt had no pain at tx end. Pt noted to have increased distal Hamstring dominance with prone hip extension/quadruped therex; he stated that MD said there was no problem in that area and was aware. Pt will benefit from skilled PT to progress appropriately through post op protocol and achieve long term functional goals.      REHAB POTENTIAL: Good  CLINICAL DECISION MAKING: Stable/uncomplicated  EVALUATION COMPLEXITY: Low   GOALS: Goals reviewed with patient? Yes  SHORT TERM GOALS: Target date: 7/14  Full passive motion of hip without insertion pain Baseline: Goal status: INITIAL  2.  Normalized gait pattern Baseline:  Goal status: INITIAL   LONG TERM GOALS: Target date: POC date  Stretching and isometric activation of hamstrings without insertion pain Baseline:  Goal status: INITIAL  2.  LEFS and HOOS jr to meet ceiling Baseline:  Goal status: INITIAL  3.  Progressing to  gym program with understanding of weight increases to protect repair  Baseline:  Goal status: INITIAL  4.  Demo single leg balance static and dynamic without compensation.  Baseline:  Goal status: INITIAL    PLAN:  PT FREQUENCY: 1-2x/week  PT DURATION: POC date  PLANNED INTERVENTIONS: 97164- PT Re-evaluation, 97750- Physical Performance Testing, 97110-Therapeutic exercises, 97530- Therapeutic activity, [id number]- Neuromuscular re-education, 97535- Self Care, [id number]- Manual therapy, [phone]- Gait training, [phone]- Aquatic Therapy, [phone] (1-2 muscles), 20561 (3+ muscles)- Dry Needling, Patient/Family education, Balance training, Stair training, Taping, Joint mobilization, Spinal mobilization, Cryotherapy, and Moist heat.  PLAN FOR NEXT SESSION: [place]  [facility] protocol   Alger Ada, PT,DPT 09/11/2023, 8:45 AM

## 2023-09-16 ENCOUNTER — Ambulatory Visit (INDEPENDENT_AMBULATORY_CARE_PROVIDER_SITE_OTHER): Admitting: Orthopaedic Surgery

## 2023-09-16 DIAGNOSIS — S76312S Strain of muscle, fascia and tendon of the posterior muscle group at thigh level, left thigh, sequela: Secondary | ICD-10-CM

## 2023-09-16 NOTE — Progress Notes (Signed)
 Post Operative Evaluation    Procedure/Date of Surgery: Left hamstring endoscopic repair 6/16  Interval History:    2 weeks status post left proximal hamstring endoscopic repair overall doing extremely well.  This time he is walking fully without any pain.  Occasionally has a pinch by the posterior buttocks but this is mild   PMH/PSH/Family History/Social History/Meds/Allergies:    Past Medical History:  Diagnosis Date   Chest pain 07/2019   ACS ruled out. Stress test and echo normal.  BP was out of control at the time of his eval/hosp.   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   Essential hypertension    Gout    toes   Hamstring tear    Hyperlipidemia    statins->gout?  ok on pravastatin  2023   Insomnia    Mild intermittent asthma    activity induced, typically.  Allergies trigger as a kid.   Obesity, Class II, BMI 35-39.9    OSA on CPAP    [facility]- uses cpap nightly   Plantar fasciitis 01/2019   steroid inj, Dr. Magdalen   Pulmonary nodules 08/2019   incidentally noted on coronary calcium  scoring imaging-> enlarging nodule on 1 yr f/u imaging->pulm eval->LN bx no malignant cells, bronch washings/lavage with some atypical cells favored to be reactive, no malignant cells.  CT 2024 stable.   Past Surgical History:  Procedure Laterality Date   APPENDECTOMY  1989   BRONCHIAL BRUSHINGS  09/27/2020   Procedure: BRONCHIAL BRUSHINGS;  Surgeon: Brenna Adine CROME, DO;  [facility]: [facility];  Service: Pulmonary;;   BRONCHIAL NEEDLE ASPIRATION BIOPSY  09/27/2020   Procedure: BRONCHIAL NEEDLE ASPIRATION BIOPSIES;  Surgeon: Brenna Adine CROME, DO;  [facility];  Service: Pulmonary;;   BRONCHIAL WASHINGS   09/27/2020   Procedure: BRONCHIAL WASHINGS;  Surgeon: Brenna Adine CROME, DO;  [facility]: [facility];  Service: Pulmonary;;   CARDIOVASCULAR STRESS TEST  07/31/2019   NO ISCHEMIA   COLONOSCOPY W/ POLYPECTOMY     06/2021, multiple adenomas, recall 3 years   coronary ct  08/2019   calcium  score ZERO.     REPAIR QUADRICEPS/HAMSTRING MUSCLES Left 09/02/2023   Procedure: REPAIR, MUSCLE, QUADRICEPS OR HAMSTRING;  Surgeon: Genelle Standing, MD;  Location: [facility];  Service: Orthopedics;  Laterality: Left;  LEFT PROXIMAL HAMSTRING ENDOSCOPIC REPAIR   TONSILLECTOMY AND ADENOIDECTOMY  1979   TRANSTHORACIC ECHOCARDIOGRAM  07/31/2019   normal   VIDEO BRONCHOSCOPY WITH ENDOBRONCHIAL NAVIGATION Right 09/27/2020   Procedure: VIDEO BRONCHOSCOPY WITH ENDOBRONCHIAL NAVIGATION;  Surgeon: Brenna Adine CROME, DO;  [facility];  Service: Pulmonary;  Laterality: Right;   VIDEO BRONCHOSCOPY WITH ENDOBRONCHIAL ULTRASOUND N/A 09/27/2020   Procedure: VIDEO BRONCHOSCOPY WITH ENDOBRONCHIAL ULTRASOUND;  Surgeon: Brenna Adine CROME, DO;  Location: [facility];  Service: Pulmonary;  Laterality: N/A;   WISDOM TOOTH EXTRACTION     Social History   Socioeconomic History   Marital status: Married    Spouse name: Not on file   Number of children: Not on file   Years of education: Not on file   Highest education level:  Master's degree (e.g., MA, MS, MEng, MEd, MSW, MBA)  Occupational History   Not on file  Tobacco Use   Smoking status: Never   Smokeless tobacco: Never  Vaping Use   Vaping status: Never Used  Substance and Sexual Activity   Alcohol use: Yes    Comment: 10-14   Drug use: No   Sexual activity: Not on file  Other Topics Concern   Not on file  Social History Narrative   Married, two children (one son and one daughter).   Education: [employer].   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   No tobacco.  Alc 3-4 beers per week avg.  No alc/drugs.    Exercise: CV and wt's at a GYM 3-5 days a week when feet are not hurting him.   Social Drivers of Corporate investment banker Strain: Low Risk  (07/27/2023)   Overall Financial Resource Strain (CARDIA)    Difficulty of Paying Living Expenses: Not very hard  Food Insecurity: No Food Insecurity (07/27/2023)   Hunger Vital Sign    Worried About Running Out of Food in the Last Year: Never true    Ran Out of Food in the Last Year: Never true  Transportation Needs: No Transportation Needs (07/27/2023)   PRAPARE - Administrator, Civil Service (Medical): No    Lack of Transportation (Non-Medical): No  Physical Activity: Insufficiently Active (07/27/2023)   Exercise Vital Sign    Days of Exercise per Week: 2 days    Minutes of Exercise per Session: 30 min  Stress: Stress Concern Present (07/27/2023)   [employer] of Occupational Health - Occupational Stress Questionnaire    Feeling of Stress : To some extent  Social Connections: Moderately Isolated (07/27/2023)   Social Connection and Isolation Panel    Frequency of Communication with Friends and Family: Twice a week    Frequency of Social Gatherings with Friends and Family: Once a week    Attends Religious Services: Never    Database administrator or Organizations: No    Attends Engineer, structural: Not on file    Marital Status: Married   Family History  Problem Relation Age of Onset   Colon polyps Mother    Arthritis Mother    Hyperlipidemia Mother    Hypertension Mother    Colon cancer Father 79   Hypertension Father    CAD Maternal Uncle 62 - 17   Colon cancer Paternal Uncle 64   CAD Maternal Grandfather 89 - 55   Esophageal cancer Neg Hx    Rectal cancer Neg Hx    Stomach cancer Neg Hx    Allergies  Allergen Reactions   Statins Other (See Comments)    ?gout   Current Outpatient Medications  Medication Sig Dispense Refill   albuterol  (VENTOLIN  HFA) 108 (90 Base) MCG/ACT inhaler Inhale 2 puffs  into the lungs every 6 (six) hours as needed for wheezing or shortness of breath. 8 g 0   aspirin  EC 325 MG tablet Take 1 tablet (325 mg total) by mouth daily. (Patient not taking: Reported on 07/31/2023) 14 tablet 0   atorvastatin  (LIPITOR) 80 MG tablet Take 1 tablet (80 mg total) by mouth daily. 90 tablet 2   cetirizine (ZYRTEC) 10 MG chewable tablet Chew 10 mg by mouth daily.     ezetimibe  (ZETIA ) 10 MG tablet Take 1 tablet (10 mg total) by mouth daily. 90 tablet 2   losartan  (COZAAR ) 50 MG tablet Take  1 tablet (50 mg total) by mouth daily. 90 tablet 3   oxyCODONE  (ROXICODONE ) 5 MG immediate release tablet Take 1 tablet (5 mg total) by mouth every 4 (four) hours as needed for severe pain (pain score 7-10) or breakthrough pain. (Patient not taking: Reported on 07/31/2023) 10 tablet 0   tirzepatide  (MOUNJARO ) 2.5 MG/0.5ML Pen Inject 2.5 mg into the skin once a week. 2 mL 0   tirzepatide  (ZEPBOUND ) 2.5 MG/0.5ML Pen Inject 2.5 mg into the skin once a week. 2 mL 0   zolpidem  (AMBIEN ) 10 MG tablet TAKE 1 TABLET BY MOUTH AT BEDTIME AS NEEDED FOR SLEEP. 30 tablet 2   No current facility-administered medications for this visit.   No results found.  Review of Systems:   A ROS was performed including pertinent positives and negatives as documented in the HPI.   Musculoskeletal Exam:    There were no vitals taken for this visit.  Left incisions are well-appearing without erythema or drainage.  He can bend to touch his toes without any pain.  Active forward elevation of the left hip is strong with good abduction strength.  Walks with normal gait  Imaging:      I personally reviewed and interpreted the radiographs.   Assessment:   2 weeks status post left endoscopic hamstring repair overall doing extremely well.  At this time we will continue to be activity as tolerated.  I will plan to see her back in 4 weeks for reassessment  Plan :    - Return to clinic 4 weeks for  reassessment      I personally saw and evaluated the patient, and participated in the management and treatment plan.  Elspeth Parker, MD Attending Physician, Orthopedic Surgery  This document was dictated using Dragon voice recognition software. A reasonable attempt at proof reading has been made to minimize errors.

## 2023-09-18 ENCOUNTER — Ambulatory Visit (HOSPITAL_BASED_OUTPATIENT_CLINIC_OR_DEPARTMENT_OTHER): Attending: Orthopaedic Surgery | Admitting: Physical Therapy

## 2023-09-18 ENCOUNTER — Encounter (HOSPITAL_BASED_OUTPATIENT_CLINIC_OR_DEPARTMENT_OTHER): Payer: Self-pay | Admitting: Physical Therapy

## 2023-09-18 DIAGNOSIS — X58XXXS Exposure to other specified factors, sequela: Secondary | ICD-10-CM | POA: Diagnosis not present

## 2023-09-18 DIAGNOSIS — M6281 Muscle weakness (generalized): Secondary | ICD-10-CM | POA: Diagnosis present

## 2023-09-18 DIAGNOSIS — S76312S Strain of muscle, fascia and tendon of the posterior muscle group at thigh level, left thigh, sequela: Secondary | ICD-10-CM | POA: Diagnosis present

## 2023-09-18 DIAGNOSIS — M25552 Pain in left hip: Secondary | ICD-10-CM | POA: Insufficient documentation

## 2023-09-18 NOTE — Therapy (Signed)
 OUTPATIENT PHYSICAL THERAPY TREATMENT   Patient Name: Cody Cruz MRN: [id number] DOB:1975/06/29, 48 y.o., male Today's Date: 09/18/2023  END OF SESSION:  PT End of Session - 09/18/23 0851     Visit Number 3    Number of Visits 18    Date for PT Re-Evaluation 11/16/23    PT Start Time 0851    PT Stop Time 0930    PT Time Calculation (min) 39 min    Activity Tolerance Patient tolerated treatment well;No increased pain    Behavior During Therapy [facility] for tasks assessed/performed           Past Medical History:  Diagnosis Date   Chest pain 07/2019   ACS ruled out. Stress test and echo normal.  BP was out of control at the time of his eval/hosp.   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   Essential hypertension    Gout    toes   Hamstring tear    Hyperlipidemia    statins->gout?  ok on pravastatin  2023   Insomnia    Mild intermittent asthma    activity induced, typically.  Allergies trigger as a kid.   Obesity, Class II, BMI 35-39.9    OSA on CPAP    [place] [facility]- uses cpap nightly   Plantar fasciitis 01/2019   steroid inj, Dr. Magdalen   Pulmonary nodules 08/2019   incidentally noted on coronary calcium  scoring imaging-> enlarging nodule on 1 yr f/u imaging->pulm eval->LN bx no malignant cells, bronch washings/lavage with some atypical cells favored to be reactive, no malignant cells.  CT 2024 stable.   Past Surgical History:  Procedure Laterality Date   APPENDECTOMY  1989   BRONCHIAL BRUSHINGS  09/27/2020   Procedure: BRONCHIAL BRUSHINGS;  Surgeon: Brenna Adine CROME, DO;  Location: [facility];  Service: Pulmonary;;   BRONCHIAL NEEDLE ASPIRATION BIOPSY  09/27/2020   Procedure: BRONCHIAL NEEDLE ASPIRATION BIOPSIES;  Surgeon: Brenna Adine CROME, DO;  Location: [facility];  Service: Pulmonary;;   BRONCHIAL WASHINGS  09/27/2020   Procedure: BRONCHIAL WASHINGS;  Surgeon: Brenna Adine CROME, DO;  Location: [facility];  Service: Pulmonary;;   CARDIOVASCULAR STRESS TEST  07/31/2019   NO ISCHEMIA   COLONOSCOPY W/ POLYPECTOMY     06/2021, multiple adenomas, recall 3 years   coronary ct  08/2019   calcium  score ZERO.     REPAIR QUADRICEPS/HAMSTRING MUSCLES Left 09/02/2023   Procedure: REPAIR, MUSCLE, QUADRICEPS OR HAMSTRING;  Surgeon: Genelle Standing, MD;  Location: [facility];  Service: Orthopedics;  Laterality: Left;  LEFT PROXIMAL HAMSTRING ENDOSCOPIC REPAIR   TONSILLECTOMY AND ADENOIDECTOMY  1979   TRANSTHORACIC ECHOCARDIOGRAM  07/31/2019   normal   VIDEO BRONCHOSCOPY WITH ENDOBRONCHIAL NAVIGATION Right 09/27/2020   Procedure: VIDEO BRONCHOSCOPY WITH ENDOBRONCHIAL NAVIGATION;  Surgeon: Brenna Adine CROME, DO;  Location: [facility];  Service: Pulmonary;  Laterality: Right;   VIDEO BRONCHOSCOPY WITH ENDOBRONCHIAL ULTRASOUND N/A 09/27/2020   Procedure: VIDEO BRONCHOSCOPY WITH ENDOBRONCHIAL ULTRASOUND;  Surgeon: Brenna Adine CROME, DO;  Location: [facility];  Service: Pulmonary;  Laterality: N/A;   WISDOM TOOTH EXTRACTION     Patient Active Problem List   Diagnosis Date Noted   Tear of left hamstring 09/02/2023   Nodule of upper lobe of right lung 09/12/2020  Mediastinal adenopathy 09/12/2020   Atypical chest pain 07/31/2019   Hypertension 07/31/2019   Chest pain 07/30/2019   S/P right knee arthroscopy 03/29/2017   Hamstring strain, left, sequela 02/15/2017   OSA (obstructive sleep apnea) 10/20/2015   Hypersomnia 07/11/2015   Obesity 07/11/2015   Asthma 07/11/2015   Hyperlipidemia 05/16/2015   Cuboid syndrome of right foot 04/21/2014   Nonallopathic lesion of lower extremities 04/21/2014   Pes planus of both feet 10/30/2013   Health maintenance examination 07/07/2013   Plantar fasciitis, bilateral  07/07/2013   Achilles tendon pain 07/07/2013   Onychomycosis 07/07/2013    REFERRING PROVIDER:  Genelle Standing, MD   REFERRING DIAG:  S76.312S (ICD-10-CM) - Tear of left hamstring, sequela    s/p Lt proximal hamstring repair  Rationale for Evaluation and Treatment: Rehabilitation  THERAPY DIAG:  Tear of left hamstring, sequela  Pain in left hip  Muscle weakness (generalized)  ONSET DATE: 09/02/23 DOS   SUBJECTIVE:                                                                                                                                                                                           SUBJECTIVE STATEMENT: Discomfort from butt to knee. HEP going well.   PERTINENT HISTORY:  Chronic tear prior to repair  PAIN:  Are you having pain? Yes: NPRS scale: 1/10 Pain location: incision site Pain description: discomfort Aggravating factors: sitting, stretching Relieving factors: ice  PRECAUTIONS:  Avoid hip flexion past 90  RED FLAGS: None   WEIGHT BEARING RESTRICTIONS:  WBAT  FALLS:  Has patient fallen in last 6 months? 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  PATIENT SURVEYS:  HOOS jr. 09/05/23: 8/24 (goal 0/24)  LEFS  Extreme difficulty/unable (0), Quite a bit of difficulty (1), Moderate difficulty (2), Little difficulty (3), No difficulty (4) Survey date:  09/05/23  Any of your usual work, housework or school activities 3  2. Usual hobbies, recreational or sporting activities 1  3. Getting into/out of the bath 3  4. Walking between rooms 4  5. Putting on socks/shoes 3  6. Squatting  2  7. Lifting an object, like a bag of groceries from the floor 2  8. Performing light activities around your home 2  9. Performing  heavy activities around your home 2  10. Getting into/out of a car 3  11. Walking 2 blocks 3  12.  Walking 1 mile 3  13. Going up/down 10 stairs (1 flight) 3  14. Standing for 1 hour 2  15.  sitting for 1 hour 2  16. Running on even ground 1  17. Running on uneven ground 1  18. Making sharp turns while running fast 1  19. Hopping  1  20. Rolling over in bed 3  Score total:  45     COGNITIVE STATUS: Within functional limits for tasks assessed   SENSATION: Eval: numbness at surgical site  EDEMA:  No  POSTURE:  No Significant postural limitations   GAIT: Eval: no AD or brace, mild antalgic gait                                                                                                                                 TREATMENT DATE:  09/18/23  Bridge 3 x 10 Gentle hamstring isometrics 30, 45, 60, then 90 knee flexion 5 x 10 second holds each SLR small range 3 x 10 Sidelying hip abduction 2 x 10 SLS 5 x 30 second holds Mini squat 3 x 10 Quadruped hip extension donkey kick 3 x 10 Upright bike 5 minutes Quadruped fire hydrant 3 x 10   [facility] Adult PT Treatment:                                                DATE: 09/11/23 Therapeutic Exercise: Review HEP R sidelying L hip abduction with knee flexed x 20 L SLR hip circles CW/CCW x 20 each Ball squeeze x 20 Prone L hip extension x 20 with glute set Prone alternating hip extension limited ROM x 20 Prone glute set bil x 20 Pelvic tilt x 12 Tilt with ball roll to knees x 20 Tilt with oblique crunch with ball x 20 Tilt with oblique reach supine x 20 Tilt with march x 20 L Quad set x 20 Supine L SLR/hip abduction combo x 15 Standing L hip abduction with knee flexed and extended x 20 with PT palpating for glute med activation Standing bil DF x 20 at counter Sidestepping across floor with PT palpating for glute med activation   Eval: see HEP   PATIENT EDUCATION:  Education details: Anatomy of condition,  POC, HEP, exercise form/rationale Person educated: Patient Education method: Explanation, Demonstration, Tactile cues, Verbal cues, and Handouts Education comprehension: verbalized understanding, returned demonstration, verbal cues required, tactile cues required, and needs further education  HOME EXERCISE PROGRAM: Access Code: URL: [email or web address] Date: 09/05/2023 Prepared by: Harlene Cordon  Exercises - Quadruped Transversus Abdominis Bracing  - 2 x daily - 7 x weekly - 5 sets - 3 breaths hold - Quadruped Hip Extension Kicks  - 2 x daily - 7 x weekly - 2 sets - 10 reps - Sidelying Hip Abduction  -  2 x daily - 7 x weekly - 2 sets - 10 reps - Sidelying Hip Circles  - 2 x daily - 7 x weekly - 2 sets - 10 reps - Supine Straight Leg Raise with ASIS Palpation  - 2 x daily - 7 x weekly - 2 sets - 10 reps - Heel Raise  - 2-3 x daily - 7 x weekly - 2 sets - 10 reps   ASSESSMENT:  CLINICAL IMPRESSION: Patient progressing well into hamstring activation exercises and proximal strengthening. Good single leg stability and mechanics with mini squat. Intermittent cueing provided for mechanics/positioning with good carry over. Patient will continue to benefit from physical therapy in order to improve function and reduce impairment.     REHAB POTENTIAL: Good  CLINICAL DECISION MAKING: Stable/uncomplicated  EVALUATION COMPLEXITY: Low   GOALS: Goals reviewed with patient? Yes  SHORT TERM GOALS: Target date: 7/14  Full passive motion of hip without insertion pain Baseline: Goal status: INITIAL  2.  Normalized gait pattern Baseline:  Goal status: INITIAL   LONG TERM GOALS: Target date: POC date  Stretching and isometric activation of hamstrings without insertion pain Baseline:  Goal status: INITIAL  2.  LEFS and HOOS jr to meet ceiling Baseline:  Goal status: INITIAL  3.  Progressing to gym program with understanding of weight increases to  protect repair  Baseline:  Goal status: INITIAL  4.  Demo single leg balance static and dynamic without compensation.  Baseline:  Goal status: INITIAL    PLAN:  PT FREQUENCY: 1-2x/week  PT DURATION: POC date  PLANNED INTERVENTIONS: 97164- PT Re-evaluation, 97750- Physical Performance Testing, 97110-Therapeutic exercises, 97530- Therapeutic activity, [id number]- Neuromuscular re-education, 97535- Self Care, 02859- Manual therapy, [phone]- Gait training, [phone]- Aquatic Therapy, [phone] (1-2 muscles), 20561 (3+ muscles)- Dry Needling, Patient/Family education, Balance training, Stair training, Taping, Joint mobilization, Spinal mobilization, Cryotherapy, and Moist heat.  PLAN FOR NEXT SESSION: [place]  St protocol   Prentice GORMAN Stains, PT,DPT 09/18/2023, 9:37 AM

## 2023-09-19 ENCOUNTER — Other Ambulatory Visit: Payer: Self-pay

## 2023-09-19 MED ORDER — ATORVASTATIN CALCIUM 80 MG PO TABS
80.0000 mg | ORAL_TABLET | Freq: Every day | ORAL | 1 refills | Status: AC
Start: 2023-09-19 — End: ?

## 2023-09-19 MED ORDER — EZETIMIBE 10 MG PO TABS: 10.0000 mg | ORAL_TABLET | Freq: Every day | ORAL | 1 refills | Status: AC

## 2023-09-23 ENCOUNTER — Encounter (HOSPITAL_BASED_OUTPATIENT_CLINIC_OR_DEPARTMENT_OTHER): Payer: Self-pay

## 2023-09-24 ENCOUNTER — Encounter: Payer: Self-pay | Admitting: Cardiovascular Disease

## 2023-09-25 ENCOUNTER — Ambulatory Visit (HOSPITAL_BASED_OUTPATIENT_CLINIC_OR_DEPARTMENT_OTHER): Admitting: Physical Therapy

## 2023-09-25 ENCOUNTER — Encounter (HOSPITAL_BASED_OUTPATIENT_CLINIC_OR_DEPARTMENT_OTHER): Payer: Self-pay | Admitting: Physical Therapy

## 2023-09-25 DIAGNOSIS — S76312S Strain of muscle, fascia and tendon of the posterior muscle group at thigh level, left thigh, sequela: Secondary | ICD-10-CM

## 2023-09-25 DIAGNOSIS — M25552 Pain in left hip: Secondary | ICD-10-CM

## 2023-09-25 DIAGNOSIS — M6281 Muscle weakness (generalized): Secondary | ICD-10-CM

## 2023-09-25 NOTE — Therapy (Signed)
 OUTPATIENT PHYSICAL THERAPY TREATMENT   Patient Name: DOSSIE SWOR MRN: [id number] DOB:[birth year], 48 y.o., male Today's Date: 09/25/2023  END OF SESSION:  PT End of Session - 09/25/23 0836     Visit Number 4    Number of Visits 18    Date for PT Re-Evaluation 11/16/23    PT Start Time 0840    PT Stop Time 0920    PT Time Calculation (min) 40 min    Activity Tolerance Patient tolerated treatment well;No increased pain    Behavior During Therapy [facility] for tasks assessed/performed           Past Medical History:  Diagnosis Date   Chest pain 07/2019   ACS ruled out. Stress test and echo normal.  BP was out of control at the time of his eval/hosp.   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   Essential hypertension    Gout    toes   Hamstring tear    Hyperlipidemia    statins->gout?  ok on pravastatin  2023   Insomnia    Mild intermittent asthma    activity induced, typically.  Allergies trigger as a kid.   Obesity, Class II, BMI 35-39.9    OSA on CPAP    [place] [facility]- uses cpap nightly   Plantar fasciitis 01/2019   steroid inj, Dr. Magdalen   Pulmonary nodules 08/2019   incidentally noted on coronary calcium  scoring imaging-> enlarging nodule on 1 yr f/u imaging->pulm eval->LN bx no malignant cells, bronch washings/lavage with some atypical cells favored to be reactive, no malignant cells.  CT 2024 stable.   Past Surgical History:  Procedure Laterality Date   APPENDECTOMY  1989   BRONCHIAL BRUSHINGS  09/27/2020   Procedure: BRONCHIAL BRUSHINGS;  Surgeon: Brenna Adine CROME, DO;  Location: [facility];  Service: Pulmonary;;   BRONCHIAL NEEDLE ASPIRATION BIOPSY  09/27/2020   Procedure: BRONCHIAL NEEDLE ASPIRATION BIOPSIES;  Surgeon: Brenna Adine CROME, DO;  Location: [facility];  Service: Pulmonary;;   BRONCHIAL WASHINGS  09/27/2020   Procedure: BRONCHIAL WASHINGS;  Surgeon: Brenna Adine CROME, DO;  Location: [facility];  Service: Pulmonary;;   CARDIOVASCULAR STRESS TEST  07/31/2019   NO ISCHEMIA   COLONOSCOPY W/ POLYPECTOMY     06/2021, multiple adenomas, recall 3 years   coronary ct  08/2019   calcium  score ZERO.     REPAIR QUADRICEPS/HAMSTRING MUSCLES Left 09/02/2023   Procedure: REPAIR, MUSCLE, QUADRICEPS OR HAMSTRING;  Surgeon: Genelle Standing, MD;  Location: [facility];  Service: Orthopedics;  Laterality: Left;  LEFT PROXIMAL HAMSTRING ENDOSCOPIC REPAIR   TONSILLECTOMY AND ADENOIDECTOMY  1979   TRANSTHORACIC ECHOCARDIOGRAM  07/31/2019   normal   VIDEO BRONCHOSCOPY WITH ENDOBRONCHIAL NAVIGATION Right 09/27/2020   Procedure: VIDEO BRONCHOSCOPY WITH ENDOBRONCHIAL NAVIGATION;  Surgeon: Brenna Adine CROME, DO;  Location: [facility];  Service: Pulmonary;  Laterality: Right;   VIDEO BRONCHOSCOPY WITH ENDOBRONCHIAL ULTRASOUND N/A 09/27/2020   Procedure: VIDEO BRONCHOSCOPY WITH ENDOBRONCHIAL ULTRASOUND;  Surgeon: Brenna Adine CROME, DO;  Location: [facility];  Service: Pulmonary;  Laterality: N/A;   WISDOM TOOTH EXTRACTION     Patient Active Problem List   Diagnosis Date Noted   Tear of left hamstring 09/02/2023   Nodule of upper lobe of right lung 09/12/2020  Mediastinal adenopathy 09/12/2020   Atypical chest pain 07/31/2019   Hypertension 07/31/2019   Chest pain 07/30/2019   S/P right knee arthroscopy 03/29/2017   Hamstring strain, left, sequela 02/15/2017   OSA (obstructive sleep apnea) 10/20/2015   Hypersomnia 07/11/2015   Obesity 07/11/2015   Asthma 07/11/2015   Hyperlipidemia 05/16/2015   Cuboid syndrome of right foot 04/21/2014   Nonallopathic lesion of lower extremities 04/21/2014   Pes planus of both feet 10/30/2013   Health maintenance examination 07/07/2013   Plantar fasciitis, bilateral  07/07/2013   Achilles tendon pain 07/07/2013   Onychomycosis 07/07/2013    REFERRING PROVIDER:  Genelle Standing, MD   REFERRING DIAG:  S76.312S (ICD-10-CM) - Tear of left hamstring, sequela    s/p Lt proximal hamstring repair  Rationale for Evaluation and Treatment: Rehabilitation  THERAPY DIAG:  Tear of left hamstring, sequela  Pain in left hip  Muscle weakness (generalized)  ONSET DATE: 09/02/23 DOS   SUBJECTIVE:                                                                                                                                                                                           SUBJECTIVE STATEMENT: Patient states only real discomfort is lower portion of hamstring. Felt alright after last session.   PERTINENT HISTORY:  Chronic tear prior to repair  PAIN:  Are you having pain? Yes: NPRS scale: 1/10 Pain location: incision site Pain description: discomfort Aggravating factors: sitting, stretching Relieving factors: ice  PRECAUTIONS:  Avoid hip flexion past 90  RED FLAGS: None   WEIGHT BEARING RESTRICTIONS:  WBAT  FALLS:  Has patient fallen in last 6 months? 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  PATIENT SURVEYS:  HOOS jr. 09/05/23: 8/24 (goal 0/24)  LEFS  Extreme difficulty/unable (0), Quite a bit of difficulty (1), Moderate difficulty (2), Little difficulty (3), No difficulty (4) Survey date:  09/05/23  Any of your usual work, housework or school activities 3  2. Usual hobbies, recreational or sporting activities 1  3. Getting into/out of the bath 3  4. Walking between rooms 4  5. Putting on socks/shoes 3  6. Squatting  2  7. Lifting an object, like a bag of groceries from the floor 2  8. Performing  light activities around your home 2  9. Performing heavy activities around your home 2  10. Getting into/out of a car 3  11. Walking 2 blocks 3  12. Walking 1 mile 3  13. Going up/down 10 stairs (1 flight) 3  14. Standing for 1 hour 2  15.  sitting for 1 hour 2  16. Running on even ground 1  17. Running on uneven ground 1  18. Making sharp turns while running fast 1  19. Hopping  1  20. Rolling over in bed 3  Score total:  45     COGNITIVE STATUS: Within functional limits for tasks assessed   SENSATION: Eval: numbness at surgical site  EDEMA:  No  POSTURE:  No Significant postural limitations   GAIT: Eval: no AD or brace, mild antalgic gait                                                                                                                                 TREATMENT DATE:  09/25/23 Manual: STM and IASTM to distal hamstrings Bridge 10# 3 x 10 Gentle hamstring isometrics 30, 45, 60, then 90 knee flexion 5 x 10 Sidelying hip abduction 2# 3 x 10 SLS with 3 way hip 5 x 5 second holds Squat 3 x 10 Lateral stepping in mini squat GTB at knees 4 x 20 feet Quadruped hip extension donkey kick GTB 3 x 10 Step up 6 inch 2 x 10  09/18/23  Bridge 3 x 10 Gentle hamstring isometrics 30, 45, 60, then 90 knee flexion 5 x 10 second holds each SLR small range 3 x 10 Sidelying hip abduction 2 x 10 SLS 5 x 30 second holds Mini squat 3 x 10 Quadruped hip extension donkey kick 3 x 10 Upright bike 5 minutes Quadruped fire hydrant 3 x 10   [facility] Adult PT Treatment:                                                DATE: 09/11/23 Therapeutic Exercise: Review HEP R sidelying L hip abduction with knee flexed x 20 L SLR hip circles CW/CCW x 20 each Ball squeeze x 20 Prone L hip extension x 20 with glute set Prone alternating hip extension limited ROM x 20 Prone glute set bil x 20 Pelvic tilt x 12 Tilt with ball roll to knees x 20 Tilt with oblique crunch with ball x  20 Tilt with oblique reach supine x 20 Tilt with march x 20 L Quad set x 20 Supine L SLR/hip abduction combo x 15 Standing L hip abduction with knee flexed and extended x 20 with PT palpating for glute med activation Standing bil DF x 20 at counter Sidestepping across floor with PT palpating for glute med activation   Eval: see HEP   PATIENT EDUCATION:  Education details: Anatomy of condition, POC, HEP, exercise form/rationale Person educated: Patient Education method: Explanation, Demonstration, Tactile cues, Verbal  cues, and Handouts Education comprehension: verbalized understanding, returned demonstration, verbal cues required, tactile cues required, and needs further education  HOME EXERCISE PROGRAM: Access Code: URL: [email or web address] Date: 09/05/2023 Prepared by: Harlene Cordon  Exercises - Quadruped Transversus Abdominis Bracing  - 2 x daily - 7 x weekly - 5 sets - 3 breaths hold - Quadruped Hip Extension Kicks  - 2 x daily - 7 x weekly - 2 sets - 10 reps - Sidelying Hip Abduction  - 2 x daily - 7 x weekly - 2 sets - 10 reps - Sidelying Hip Circles  - 2 x daily - 7 x weekly - 2 sets - 10 reps - Supine Straight Leg Raise with ASIS Palpation  - 2 x daily - 7 x weekly - 2 sets - 10 reps - Heel Raise  - 2-3 x daily - 7 x weekly - 2 sets - 10 reps   ASSESSMENT:  CLINICAL IMPRESSION: Hyperactive and tender at distal hamstrings, completed STM with improvement in symptoms following. Continued with hamstring activation and glute strengthening. Great squat mechanics today with out deficits or compensation. Patient will continue to benefit from physical therapy in order to improve function and reduce impairment.     REHAB POTENTIAL: Good  CLINICAL DECISION MAKING: Stable/uncomplicated  EVALUATION COMPLEXITY: Low   GOALS: Goals reviewed with patient? Yes  SHORT TERM GOALS: Target date: 7/14  Full passive motion of hip without insertion  pain Baseline: Goal status: INITIAL  2.  Normalized gait pattern Baseline:  Goal status: INITIAL   LONG TERM GOALS: Target date: POC date  Stretching and isometric activation of hamstrings without insertion pain Baseline:  Goal status: INITIAL  2.  LEFS and HOOS jr to meet ceiling Baseline:  Goal status: INITIAL  3.  Progressing to gym program with understanding of weight increases to protect repair  Baseline:  Goal status: INITIAL  4.  Demo single leg balance static and dynamic without compensation.  Baseline:  Goal status: INITIAL    PLAN:  PT FREQUENCY: 1-2x/week  PT DURATION: POC date  PLANNED INTERVENTIONS: 97164- PT Re-evaluation, 97750- Physical Performance Testing, 97110-Therapeutic exercises, 97530- Therapeutic activity, [id number]- Neuromuscular re-education, 97535- Self Care, 02859- Manual therapy, [phone]- Gait training, [phone]- Aquatic Therapy, [phone] (1-2 muscles), 20561 (3+ muscles)- Dry Needling, Patient/Family education, Balance training, Stair training, Taping, Joint mobilization, Spinal mobilization, Cryotherapy, and Moist heat.  PLAN FOR NEXT SESSION: [place]  St protocol   Prentice GORMAN Stains, PT,DPT 09/25/2023, 9:27 AM

## 2023-10-02 ENCOUNTER — Encounter (HOSPITAL_BASED_OUTPATIENT_CLINIC_OR_DEPARTMENT_OTHER): Payer: Self-pay | Admitting: Physical Therapy

## 2023-10-02 ENCOUNTER — Ambulatory Visit (HOSPITAL_BASED_OUTPATIENT_CLINIC_OR_DEPARTMENT_OTHER): Admitting: Physical Therapy

## 2023-10-02 DIAGNOSIS — S76312S Strain of muscle, fascia and tendon of the posterior muscle group at thigh level, left thigh, sequela: Secondary | ICD-10-CM | POA: Diagnosis not present

## 2023-10-02 DIAGNOSIS — M6281 Muscle weakness (generalized): Secondary | ICD-10-CM

## 2023-10-02 DIAGNOSIS — M25552 Pain in left hip: Secondary | ICD-10-CM

## 2023-10-02 NOTE — Therapy (Signed)
 OUTPATIENT PHYSICAL THERAPY TREATMENT   Patient Name: Cody Cruz MRN: [id number] DOB:[birth year], 48 y.o., male Today's Date: 10/02/2023  END OF SESSION:  PT End of Session - 10/02/23 0848     Visit Number 5    Number of Visits 18    Date for PT Re-Evaluation 11/16/23    PT Start Time 0847    PT Stop Time 0927    PT Time Calculation (min) 40 min    Activity Tolerance Patient tolerated treatment well    Behavior During Therapy [facility] for tasks assessed/performed           Past Medical History:  Diagnosis Date   Chest pain 07/2019   ACS ruled out. Stress test and echo normal.  BP was out of control at the time of his eval/hosp.   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   Essential hypertension    Gout    toes   Hamstring tear    Hyperlipidemia    statins->gout?  ok on pravastatin  2023   Insomnia    Mild intermittent asthma    activity induced, typically.  Allergies trigger as a kid.   Obesity, Class II, BMI 35-39.9    OSA on CPAP    [place] pulm- uses cpap nightly   Plantar fasciitis 01/2019   steroid inj, Dr. Magdalen   Pulmonary nodules 08/2019   incidentally noted on coronary calcium  scoring imaging-> enlarging nodule on 1 yr f/u imaging->pulm eval->LN bx no malignant cells, bronch washings/lavage with some atypical cells favored to be reactive, no malignant cells.  CT 2024 stable.   Past Surgical History:  Procedure Laterality Date   APPENDECTOMY  1989   BRONCHIAL BRUSHINGS  09/27/2020   Procedure: BRONCHIAL BRUSHINGS;  Surgeon: Brenna Adine CROME, DO;  Location: [facility];  Service: Pulmonary;;   BRONCHIAL NEEDLE ASPIRATION BIOPSY  09/27/2020   Procedure: BRONCHIAL NEEDLE ASPIRATION BIOPSIES;  Surgeon: Brenna Adine CROME, DO;   Location: [facility];  Service: Pulmonary;;   BRONCHIAL WASHINGS  09/27/2020   Procedure: BRONCHIAL WASHINGS;  Surgeon: Brenna Adine CROME, DO;  Location: [facility];  Service: Pulmonary;;   CARDIOVASCULAR STRESS TEST  07/31/2019   NO ISCHEMIA   COLONOSCOPY W/ POLYPECTOMY     06/2021, multiple adenomas, recall 3 years   coronary ct  08/2019   calcium  score ZERO.     REPAIR QUADRICEPS/HAMSTRING MUSCLES Left 09/02/2023   Procedure: REPAIR, MUSCLE, QUADRICEPS OR HAMSTRING;  Surgeon: Genelle Standing, MD;  Location: [facility];  Service: Orthopedics;  Laterality: Left;  LEFT PROXIMAL HAMSTRING ENDOSCOPIC REPAIR   TONSILLECTOMY AND ADENOIDECTOMY  1979   TRANSTHORACIC ECHOCARDIOGRAM  07/31/2019   normal   VIDEO BRONCHOSCOPY WITH ENDOBRONCHIAL NAVIGATION Right 09/27/2020   Procedure: VIDEO BRONCHOSCOPY WITH ENDOBRONCHIAL NAVIGATION;  Surgeon: Brenna Adine CROME, DO;  [facility]: [facility];  Service: Pulmonary;  Laterality: Right;   VIDEO BRONCHOSCOPY WITH ENDOBRONCHIAL ULTRASOUND N/A 09/27/2020   Procedure: VIDEO BRONCHOSCOPY WITH ENDOBRONCHIAL ULTRASOUND;  Surgeon: Brenna Adine CROME, DO;  Location: [facility];  Service: Pulmonary;  Laterality: N/A;   WISDOM TOOTH EXTRACTION     Patient Active Problem List   Diagnosis Date Noted   Tear of left hamstring 09/02/2023   Nodule of upper lobe of right lung 09/12/2020   Mediastinal  adenopathy 09/12/2020   Atypical chest pain 07/31/2019   Hypertension 07/31/2019   Chest pain 07/30/2019   S/P right knee arthroscopy 03/29/2017   Hamstring strain, left, sequela 02/15/2017   OSA (obstructive sleep apnea) 10/20/2015   Hypersomnia 07/11/2015   Obesity 07/11/2015   Asthma 07/11/2015   Hyperlipidemia 05/16/2015   Cuboid syndrome of right foot 04/21/2014   Nonallopathic lesion of lower extremities 04/21/2014   Pes planus of both feet 10/30/2013   Health maintenance examination 07/07/2013   Plantar fasciitis, bilateral 07/07/2013    Achilles tendon pain 07/07/2013   Onychomycosis 07/07/2013    REFERRING PROVIDER:  Genelle Standing, MD   REFERRING DIAG:  S76.312S (ICD-10-CM) - Tear of left hamstring, sequela    s/p Lt proximal hamstring repair  Rationale for Evaluation and Treatment: Rehabilitation  THERAPY DIAG:  Tear of left hamstring, sequela  Pain in left hip  Muscle weakness (generalized)  ONSET DATE: 09/02/23 DOS   SUBJECTIVE:                                                                                                                                                                                           SUBJECTIVE STATEMENT: Patient states was sore for about 2 days after last session. Felt manual was helpful. Walking 2-3 miles every other day.  PERTINENT HISTORY:  Chronic tear prior to repair  PAIN:  Are you having pain? Yes: NPRS scale: 0/10 Pain location: incision site Pain description: discomfort Aggravating factors: sitting, stretching Relieving factors: ice  PRECAUTIONS:  Avoid hip flexion past 90  RED FLAGS: None   WEIGHT BEARING RESTRICTIONS:  WBAT  FALLS:  Has patient fallen in last 6 months? 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  PATIENT SURVEYS:  HOOS jr. 09/05/23: 8/24 (goal 0/24)  LEFS  Extreme difficulty/unable (0), Quite a bit of difficulty (1), Moderate difficulty (2), Little difficulty (3), No difficulty (4) Survey date:  09/05/23  Any of your usual work, housework or school activities 3  2. Usual hobbies, recreational or sporting activities 1  3. Getting into/out of the bath 3  4. Walking between rooms 4  5. Putting on socks/shoes 3  6. Squatting  2  7. Lifting an object, like a bag of groceries from the floor 2  8.  Performing light activities around your home 2  9. Performing heavy activities around your home 2  10. Getting into/out of  a car 3  11. Walking 2 blocks 3  12. Walking 1 mile 3  13. Going up/down 10 stairs (1 flight) 3  14. Standing for 1 hour 2  15.  sitting for 1 hour 2  16. Running on even ground 1  17. Running on uneven ground 1  18. Making sharp turns while running fast 1  19. Hopping  1  20. Rolling over in bed 3  Score total:  45     COGNITIVE STATUS: Within functional limits for tasks assessed   SENSATION: Eval: numbness at surgical site  EDEMA:  No  POSTURE:  No Significant postural limitations   GAIT: Eval: no AD or brace, mild antalgic gait                                                                                                                                 TREATMENT DATE:  10/02/23 Upright bike 5 minutes Manual: STM and IASTM to distal hamstrings Bridge 15# 3 x 10 Gentle hamstring isometrics 30, 45, 60, then 90 knee flexion 5 x 10 SLS with 3 way hip 5 x 5 second holds Squat 3 x 10 Step up 6 inch 2 x 10 Lateral step up 6 inch 2 x 10 Prone hip extension with knee bent 3 x 10 Supine heel slides 3 x 10   09/25/23 Manual: STM and IASTM to distal hamstrings Bridge 10# 3 x 10 Gentle hamstring isometrics 30, 45, 60, then 90 knee flexion 5 x 10 Sidelying hip abduction 2# 3 x 10 SLS with 3 way hip 5 x 5 second holds Squat 3 x 10 Lateral stepping in mini squat GTB at knees 4 x 20 feet Quadruped hip extension donkey kick GTB 3 x 10 Step up 6 inch 2 x 10  09/18/23  Bridge 3 x 10 Gentle hamstring isometrics 30, 45, 60, then 90 knee flexion 5 x 10 second holds each SLR small range 3 x 10 Sidelying hip abduction 2 x 10 SLS 5 x 30 second holds Mini squat 3 x 10 Quadruped hip extension donkey kick 3 x 10 Upright bike 5 minutes Quadruped fire hydrant 3 x 10   [facility] Adult PT Treatment:                                                 DATE: 09/11/23 Therapeutic Exercise: Review HEP R sidelying L hip abduction with knee flexed x 20 L SLR hip circles CW/CCW x 20 each Ball squeeze x 20 Prone L hip extension x 20 with glute set Prone alternating hip extension limited ROM x 20 Prone glute set bil x 20 Pelvic tilt x 12 Tilt with ball roll to knees x 20 Tilt with oblique crunch with ball x 20 Tilt with oblique reach supine x 20 Tilt  with march x 20 L Quad set x 20 Supine L SLR/hip abduction combo x 15 Standing L hip abduction with knee flexed and extended x 20 with PT palpating for glute med activation Standing bil DF x 20 at counter Sidestepping across floor with PT palpating for glute med activation   Eval: see HEP   PATIENT EDUCATION:  Education details: Anatomy of condition, POC, HEP, exercise form/rationale Person educated: Patient Education method: Explanation, Demonstration, Tactile cues, Verbal cues, and Handouts Education comprehension: verbalized understanding, returned demonstration, verbal cues required, tactile cues required, and needs further education  HOME EXERCISE PROGRAM: Access Code: URL: [email or web address] Date: 09/05/2023 Prepared by: Harlene Cordon  Exercises - Quadruped Transversus Abdominis Bracing  - 2 x daily - 7 x weekly - 5 sets - 3 breaths hold - Quadruped Hip Extension Kicks  - 2 x daily - 7 x weekly - 2 sets - 10 reps - Sidelying Hip Abduction  - 2 x daily - 7 x weekly - 2 sets - 10 reps - Sidelying Hip Circles  - 2 x daily - 7 x weekly - 2 sets - 10 reps - Supine Straight Leg Raise with ASIS Palpation  - 2 x daily - 7 x weekly - 2 sets - 10 reps - Heel Raise  - 2-3 x daily - 7 x weekly - 2 sets - 10 reps   ASSESSMENT:  CLINICAL IMPRESSION: Bike for dynamic warm up. Patient with continued hyperactive and tender at distal hamstrings, completed STM with improvement in symptoms following. Continued with hamstring activation and glute strengthening  which is tolerated well. Able to progress functional strengthening with additional step up. Patient will continue to benefit from physical therapy in order to improve function and reduce impairment.     REHAB POTENTIAL: Good  CLINICAL DECISION MAKING: Stable/uncomplicated  EVALUATION COMPLEXITY: Low   GOALS: Goals reviewed with patient? Yes  SHORT TERM GOALS: Target date: 7/14  Full passive motion of hip without insertion pain Baseline: Goal status: INITIAL  2.  Normalized gait pattern Baseline:  Goal status: INITIAL   LONG TERM GOALS: Target date: POC date  Stretching and isometric activation of hamstrings without insertion pain Baseline:  Goal status: INITIAL  2.  LEFS and HOOS jr to meet ceiling Baseline:  Goal status: INITIAL  3.  Progressing to gym program with understanding of weight increases to protect repair  Baseline:  Goal status: INITIAL  4.  Demo single leg balance static and dynamic without compensation.  Baseline:  Goal status: INITIAL    PLAN:  PT FREQUENCY: 1-2x/week  PT DURATION: POC date  PLANNED INTERVENTIONS: 97164- PT Re-evaluation, 97750- Physical Performance Testing, 97110-Therapeutic exercises, 97530- Therapeutic activity, [id number]- Neuromuscular re-education, 97535- Self Care, [id number]- Manual therapy, [phone]- Gait training, [phone]- Aquatic Therapy, [phone] (1-2 muscles), [phone] (3+ muscles)- Dry Needling, Patient/Family education, Balance training, Stair training, Taping, Joint mobilization, Spinal mobilization, Cryotherapy, and Moist heat.  PLAN FOR NEXT SESSION: [place]  [facility] protocol   Prentice GORMAN Stains, PT,DPT 10/02/2023, 8:49 AM

## 2023-10-04 ENCOUNTER — Encounter (HOSPITAL_BASED_OUTPATIENT_CLINIC_OR_DEPARTMENT_OTHER): Admitting: Physical Therapy

## 2023-10-05 NOTE — Therapy (Unsigned)
 OUTPATIENT PHYSICAL THERAPY TREATMENT   Patient Name: Cody Cruz MRN: [id number] DOB:1975-06-20, 48 y.o., male Today's Date: 10/09/2023  END OF SESSION:  PT End of Session - 10/09/23 0839     Visit Number 6    Number of Visits 18    Date for PT Re-Evaluation 11/16/23    PT Start Time 0840    PT Stop Time 0920    PT Time Calculation (min) 40 min    Activity Tolerance Patient tolerated treatment well    Behavior During Therapy [facility] for tasks assessed/performed            Past Medical History:  Diagnosis Date   Chest pain 07/2019   ACS ruled out. Stress test and echo normal.  BP was out of control at the time of his eval/hosp.   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   Essential hypertension    Gout    toes   Hamstring tear    Hyperlipidemia    statins->gout?  ok on pravastatin  2023   Insomnia    Mild intermittent asthma    activity induced, typically.  Allergies trigger as a kid.   Obesity, Class II, BMI 35-39.9    OSA on CPAP     pulm- uses cpap nightly   Plantar fasciitis 01/2019   steroid inj, Dr. Magdalen   Pulmonary nodules 08/2019   incidentally noted on coronary calcium  scoring imaging-> enlarging nodule on 1 yr f/u imaging->pulm eval->LN bx no malignant cells, bronch washings/lavage with some atypical cells favored to be reactive, no malignant cells.  CT 2024 stable.   Past Surgical History:  Procedure Laterality Date   APPENDECTOMY  1989   BRONCHIAL BRUSHINGS  09/27/2020   Procedure: BRONCHIAL BRUSHINGS;  Surgeon: Brenna Adine CROME, DO;  [facility]: [facility];  Service: Pulmonary;;   BRONCHIAL NEEDLE ASPIRATION BIOPSY  09/27/2020   Procedure: BRONCHIAL NEEDLE ASPIRATION BIOPSIES;  Surgeon: Brenna Adine CROME, DO;   Location: [facility];  Service: Pulmonary;;   BRONCHIAL WASHINGS  09/27/2020   Procedure: BRONCHIAL WASHINGS;  Surgeon: Brenna Adine CROME, DO;  Location: [facility];  Service: Pulmonary;;   CARDIOVASCULAR STRESS TEST  07/31/2019   NO ISCHEMIA   COLONOSCOPY W/ POLYPECTOMY     06/2021, multiple adenomas, recall 3 years   coronary ct  08/2019   calcium  score ZERO.     REPAIR QUADRICEPS/HAMSTRING MUSCLES Left 09/02/2023   Procedure: REPAIR, MUSCLE, QUADRICEPS OR HAMSTRING;  Surgeon: Genelle Standing, MD;  Location: [facility];  Service: Orthopedics;  Laterality: Left;  LEFT PROXIMAL HAMSTRING ENDOSCOPIC REPAIR   TONSILLECTOMY AND ADENOIDECTOMY  1979   TRANSTHORACIC ECHOCARDIOGRAM  07/31/2019   normal   VIDEO BRONCHOSCOPY WITH ENDOBRONCHIAL NAVIGATION Right 09/27/2020   Procedure: VIDEO BRONCHOSCOPY WITH ENDOBRONCHIAL NAVIGATION;  Surgeon: Brenna Adine CROME, DO;  Location: [facility];  Service: Pulmonary;  Laterality: Right;   VIDEO BRONCHOSCOPY WITH ENDOBRONCHIAL ULTRASOUND N/A 09/27/2020   Procedure: VIDEO BRONCHOSCOPY WITH ENDOBRONCHIAL ULTRASOUND;  Surgeon: Brenna Adine CROME, DO;  Location: [facility];  Service: Pulmonary;  Laterality: N/A;   WISDOM TOOTH EXTRACTION     Patient Active Problem List   Diagnosis Date Noted   Tear of left hamstring 09/02/2023   Nodule of upper lobe of right lung 09/12/2020  Mediastinal adenopathy 09/12/2020   Atypical chest pain 07/31/2019   Hypertension 07/31/2019   Chest pain 07/30/2019   S/P right knee arthroscopy 03/29/2017   Hamstring strain, left, sequela 02/15/2017   OSA (obstructive sleep apnea) 10/20/2015   Hypersomnia 07/11/2015   Obesity 07/11/2015   Asthma 07/11/2015   Hyperlipidemia 05/16/2015   Cuboid syndrome of right foot 04/21/2014   Nonallopathic lesion of lower extremities 04/21/2014   Pes planus of both feet 10/30/2013   Health maintenance examination 07/07/2013   Plantar fasciitis, bilateral 07/07/2013    Achilles tendon pain 07/07/2013   Onychomycosis 07/07/2013    REFERRING PROVIDER:  Genelle Standing, MD   REFERRING DIAG:  S76.312S (ICD-10-CM) - Tear of left hamstring, sequela    s/p Lt proximal hamstring repair  Rationale for Evaluation and Treatment: Rehabilitation  THERAPY DIAG:  Tear of left hamstring, sequela  Pain in left hip  Muscle weakness (generalized)  ONSET DATE: 09/02/23 DOS   SUBJECTIVE:                                                                                                                                                                                           SUBJECTIVE STATEMENT: No complaints today.   PERTINENT HISTORY:  Chronic tear prior to repair  PAIN:  Are you having pain? Yes: NPRS scale: 0/10 Pain location: incision site Pain description: discomfort Aggravating factors: sitting, stretching Relieving factors: ice  PRECAUTIONS:  Avoid hip flexion past 90  RED FLAGS: None   WEIGHT BEARING RESTRICTIONS:  WBAT  FALLS:  Has patient fallen in last 6 months? 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  PATIENT SURVEYS:  HOOS jr. 09/05/23: 8/24 (goal 0/24)  LEFS  Extreme difficulty/unable (0), Quite a bit of difficulty (1), Moderate difficulty (2), Little difficulty (3), No difficulty (4) Survey date:  09/05/23  Any of your usual work, housework or school activities 3  2. Usual hobbies, recreational or sporting activities 1  3. Getting into/out of the bath 3  4. Walking between rooms 4  5. Putting on socks/shoes 3  6. Squatting  2  7. Lifting an object, like a bag of groceries from the floor 2  8. Performing light activities around your home 2  9. Performing heavy activities around your home 2   10. Getting into/out of a car 3  11. Walking 2 blocks 3  12. Walking 1 mile 3  13. Going up/down 10 stairs (1 flight) 3  14. Standing for 1 hour 2  15.  sitting for 1 hour 2  16. Running on even ground 1  17. Running on uneven ground 1  18. Making sharp turns while running fast 1  19. Hopping  1  20. Rolling over in bed 3  Score total:  45     COGNITIVE STATUS: Within functional limits for tasks assessed   SENSATION: Eval: numbness at surgical site  EDEMA:  No  POSTURE:  No Significant postural limitations   GAIT: Eval: no AD or brace, mild antalgic gait                                                                                                                TREATMENT DATE:  10/09/23 Upright bike 5 minutes, 4 lvl  Eccentric SL Bridge 3 x 10 SL stance on airex  SLS with 3 way hip 5 x 5 second holds, 1.5 weight  Squat 3 x 10 Step up 6 inch 2 x 10 Lateral step up 8 inch 2 x 10 Prone hip extension with knee bent 3 x 10 Supine heel slides 3 x 10 Supine heel slides with HS activation.   10/02/23 Upright bike 5 minutes Manual: STM and IASTM to distal hamstrings Bridge 15# 3 x 10 Gentle hamstring isometrics 30, 45, 60, then 90 knee flexion 5 x 10 SLS with 3 way hip 5 x 5 second holds Squat 3 x 10 Step up 6 inch 2 x 10 Lateral step up 6 inch 2 x 10 Prone hip extension with knee bent 3 x 10 Supine heel slides 3 x 10   09/25/23 Manual: STM and IASTM to distal hamstrings Bridge 10# 3 x 10 Gentle hamstring isometrics 30, 45, 60, then 90 knee flexion 5 x 10 Sidelying hip abduction 2# 3 x 10 SLS with 3 way hip 5 x 5 second holds Squat 3 x 10 Lateral stepping in mini squat GTB at knees 4 x 20 feet Quadruped hip extension donkey kick GTB 3 x 10 Step up 6 inch 2 x 10  09/18/23  Bridge 3 x 10 Gentle hamstring isometrics 30, 45, 60, then 90 knee flexion 5 x 10 second holds each SLR small range 3 x 10 Sidelying hip abduction 2 x 10 SLS 5 x 30 second  holds Mini squat 3 x 10 Quadruped hip extension donkey kick 3 x 10 Upright bike 5 minutes Quadruped fire hydrant 3 x 10   [facility] Adult PT Treatment:                                                DATE: 09/11/23 Therapeutic Exercise: Review HEP R sidelying L hip abduction with knee flexed x 20 L SLR hip circles CW/CCW x 20 each Ball squeeze x 20 Prone L hip extension x 20 with glute set Prone  alternating hip extension limited ROM x 20 Prone glute set bil x 20 Pelvic tilt x 12 Tilt with ball roll to knees x 20 Tilt with oblique crunch with ball x 20 Tilt with oblique reach supine x 20 Tilt with march x 20 L Quad set x 20 Supine L SLR/hip abduction combo x 15 Standing L hip abduction with knee flexed and extended x 20 with PT palpating for glute med activation Standing bil DF x 20 at counter Sidestepping across floor with PT palpating for glute med activation   Eval: see HEP   PATIENT EDUCATION:  Education details: Anatomy of condition, POC, HEP, exercise form/rationale Person educated: Patient Education method: Explanation, Demonstration, Tactile cues, Verbal cues, and Handouts Education comprehension: verbalized understanding, returned demonstration, verbal cues required, tactile cues required, and needs further education  HOME EXERCISE PROGRAM: Access Code: URL: [email or web address] Date: 09/05/2023 Prepared by: Harlene Cordon  Exercises - Quadruped Transversus Abdominis Bracing  - 2 x daily - 7 x weekly - 5 sets - 3 breaths hold - Quadruped Hip Extension Kicks  - 2 x daily - 7 x weekly - 2 sets - 10 reps - Sidelying Hip Abduction  - 2 x daily - 7 x weekly - 2 sets - 10 reps - Sidelying Hip Circles  - 2 x daily - 7 x weekly - 2 sets - 10 reps - Supine Straight Leg Raise with ASIS Palpation  - 2 x daily - 7 x weekly - 2 sets - 10 reps - Heel Raise  - 2-3 x daily - 7 x weekly - 2 sets - 10 reps   ASSESSMENT:  CLINICAL IMPRESSION: Bike for  dynamic warm up. Continued with hamstring activation and glute strengthening which is tolerated well. Able to progress functional strengthening with weighted 3 way ankles and increased height with step ups. Patient will continue to benefit from physical therapy in order to improve function and reduce impairment.     REHAB POTENTIAL: Good  CLINICAL DECISION MAKING: Stable/uncomplicated  EVALUATION COMPLEXITY: Low   GOALS: Goals reviewed with patient? Yes  SHORT TERM GOALS: Target date: 7/14  Full passive motion of hip without insertion pain Baseline: Goal status: INITIAL  2.  Normalized gait pattern Baseline:  Goal status: INITIAL   LONG TERM GOALS: Target date: POC date  Stretching and isometric activation of hamstrings without insertion pain Baseline:  Goal status: INITIAL  2.  LEFS and HOOS jr to meet ceiling Baseline:  Goal status: INITIAL  3.  Progressing to gym program with understanding of weight increases to protect repair  Baseline:  Goal status: INITIAL  4.  Demo single leg balance static and dynamic without compensation.  Baseline:  Goal status: INITIAL    PLAN:  PT FREQUENCY: 1-2x/week  PT DURATION: POC date  PLANNED INTERVENTIONS: 97164- PT Re-evaluation, 97750- Physical Performance Testing, 97110-Therapeutic exercises, 97530- Therapeutic activity, [id number]- Neuromuscular re-education, 97535- Self Care, 02859- Manual therapy, [phone]- Gait training, [phone]- Aquatic Therapy, [phone] (1-2 muscles), 20561 (3+ muscles)- Dry Needling, Patient/Family education, Balance training, Stair training, Taping, Joint mobilization, Spinal mobilization, Cryotherapy, and Moist heat.  PLAN FOR NEXT SESSION: [place]  St protocol   Rojean JONELLE Batten, PT,DPT 10/09/2023, 9:22 AM

## 2023-10-09 ENCOUNTER — Encounter (HOSPITAL_BASED_OUTPATIENT_CLINIC_OR_DEPARTMENT_OTHER): Payer: Self-pay | Admitting: Physical Therapy

## 2023-10-09 ENCOUNTER — Ambulatory Visit (HOSPITAL_BASED_OUTPATIENT_CLINIC_OR_DEPARTMENT_OTHER): Admitting: Physical Therapy

## 2023-10-09 DIAGNOSIS — S76312S Strain of muscle, fascia and tendon of the posterior muscle group at thigh level, left thigh, sequela: Secondary | ICD-10-CM | POA: Diagnosis not present

## 2023-10-09 DIAGNOSIS — M6281 Muscle weakness (generalized): Secondary | ICD-10-CM

## 2023-10-09 DIAGNOSIS — M25552 Pain in left hip: Secondary | ICD-10-CM

## 2023-10-11 ENCOUNTER — Encounter (HOSPITAL_BASED_OUTPATIENT_CLINIC_OR_DEPARTMENT_OTHER): Payer: Self-pay

## 2023-10-11 ENCOUNTER — Ambulatory Visit (HOSPITAL_BASED_OUTPATIENT_CLINIC_OR_DEPARTMENT_OTHER): Admitting: Physical Therapy

## 2023-10-14 ENCOUNTER — Ambulatory Visit (INDEPENDENT_AMBULATORY_CARE_PROVIDER_SITE_OTHER): Admitting: Orthopaedic Surgery

## 2023-10-14 DIAGNOSIS — S76312S Strain of muscle, fascia and tendon of the posterior muscle group at thigh level, left thigh, sequela: Secondary | ICD-10-CM

## 2023-10-14 NOTE — Progress Notes (Signed)
 Post Operative Evaluation    Procedure/Date of Surgery: Left hamstring endoscopic repair 6/16  Interval History:    6 weeks status post left proximal hamstring endoscopic repair overall doing extremely well.  Occasionally has some mild pain about his muscular belly although continues to improve.  He is back to golfing no pain   PMH/PSH/Family History/Social History/Meds/Allergies:    Past Medical History:  Diagnosis Date   Chest pain 07/2019   ACS ruled out. Stress test and echo normal.  BP was out of control at the time of his eval/hosp.   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   Essential hypertension    Gout    toes   Hamstring tear    Hyperlipidemia    statins->gout?  ok on pravastatin  2023   Insomnia    Mild intermittent asthma    activity induced, typically.  Allergies trigger as a kid.   Obesity, Class II, BMI 35-39.9    OSA on CPAP    [place] [facility]- uses cpap nightly   Plantar fasciitis 01/2019   steroid inj, Dr. Magdalen   Pulmonary nodules 08/2019   incidentally noted on coronary calcium  scoring imaging-> enlarging nodule on 1 yr f/u imaging->pulm eval->LN bx no malignant cells, bronch washings/lavage with some atypical cells favored to be reactive, no malignant cells.  CT 2024 stable.   Past Surgical History:  Procedure Laterality Date   APPENDECTOMY  1989   BRONCHIAL BRUSHINGS  09/27/2020   Procedure: BRONCHIAL BRUSHINGS;  Surgeon: Brenna Adine CROME, DO;  [facility];  Service: Pulmonary;;   BRONCHIAL NEEDLE ASPIRATION BIOPSY  09/27/2020   Procedure: BRONCHIAL NEEDLE ASPIRATION BIOPSIES;  Surgeon: Brenna Adine CROME, DO;  [facility];  Service: Pulmonary;;   BRONCHIAL WASHINGS   09/27/2020   Procedure: BRONCHIAL WASHINGS;  Surgeon: Brenna Adine CROME, DO;  [facility];  Service: Pulmonary;;   CARDIOVASCULAR STRESS TEST  07/31/2019   NO ISCHEMIA   COLONOSCOPY W/ POLYPECTOMY     06/2021, multiple adenomas, recall 3 years   coronary ct  08/2019   calcium  score ZERO.     REPAIR QUADRICEPS/HAMSTRING MUSCLES Left 09/02/2023   Procedure: REPAIR, MUSCLE, QUADRICEPS OR HAMSTRING;  Surgeon: Genelle Standing, MD;  Location: [facility];  Service: Orthopedics;  Laterality: Left;  LEFT PROXIMAL HAMSTRING ENDOSCOPIC REPAIR   TONSILLECTOMY AND ADENOIDECTOMY  1979   TRANSTHORACIC ECHOCARDIOGRAM  07/31/2019   normal   VIDEO BRONCHOSCOPY WITH ENDOBRONCHIAL NAVIGATION Right 09/27/2020   Procedure: VIDEO BRONCHOSCOPY WITH ENDOBRONCHIAL NAVIGATION;  Surgeon: Brenna Adine CROME, DO;  [facility];  Service: Pulmonary;  Laterality: Right;   VIDEO BRONCHOSCOPY WITH ENDOBRONCHIAL ULTRASOUND N/A 09/27/2020   Procedure: VIDEO BRONCHOSCOPY WITH ENDOBRONCHIAL ULTRASOUND;  Surgeon: Brenna Adine CROME, DO;  [facility];  Service: Pulmonary;  Laterality: N/A;   WISDOM TOOTH EXTRACTION     Social History   Socioeconomic History   Marital status: Married    Spouse name: Not on file   Number of children: Not on file   Years of education: Not on file   Highest education level: Master's  degree (e.g., MA, MS, MEng, MEd, MSW, MBA)  Occupational History   Not on file  Tobacco Use   Smoking status: Never   Smokeless tobacco: Never  Vaping Use   Vaping status: Never Used  Substance and Sexual Activity   Alcohol use: Yes    Comment: 10-14   Drug use: No   Sexual activity: Not on file  Other Topics Concern   Not on file  Social History Narrative   Married, two children (one son and one daughter).   Education: [employer].   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   No tobacco.  Alc 3-4 beers per week avg.  No alc/drugs.    Exercise: CV and wt's at a GYM 3-5 days a week when feet are not hurting him.   Social Drivers of Corporate investment banker Strain: Low Risk  (07/27/2023)   Overall Financial Resource Strain (CARDIA)    Difficulty of Paying Living Expenses: Not very hard  Food Insecurity: No Food Insecurity (07/27/2023)   Hunger Vital Sign    Worried About Running Out of Food in the Last Year: Never true    Ran Out of Food in the Last Year: Never true  Transportation Needs: No Transportation Needs (07/27/2023)   PRAPARE - Administrator, Civil Service (Medical): No    Lack of Transportation (Non-Medical): No  Physical Activity: Insufficiently Active (07/27/2023)   Exercise Vital Sign    Days of Exercise per Week: 2 days    Minutes of Exercise per Session: 30 min  Stress: Stress Concern Present (07/27/2023)   [employer] of Occupational Health - Occupational Stress Questionnaire    Feeling of Stress : To some extent  Social Connections: Moderately Isolated (07/27/2023)   Social Connection and Isolation Panel    Frequency of Communication with Friends and Family: Twice a week    Frequency of Social Gatherings with Friends and Family: Once a week    Attends Religious Services: Never    Database administrator or Organizations: No    Attends Engineer, structural: Not on file    Marital Status: Married   Family History  Problem Relation Age of Onset   Colon polyps Mother    Arthritis Mother    Hyperlipidemia Mother    Hypertension Mother    Colon cancer Father 76   Hypertension Father    CAD Maternal Uncle 33 - 20   Colon cancer Paternal Uncle 34   CAD Maternal Grandfather 88 - 55   Esophageal cancer Neg Hx    Rectal cancer Neg Hx    Stomach cancer Neg Hx    Allergies  Allergen Reactions   Statins Other (See Comments)    ?gout   Current Outpatient Medications  Medication Sig Dispense Refill   albuterol  (VENTOLIN  HFA) 108 (90 Base) MCG/ACT inhaler Inhale 2 puffs  into the lungs every 6 (six) hours as needed for wheezing or shortness of breath. 8 g 0   aspirin  EC 325 MG tablet Take 1 tablet (325 mg total) by mouth daily. (Patient not taking: Reported on 07/31/2023) 14 tablet 0   atorvastatin  (LIPITOR) 80 MG tablet Take 1 tablet (80 mg total) by mouth daily. 90 tablet 1   cetirizine (ZYRTEC) 10 MG chewable tablet Chew 10 mg by mouth daily.     ezetimibe  (ZETIA ) 10 MG tablet Take 1 tablet (10 mg total) by mouth daily. 90 tablet 1   losartan  (COZAAR ) 50 MG tablet Take 1  tablet (50 mg total) by mouth daily. 90 tablet 3   oxyCODONE  (ROXICODONE ) 5 MG immediate release tablet Take 1 tablet (5 mg total) by mouth every 4 (four) hours as needed for severe pain (pain score 7-10) or breakthrough pain. (Patient not taking: Reported on 07/31/2023) 10 tablet 0   tirzepatide  (MOUNJARO ) 2.5 MG/0.5ML Pen Inject 2.5 mg into the skin once a week. 2 mL 0   tirzepatide  (ZEPBOUND ) 2.5 MG/0.5ML Pen Inject 2.5 mg into the skin once a week. 2 mL 0   zolpidem  (AMBIEN ) 10 MG tablet TAKE 1 TABLET BY MOUTH AT BEDTIME AS NEEDED FOR SLEEP. 30 tablet 2   No current facility-administered medications for this visit.   No results found.  Review of Systems:   A ROS was performed including pertinent positives and negatives as documented in the HPI.   Musculoskeletal Exam:    There were no vitals taken for this visit.  Left incisions are well-appearing without erythema or drainage.  He can bend to touch his toes without any pain.  Active forward elevation of the left hip is strong with good abduction strength.  Walks with normal gait  Imaging:      I personally reviewed and interpreted the radiographs.   Assessment:   6 weeks status post left endoscopic hamstring repair overall doing extremely well.  At this time he will be back to full activity.  I have advised that he should return to physical therapy should he feel like he needs some dry needling or acupuncture in the muscular  belly site but in the meantime he will continue to work on his foam roller as well as massage gun  Plan :    - Return to clinic as needed      I personally saw and evaluated the patient, and participated in the management and treatment plan.  Elspeth Parker, MD Attending Physician, Orthopedic Surgery  This document was dictated using Dragon voice recognition software. A reasonable attempt at proof reading has been made to minimize errors.

## 2023-11-13 ENCOUNTER — Encounter: Payer: Self-pay | Admitting: Family Medicine

## 2023-11-13 ENCOUNTER — Other Ambulatory Visit: Payer: Self-pay

## 2023-12-03 ENCOUNTER — Ambulatory Visit (INDEPENDENT_AMBULATORY_CARE_PROVIDER_SITE_OTHER): Admitting: Family Medicine

## 2023-12-03 VITALS — BP 137/85 | HR 48 | Temp 97.8°F | Ht 73.0 in | Wt 310.6 lb

## 2023-12-03 DIAGNOSIS — M10072 Idiopathic gout, left ankle and foot: Secondary | ICD-10-CM

## 2023-12-03 LAB — URIC ACID: Uric Acid, Serum: 6.3 mg/dL (ref 4.0–7.8)

## 2023-12-03 MED ORDER — PREDNISONE 20 MG PO TABS: ORAL_TABLET | ORAL | 0 refills | Status: AC

## 2023-12-03 NOTE — Progress Notes (Signed)
 OFFICE VISIT  12/03/2023  CC:  Chief Complaint  Patient presents with   Gout Flare    Yesterday became unbearable     Patient is a 48 y.o. male who presents for concern of gout.  HPI: For the last couple of weeks he has felt mild to moderate pain in the big toe focused in the MTP joint. In the last few days it built to a severe level and at 1 point yesterday was almost unbearable. Things just brushing against it hurts. He has recurrent episodes of this in the foot, sometimes left and sometimes right. No recent changes in diet.  He avoids high purine foods/drinks. No other joints are bothering him.  No fevers or malaise.  Indocin  has helped in the past but he has none of this.  He has been using some ibuprofen , Tylenol , and Aleve lately. He does not recall ever being on colchicine.  Has never been on allopurinol or Uloric.  Past Medical History:  Diagnosis Date   Chest pain 07/2019   ACS ruled out. Stress test and echo normal.  BP was out of control at the time of his eval/hosp.   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   Essential hypertension    Gout    toes   Hamstring tear    Hyperlipidemia    statins->gout?  ok on pravastatin  2023   Insomnia    Mild intermittent asthma    activity induced, typically.  Allergies trigger as a kid.   Obesity, Class II, BMI 35-39.9    OSA on CPAP    [place] pul[facility]- uses cpap nightly   Plantar fasciitis 01/2019   steroid inj, Dr. Magdalen   Pulmonary nodules 08/2019   incidentally noted on coronary calcium  scoring imaging-> enlarging nodule on 1 yr f/u imaging->pulm eval->LN bx no malignant cells, bronch washings/lavage with some atypical cells favored to be reactive, no malignant cells.  CT 2024 stable.   Sleep  apnea 2017    Past Surgical History:  Procedure Laterality Date   APPENDECTOMY  1989   BRONCHIAL BRUSHINGS  09/27/2020   Procedure: BRONCHIAL BRUSHINGS;  Surgeon: Brenna Adine CROME, DO;  [facility];  Service: Pulmonary;;   BRONCHIAL NEEDLE ASPIRATION BIOPSY  09/27/2020   Procedure: BRONCHIAL NEEDLE ASPIRATION BIOPSIES;  Surgeon: Brenna Adine CROME, DO;  [facility];  Service: Pulmonary;;   BRONCHIAL WASHINGS  09/27/2020   Procedure: BRONCHIAL WASHINGS;  Surgeon: Brenna Adine CROME, DO;  [facility];  Service: Pulmonary;;   CARDIOVASCULAR STRESS TEST  07/31/2019   NO ISCHEMIA   COLONOSCOPY W/ POLYPECTOMY     06/2021, multiple adenomas, recall 3 years   coronary ct  08/2019   calcium  score ZERO.     REPAIR QUADRICEPS/HAMSTRING MUSCLES Left 09/02/2023   Procedure: REPAIR, MUSCLE, QUADRICEPS OR HAMSTRING;  Surgeon: Genelle Standing, MD;  Location: [facility];  Service: Orthopedics;  Laterality: Left;  LEFT PROXIMAL HAMSTRING ENDOSCOPIC REPAIR   TONSILLECTOMY AND ADENOIDECTOMY  1979   TRANSTHORACIC ECHOCARDIOGRAM  07/31/2019   normal   VIDEO BRONCHOSCOPY WITH ENDOBRONCHIAL NAVIGATION Right 09/27/2020   Procedure: VIDEO BRONCHOSCOPY WITH ENDOBRONCHIAL NAVIGATION;  Surgeon: Brenna Adine CROME, DO;  [facility];  Service: Pulmonary;  Laterality: Right;   VIDEO BRONCHOSCOPY WITH ENDOBRONCHIAL  ULTRASOUND N/A 09/27/2020   Procedure: VIDEO BRONCHOSCOPY WITH ENDOBRONCHIAL ULTRASOUND;  Surgeon: Brenna Adine CROME, DO;  [facility];  Service: Pulmonary;  Laterality: N/A;   WISDOM TOOTH EXTRACTION      Outpatient Medications Prior to Visit  Medication Sig Dispense Refill   albuterol  (VENTOLIN  HFA) 108 (90 Base) MCG/ACT inhaler Inhale 2 puffs into the lungs every 6 (six) hours as needed for wheezing or shortness of breath. 8 g 0   aspirin  EC 325 MG tablet Take 1 tablet (325 mg total) by mouth daily. 14 tablet 0   atorvastatin  (LIPITOR) 80 MG  tablet Take 1 tablet (80 mg total) by mouth daily. 90 tablet 1   cetirizine (ZYRTEC) 10 MG chewable tablet Chew 10 mg by mouth daily.     ezetimibe  (ZETIA ) 10 MG tablet Take 1 tablet (10 mg total) by mouth daily. 90 tablet 1   losartan  (COZAAR ) 50 MG tablet Take 1 tablet (50 mg total) by mouth daily. 90 tablet 3   zolpidem  (AMBIEN ) 10 MG tablet TAKE 1 TABLET BY MOUTH AT BEDTIME AS NEEDED FOR SLEEP. 30 tablet 2   oxyCODONE  (ROXICODONE ) 5 MG immediate release tablet Take 1 tablet (5 mg total) by mouth every 4 (four) hours as needed for severe pain (pain score 7-10) or breakthrough pain. (Patient not taking: Reported on 12/03/2023) 10 tablet 0   No facility-administered medications prior to visit.    Allergies  Allergen Reactions   Statins Other (See Comments)    ?gout    Review of Systems  As per HPI  PE:    12/03/2023   11:04 AM 09/02/2023    3:44 PM 09/02/2023    3:15 PM  Vitals with BMI  Height 6' 1    Weight 310 lbs 10 oz    BMI 40.99    Systolic 137 128 874  Diastolic 85 80 69  Pulse 48 60 53     Physical Exam  Gen: Alert, well appearing.  Patient is oriented to person, place, time, and situation. AFFECT: pleasant, lucid thought and speech. Left MTP swollen and tender and with a violaceous discoloration.  No warmth.  He can flex and extend the big toe only minimally.    LABS:  Last CBC Lab Results  Component Value Date   WBC 6.2 08/05/2023   HGB 14.6 08/05/2023   HCT 42.7 08/05/2023   MCV 88.8 08/05/2023   MCH 30.4 07/31/2019   RDW 12.9 08/05/2023   PLT 240.0 08/05/2023   Last metabolic panel Lab Results  Component Value Date   GLUCOSE 101 (H) 08/05/2023   NA 138 08/05/2023   K 4.7 08/05/2023   CL 101 08/05/2023   CO2 30 08/05/2023   BUN 18 08/05/2023   CREATININE 0.98 08/05/2023   GFR 90.25 08/05/2023   CALCIUM  9.1 08/05/2023   PROT 6.7 08/05/2023   ALBUMIN 4.5 08/05/2023   LABGLOB 2.5 09/04/2019   AGRATIO 1.9 09/04/2019   BILITOT 0.7 08/05/2023    ALKPHOS 84 08/05/2023   AST 37 08/05/2023   ALT 55 (H) 08/05/2023   ANIONGAP 9 07/31/2019     Lab Results  Component Value Date   LABURIC 7.3 09/22/2019   IMPRESSION AND PLAN:  #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  Recurrent. Bedside [facility] ultrasound did show synovial thickening and hyperemia at the MTP joint of the left foot.  I could not appreciate any crystals. Prednisone  40 mg a day x 5 days then 20 mg a day x  5 days. He has some oxycodone  leftover from his hamstring surgery a few months ago.  He can use 5 mg every 6 hours as needed. Check uric acid level today.  We will get him on allopurinol in the near future.  An After Visit Summary was printed and given to the patient.  FOLLOW UP: Return in about 1 week (around 12/10/2023) for f/u gout.  Signed:  Gerlene Hockey, MD           12/03/2023

## 2023-12-04 ENCOUNTER — Ambulatory Visit: Payer: Self-pay | Admitting: Family Medicine

## 2023-12-04 DIAGNOSIS — M109 Gout, unspecified: Secondary | ICD-10-CM

## 2023-12-04 DIAGNOSIS — M79675 Pain in left toe(s): Secondary | ICD-10-CM

## 2023-12-09 NOTE — Telephone Encounter (Signed)
 Pls order DG left big toe, dx toe pain, podagra-->at the imaging location of his choice. Return visit with me to discuss toe injection.

## 2023-12-10 ENCOUNTER — Ambulatory Visit (HOSPITAL_BASED_OUTPATIENT_CLINIC_OR_DEPARTMENT_OTHER)
Admission: RE | Admit: 2023-12-10 | Discharge: 2023-12-10 | Disposition: A | Discharge status: Home / Self Care | Source: Ambulatory Visit | Attending: Family Medicine | Admitting: Family Medicine

## 2023-12-10 DIAGNOSIS — M79675 Pain in left toe(s): Secondary | ICD-10-CM | POA: Insufficient documentation

## 2023-12-10 DIAGNOSIS — M109 Gout, unspecified: Secondary | ICD-10-CM | POA: Diagnosis present

## 2023-12-10 NOTE — Telephone Encounter (Signed)
 Okay to keep it for tomorrow

## 2023-12-11 ENCOUNTER — Ambulatory Visit: Admitting: Family Medicine

## 2023-12-11 ENCOUNTER — Encounter: Payer: Self-pay | Admitting: Family Medicine

## 2023-12-11 VITALS — BP 117/76 | HR 59 | Temp 98.0°F | Ht 73.0 in | Wt 309.4 lb

## 2023-12-11 DIAGNOSIS — E785 Hyperlipidemia, unspecified: Secondary | ICD-10-CM

## 2023-12-11 DIAGNOSIS — M1A0720 Idiopathic chronic gout, left ankle and foot, without tophus (tophi): Secondary | ICD-10-CM

## 2023-12-11 DIAGNOSIS — M10072 Idiopathic gout, left ankle and foot: Secondary | ICD-10-CM

## 2023-12-11 MED ORDER — ALLOPURINOL 100 MG PO TABS: 100.0000 mg | ORAL_TABLET | Freq: Every day | ORAL | 6 refills | Status: AC

## 2023-12-11 MED ORDER — TRIAMCINOLONE ACETONIDE 40 MG/ML IJ SUSP
20.0000 mg | Freq: Once | INTRAMUSCULAR | Status: AC
  Administered 2023-12-11: 20 mg via INTRA_ARTICULAR

## 2023-12-11 MED ORDER — PREDNISONE 5 MG PO TABS: ORAL_TABLET | ORAL | 0 refills | Status: DC

## 2023-12-11 NOTE — Progress Notes (Signed)
 OFFICE VISIT  12/11/2023  CC: No chief complaint on file.   Patient is a 48 y.o. male who presents for 8-day follow-up left toe pain. 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  Recurrent. Bedside [facility] ultrasound did show synovial thickening and hyperemia at the MTP joint of the left foot.  I could not appreciate any crystals. Prednisone  40 mg a day x 5 days then 20 mg a day x 5 days. He has some oxycodone  leftover from his hamstring surgery a few months ago.  He can use 5 mg every 6 hours as needed. Check uric acid level today.  INTERIM HX: Prednisone  has been helping the swelling of his toe but has not helped the pain. His toe x-ray upon my review is normal.  Awaiting radiologist over read.  He has had multiple episodes of gout in the feet, particularly the left lately. Uric acid level has been within normal limits. He has never been on any preventative gout medication.  Past Medical History:  Diagnosis Date   Chest pain 07/2019   ACS ruled out. Stress test and echo normal.  BP was out of control at the time of his eval/hosp.   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   Essential hypertension    Gout    toes   Hamstring tear    Hyperlipidemia    statins->gout?  ok on pravastatin  2023   Insomnia    Mild intermittent asthma    activity induced, typically.  Allergies trigger as a kid.   Obesity, Class II, BMI 35-39.9    OSA on CPAP    [place] [facility]- uses cpap nightly   Plantar fasciitis 01/2019   steroid inj, Dr. Magdalen   Pulmonary nodules 08/2019   incidentally noted on coronary calcium  scoring imaging-> enlarging nodule on 1 yr f/u imaging->pulm eval->LN bx no malignant cells, bronch washings/lavage with some atypical  cells favored to be reactive, no malignant cells.  CT 2024 stable.   Sleep apnea 2017    Past Surgical History:  Procedure Laterality Date   APPENDECTOMY  1989   BRONCHIAL BRUSHINGS  09/27/2020   Procedure: BRONCHIAL BRUSHINGS;  Surgeon: Brenna Adine CROME, DO;  [facility];  Service: Pulmonary;;   BRONCHIAL NEEDLE ASPIRATION BIOPSY  09/27/2020   Procedure: BRONCHIAL NEEDLE ASPIRATION BIOPSIES;  Surgeon: Brenna Adine CROME, DO;  [facility];  Service: Pulmonary;;   BRONCHIAL WASHINGS  09/27/2020   Procedure: BRONCHIAL WASHINGS;  Surgeon: Brenna Adine CROME, DO;  [facility];  Service: Pulmonary;;   CARDIOVASCULAR STRESS TEST  07/31/2019   NO ISCHEMIA   COLONOSCOPY W/ POLYPECTOMY     06/2021, multiple adenomas, recall 3 years   coronary ct  08/2019   calcium  score ZERO.     REPAIR QUADRICEPS/HAMSTRING MUSCLES Left 09/02/2023   Procedure: REPAIR, MUSCLE, QUADRICEPS OR HAMSTRING;  Surgeon: Genelle Standing, MD;  Location: [facility];  Service: Orthopedics;  Laterality: Left;  LEFT PROXIMAL HAMSTRING ENDOSCOPIC REPAIR   TONSILLECTOMY AND ADENOIDECTOMY  1979   TRANSTHORACIC ECHOCARDIOGRAM  07/31/2019   normal   VIDEO BRONCHOSCOPY WITH ENDOBRONCHIAL NAVIGATION Right 09/27/2020   Procedure: VIDEO BRONCHOSCOPY WITH ENDOBRONCHIAL NAVIGATION;  Surgeon: Brenna Adine CROME, DO;  [facility];  Service:  Pulmonary;  Laterality: Right;   VIDEO BRONCHOSCOPY WITH ENDOBRONCHIAL ULTRASOUND N/A 09/27/2020   Procedure: VIDEO BRONCHOSCOPY WITH ENDOBRONCHIAL ULTRASOUND;  Surgeon: Brenna Adine CROME, DO;  [facility];  Service: Pulmonary;  Laterality: N/A;   WISDOM TOOTH EXTRACTION      Outpatient Medications Prior to Visit  Medication Sig Dispense Refill   albuterol  (VENTOLIN  HFA) 108 (90 Base) MCG/ACT inhaler Inhale 2 puffs into the lungs every 6 (six) hours as needed for wheezing or shortness of breath. 8 g 0   aspirin  EC 325 MG tablet Take 1  tablet (325 mg total) by mouth daily. 14 tablet 0   atorvastatin  (LIPITOR) 80 MG tablet Take 1 tablet (80 mg total) by mouth daily. 90 tablet 1   cetirizine (ZYRTEC) 10 MG chewable tablet Chew 10 mg by mouth daily.     ezetimibe  (ZETIA ) 10 MG tablet Take 1 tablet (10 mg total) by mouth daily. 90 tablet 1   losartan  (COZAAR ) 50 MG tablet Take 1 tablet (50 mg total) by mouth daily. 90 tablet 3   predniSONE  (DELTASONE ) 20 MG tablet 2 tabs po every day x 5d then 1 tab po every day x 5d 15 tablet 0   zolpidem  (AMBIEN ) 10 MG tablet TAKE 1 TABLET BY MOUTH AT BEDTIME AS NEEDED FOR SLEEP. 30 tablet 2   oxyCODONE  (ROXICODONE ) 5 MG immediate release tablet Take 1 tablet (5 mg total) by mouth every 4 (four) hours as needed for severe pain (pain score 7-10) or breakthrough pain. (Patient not taking: Reported on 12/11/2023) 10 tablet 0   No facility-administered medications prior to visit.    Allergies  Allergen Reactions   Statins Other (See Comments)    ?gout    Review of Systems As per HPI  PE:    12/11/2023    9:29 AM 12/03/2023   11:04 AM 09/02/2023    3:44 PM  Vitals with BMI  Height 6' 1 6' 1   Weight 309 lbs 6 oz 310 lbs 10 oz   BMI 40.83 40.99   Systolic 117 137 871  Diastolic 76 85 80  Pulse 59 48 60     Physical Exam  General: Alert and well-appearing Left MTP swollen, violaceous, and tender--> particularly over the lateral aspect.  The remainder of the toe is without swelling or tenderness.  He can flex and extend the toe minimally.  LABS:  Last CBC Lab Results  Component Value Date   WBC 6.2 08/05/2023   HGB 14.6 08/05/2023   HCT 42.7 08/05/2023   MCV 88.8 08/05/2023   MCH 30.4 07/31/2019   RDW 12.9 08/05/2023   PLT 240.0 08/05/2023   Last metabolic panel Lab Results  Component Value Date   GLUCOSE 101 (H) 08/05/2023   NA 138 08/05/2023   K 4.7 08/05/2023   CL 101 08/05/2023   CO2 30 08/05/2023   BUN 18 08/05/2023   CREATININE 0.98 08/05/2023   GFR 90.25  08/05/2023   CALCIUM  9.1 08/05/2023   PROT 6.7 08/05/2023   ALBUMIN 4.5 08/05/2023   LABGLOB 2.5 09/04/2019   AGRATIO 1.9 09/04/2019   BILITOT 0.7 08/05/2023   ALKPHOS 84 08/05/2023   AST 37 08/05/2023   ALT 55 (H) 08/05/2023   ANIONGAP 9 07/31/2019   Lab Results  Component Value Date   CHOL 147 08/05/2023   HDL 41.80 08/05/2023   LDLCALC 74 08/05/2023   TRIG 156.0 (H) 08/05/2023   CHOLHDL 4 08/05/2023   Lab Results  Component Value Date  LABURIC 6.3 12/03/2023   IMPRESSION AND PLAN:  #1 acute gouty arthritis of left big toe MTP. No improvement in pain with 7 days of prednisone  at this point other than his swelling. Recommended steroid injection and he wanted to go ahead and move forward with this this. Given the recurrent nature of this we will get him started on allopurinol  100 mg a day. He will make a lab visit in 3 weeks for uric acid level. I will extend his steroids--> he currently has 3 days left of his 20 mg daily dose.  I will then do a 10 mg daily dose for 5 days and then at 5 mg daily dose for 5 days.  #2 hypercholesterolemia, tolerating atorvastatin  80 mg a day and Zetia  10 mg a day. His LDL 4 months ago was 74. Continue current regimen. Plan repeat lipids in approximately 3 months.  Ultrasound-guided injection is preferred based on studies that show increased duration, increased effect, greater accuracy, decreased procedural pain, increased response rate, and decreased cost with ultrasound-guided versus blind injection. Procedure: Real-time ultrasound guided injection of left big toe MTP joint. Device: GE [employer] informed consent obtained.  Timeout conducted.  No overlying erythema, induration, or other signs of local infection. After sterile prep with Betadine, injected a mixture of 20 mg Kenalog  and one half a milliliter of 1% lidocaine  without epi.  Injectate seen filling capsule. Patient tolerated the procedure well.  No immediate complications.   Post-injection care discussed. Advised to call if fever/chills, erythema, drainage, or persistent bleeding. Impression: Technically successful ultrasound-guided injection.  An After Visit Summary was printed and given to the patient.  FOLLOW UP: Return if symptoms worsen or fail to improve.  Signed:  Gerlene Hockey, MD           12/11/2023

## 2023-12-11 NOTE — Addendum Note (Signed)
 Addended by: FLETA CARE D on: 12/11/2023 02:20 PM   Modules accepted: Orders

## 2023-12-15 ENCOUNTER — Encounter: Payer: Self-pay | Admitting: Family Medicine

## 2023-12-15 ENCOUNTER — Ambulatory Visit: Payer: Self-pay | Admitting: Family Medicine

## 2023-12-30 ENCOUNTER — Other Ambulatory Visit

## 2024-01-03 ENCOUNTER — Other Ambulatory Visit (INDEPENDENT_AMBULATORY_CARE_PROVIDER_SITE_OTHER)

## 2024-01-03 DIAGNOSIS — E7800 Pure hypercholesterolemia, unspecified: Secondary | ICD-10-CM

## 2024-01-03 LAB — LIPID PANEL
Cholesterol: 140 mg/dL (ref 0–200)
HDL: 34.3 mg/dL — ABNORMAL LOW (ref >39.00)
LDL Cholesterol: 78 mg/dL (ref 0–99)
NonHDL: 105.28
Total CHOL/HDL Ratio: 4
Triglycerides: 135 mg/dL (ref 0.0–149.0)
VLDL: 27 mg/dL (ref 0.0–40.0)

## 2024-01-04 ENCOUNTER — Ambulatory Visit: Payer: Self-pay | Admitting: Family Medicine

## 2024-01-20 ENCOUNTER — Encounter: Payer: Self-pay | Admitting: Radiology

## 2024-02-21 ENCOUNTER — Other Ambulatory Visit: Payer: Self-pay | Admitting: Cardiovascular Disease

## 2024-02-21 ENCOUNTER — Ambulatory Visit: Admitting: Pulmonary Disease

## 2024-02-25 ENCOUNTER — Other Ambulatory Visit: Payer: Self-pay | Admitting: Cardiovascular Disease

## 2024-02-26 MED ORDER — LOSARTAN POTASSIUM 50 MG PO TABS: 50.0000 mg | ORAL_TABLET | Freq: Every day | ORAL | 0 refills | Status: DC

## 2024-02-27 ENCOUNTER — Encounter: Payer: Self-pay | Admitting: Pulmonary Disease

## 2024-02-27 ENCOUNTER — Ambulatory Visit: Admitting: Pulmonary Disease

## 2024-02-27 VITALS — BP 128/81 | HR 68 | Ht 73.0 in | Wt 307.0 lb

## 2024-02-27 DIAGNOSIS — I10 Essential (primary) hypertension: Secondary | ICD-10-CM | POA: Diagnosis not present

## 2024-02-27 DIAGNOSIS — E66813 Obesity, class 3: Secondary | ICD-10-CM

## 2024-02-27 DIAGNOSIS — G4733 Obstructive sleep apnea (adult) (pediatric): Secondary | ICD-10-CM | POA: Diagnosis not present

## 2024-02-27 DIAGNOSIS — G4700 Insomnia, unspecified: Secondary | ICD-10-CM | POA: Diagnosis not present

## 2024-02-27 MED ORDER — ZOLPIDEM TARTRATE 10 MG PO TABS: 10.0000 mg | ORAL_TABLET | Freq: Every evening | ORAL | 3 refills | Status: AC | PRN

## 2024-02-27 NOTE — Patient Instructions (Addendum)
 I will see you back in about a year  [facility] Place refills for your Ambien   Look into cognitive behavioral therapy for management of insomnia  Call us  with significant concerns  Continue weight loss efforts

## 2024-02-27 NOTE — Progress Notes (Signed)
 MAZE CORNIEL    [id number]    [birth year]  Primary Care Physician:McGowen, Aleene DEL, MD  Referring Physician: Candise Aleene DEL, MD [address] [place],  [place] [address]  Chief complaint:   Follow-up for obstructive sleep apnea  Discussed the use of AI scribe software for clinical note transcription with the patient, who gave verbal consent to proceed.  History of Present Illness Cody Cruz is a 48 year old male who presents for a follow-up on CPAP therapy.  He has been using a CPAP machine for two years with settings fluctuating between pressures of seven and twelve, averaging a mean pressure of nine point nine. The machine records approximately zero point five events per hour. He notes a significant improvement from his 2017 sleep study, which showed eighteen point four events per hour. Despite disliking the CPAP, he finds it indispensable for sleep.  He uses Ambien  for sleep about three days a week, finding it effective. His weight has remained stable, and he is actively trying to lose weight. He attempted to obtain a GLP-1 medication for weight loss but was deemed too healthy by his insurance to qualify.  Continues to benefit from CPAP use Does not like it but uses it every night  He is using Ambien , continues to find it beneficial  Did mention CBT I as not means of treating his insomnia-first-line for treatment insomnia  Never smoker   Outpatient Encounter Medications as of 02/27/2024  Medication Sig   albuterol  (VENTOLIN  HFA) 108 (90 Base) MCG/ACT inhaler Inhale 2 puffs into the lungs every 6 (six) hours as needed for wheezing or shortness of breath.   allopurinol  (ZYLOPRIM ) 100 MG tablet Take 1 tablet (100 mg total) by mouth daily.   aspirin  EC 325 MG tablet Take 1 tablet (325 mg total) by mouth daily.   atorvastatin  (LIPITOR) 80 MG tablet Take 1 tablet (80 mg total) by mouth daily.   cetirizine (ZYRTEC) 10 MG chewable tablet Chew 10 mg by mouth  daily.   ezetimibe  (ZETIA ) 10 MG tablet Take 1 tablet (10 mg total) by mouth daily.   losartan  (COZAAR ) 50 MG tablet Take 1 tablet (50 mg total) by mouth daily. Pt must schedule an overdue followup appt with Cardiology for any more refills. [phone] 1st attempt Thank You   predniSONE  (DELTASONE ) 20 MG tablet 2 tabs po every day x 5d then 1 tab po every day x 5d   [DISCONTINUED] zolpidem  (AMBIEN ) 10 MG tablet TAKE 1 TABLET BY MOUTH AT BEDTIME AS NEEDED FOR SLEEP.   zolpidem  (AMBIEN ) 10 MG tablet Take 1 tablet (10 mg total) by mouth at bedtime as needed. for sleep   [DISCONTINUED] oxyCODONE  (ROXICODONE ) 5 MG immediate release tablet Take 1 tablet (5 mg total) by mouth every 4 (four) hours as needed for severe pain (pain score 7-10) or breakthrough pain. (Patient not taking: Reported on 12/11/2023)   [DISCONTINUED] predniSONE  (DELTASONE ) 5 MG tablet 2 tabs po every day x 5d then 1 tab po every day x 5d   No facility-administered encounter medications on file as of 02/27/2024.    Allergies as of 02/27/2024 - Review Complete 12/11/2023  Allergen Reaction Noted   Statins Other (See Comments) 11/11/2020    Past Medical History:  Diagnosis Date   Chest pain 07/2019   ACS ruled out. Stress test and echo normal.  BP was out of control at the time of his eval/hosp.   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   Essential hypertension    Gout    toes   Hamstring tear    Hyperlipidemia    statins->gout?  ok on pravastatin  2023   Insomnia    Mild intermittent asthma    activity induced, typically.  Allergies trigger as a kid.   Obesity, Class II, BMI 35-39.9    OSA on CPAP    [facility]- uses cpap nightly   Osteoarthritis    MTP   Plantar fasciitis 01/2019    steroid inj, Dr. Magdalen   Pulmonary nodules 08/2019   incidentally noted on coronary calcium  scoring imaging-> enlarging nodule on 1 yr f/u imaging->pulm eval->LN bx no malignant cells, bronch washings/lavage with some atypical cells favored to be reactive, no malignant cells.  CT 2024 stable.   Sleep apnea 2017    Past Surgical History:  Procedure Laterality Date   APPENDECTOMY  1989   BRONCHIAL BRUSHINGS  09/27/2020   Procedure: BRONCHIAL BRUSHINGS;  Surgeon: Brenna Adine CROME, DO;  Location: [facility];  Service: Pulmonary;;   BRONCHIAL NEEDLE ASPIRATION BIOPSY  09/27/2020   Procedure: BRONCHIAL NEEDLE ASPIRATION BIOPSIES;  Surgeon: Brenna Adine CROME, DO;  Location: [facility];  Service: Pulmonary;;   BRONCHIAL WASHINGS  09/27/2020   Procedure: BRONCHIAL WASHINGS;  Surgeon: Brenna Adine CROME, DO;  Location: [facility];  Service: Pulmonary;;   CARDIOVASCULAR STRESS TEST  07/31/2019   NO ISCHEMIA   COLONOSCOPY W/ POLYPECTOMY     06/2021, multiple adenomas, recall 3 years   coronary ct  08/2019   calcium  score ZERO.     REPAIR QUADRICEPS/HAMSTRING MUSCLES Left 09/02/2023   Procedure: REPAIR, MUSCLE, QUADRICEPS OR HAMSTRING;  Surgeon: Genelle Standing, MD;  Location[facility];  Service: Orthopedics;  Laterality: Left;  LEFT PROXIMAL HAMSTRING ENDOSCOPIC REPAIR   TONSILLECTOMY AND ADENOIDECTOMY  1979   TRANSTHORACIC ECHOCARDIOGRAM  07/31/2019   normal   VIDEO BRONCHOSCOPY WITH ENDOBRONCHIAL NAVIGATION Right 09/27/2020   Procedure: VIDEO BRONCHOSCOPY WITH ENDOBRONCHIAL NAVIGATION;  Surgeon: Brenna Adine CROME, DO;  Location: [facility];  Service: Pulmonary;  Laterality: Right;   VIDEO BRONCHOSCOPY WITH ENDOBRONCHIAL ULTRASOUND N/A 09/27/2020   Procedure: VIDEO BRONCHOSCOPY WITH ENDOBRONCHIAL ULTRASOUND;  Surgeon: Brenna Adine CROME, DO;  Location: [facility];  Service: Pulmonary;  Laterality: N/A;   WISDOM TOOTH EXTRACTION      Family History  Problem Relation Age of  Onset   Colon polyps Mother    Arthritis Mother    Hyperlipidemia Mother    Hypertension Mother    Colon cancer Father 37   Hypertension Father    Obesity Father    CAD Maternal Uncle 24 - 16   Colon cancer Paternal Uncle 41   CAD Maternal Grandfather 84 - 44   COPD Maternal Grandmother    Diabetes Paternal Grandmother    Esophageal cancer Neg Hx    Rectal cancer Neg Hx    Stomach cancer Neg Hx     Social History   Socioeconomic History   Marital status: Married    Spouse name: Not on file   Number of children: Not on file   Years of education: Not on file   Highest education level: Master's degree (e.g., MA, MS, MEng, MEd, MSW, MBA)  Occupational History  Not on file  Tobacco Use   Smoking status: Never   Smokeless tobacco: Never  Vaping Use   Vaping status: Never Used  Substance and Sexual Activity   Alcohol use: Yes    Comment: 10-14   Drug use: No   Sexual activity: Not on file  Other Topics Concern   Not on file  Social History Narrative   Married, two children (one son and one daughter).   Education: [employer].   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   No tobacco.  Alc 3-4 beers per week avg.  No alc/drugs.   Exercise: CV and wt's at a GYM 3-5 days a week when feet are not hurting him.   Social Drivers of Health   Tobacco Use: Low Risk (02/27/2024)   Patient History    Smoking Tobacco Use: Never    Smokeless Tobacco Use: Never    Passive Exposure: Not on file  Financial Resource Strain: Low Risk (12/03/2023)   Overall Financial Resource Strain (CARDIA)    Difficulty of Paying Living Expenses: Not hard at all  Food Insecurity: No Food Insecurity (12/03/2023)   Epic    Worried About Programme Researcher, Broadcasting/film/video in the Last Year: Never true    Ran Out of Food in the Last Year: Never true  Transportation Needs: No Transportation Needs (12/03/2023)   Epic    Lack of Transportation (Medical): No    Lack of Transportation (Non-Medical): No   Physical Activity: Sufficiently Active (12/03/2023)   Exercise Vital Sign    Days of Exercise per Week: 5 days    Minutes of Exercise per Session: 60 min  Stress: No Stress Concern Present (12/03/2023)   [employer] of Occupational Health - Occupational Stress Questionnaire    Feeling of Stress: Only a little  Social Connections: Moderately Isolated (12/03/2023)   Social Connection and Isolation Panel    Frequency of Communication with Friends and Family: Three times a week    Frequency of Social Gatherings with Friends and Family: Three times a week    Attends Religious Services: Never    Active Member of Clubs or Organizations: No    Attends Banker Meetings: Not on file    Marital Status: Married  Catering Manager Violence: Not on file  Depression (PHQ2-9): Low Risk (07/31/2023)   Depression (PHQ2-9)    PHQ-2 Score: 0  Alcohol Screen: Low Risk (12/03/2023)   Alcohol Screen    Last Alcohol Screening Score (AUDIT): 4  Housing: Low Risk (12/03/2023)   Epic    Unable to Pay for Housing in the Last Year: No    Number of Times Moved in the Last Year: 0    Homeless in the Last Year: No  Utilities: Not on file  Health Literacy: Not on file    Review of Systems  Respiratory:  Positive for apnea.   Psychiatric/Behavioral:  Positive for sleep disturbance.     Vitals:   02/27/24 1040  BP: 128/81  Pulse: 68  SpO2: 94%     Physical Exam Constitutional:      Appearance: He is obese.  HENT:     Head: Normocephalic.     Nose: Nose normal.     Mouth/Throat:     Mouth: Mucous membranes are moist.  Eyes:     Pupils: Pupils are equal, round, and reactive to light.  Cardiovascular:     Rate and Rhythm: Normal rate and regular rhythm.     Heart sounds: No murmur  heard.    No friction rub.  Pulmonary:     Effort: No respiratory distress.     Breath sounds: No stridor. No wheezing or rhonchi.  Musculoskeletal:     Cervical back: No rigidity or tenderness.   Neurological:     Mental Status: He is alert.  Psychiatric:        Mood and Affect: Mood normal.    Data Reviewed: Previous sleep study was reviewed showing moderate obstructive sleep apnea of 18.4 on 08/02/2015  CPAP download shows excellent compliance at 100% Average use of 7 hours 45 minutes AutoSet 7-12 95 percentile pressure of 9.9 with a residual AHI of 0.5  Assessment and Plan Assessment & Plan Obstructive sleep apnea Well-managed with CPAP therapy. He reports sleeping approximately 7 hours and 45 minutes per night with a CPAP machine set to fluctuate between 7 and 12 cm H2O, achieving a mean pressure of 9.9 cm H2O. The mask is not leaking significantly, and the number of apneic events has decreased from 18.4 events per hour in 2017 to 0.5 events per hour, indicating effective treatment. He is currently leasing the CPAP machine, which is functioning well after two years of use. - Continue current CPAP therapy settings. - Provided prescription for current CPAP mask for potential purchase outside of insurance constraints.  Chronic insomnia Managed with Ambien , which he reports is effective. He uses Ambien  approximately three days a week. There is no evidence suggesting one medication is superior to another for insomnia, and cognitive behavioral therapy is recommended as the first-line treatment. He is advised to use Ambien  for a limited amount of time and to consider taking breaks from it when possible. - Continue Ambien  as currently prescribed. - Provided information on cognitive behavioral therapy for insomnia. - Advised to use Ambien  for a limited amount of time and consider breaks when possible.  Class III obesity - Encourage weight loss efforts  Follow-up a year from now Encouraged to give us  a call with any significant concerns  Refills for his Ambien  were placed  Hypertension - Well-controlled  Past history of mediastinal adenopathy - Stable  Jennet Epley  MD [facility] 02/27/2024, 10:53 AM  CC: McGowen, Aleene DEL, MD

## 2024-04-01 ENCOUNTER — Other Ambulatory Visit: Payer: Self-pay | Admitting: Cardiovascular Disease

## 2024-05-11 ENCOUNTER — Ambulatory Visit: Admitting: Cardiovascular Disease
